# Patient Record
Sex: Female | Born: 1945 | Race: White | Hispanic: No | Marital: Married | Smoking: Current every day smoker
Health system: Southern US, Community
[De-identification: ages and names within clinical notes are randomized; demographics above are authoritative.]

## PROBLEM LIST (undated history)

## (undated) DIAGNOSIS — Z789 Other specified health status: Secondary | ICD-10-CM

## (undated) HISTORY — PX: FOOT SURGERY: SHX648

## (undated) HISTORY — PX: BACK SURGERY: SHX140

---

## 1999-08-22 ENCOUNTER — Other Ambulatory Visit: Admission: RE | Admit: 1999-08-22 | Discharge: 1999-08-22 | Payer: Self-pay | Admitting: Obstetrics and Gynecology

## 2001-03-04 ENCOUNTER — Other Ambulatory Visit: Admission: RE | Admit: 2001-03-04 | Discharge: 2001-03-04 | Payer: Self-pay | Admitting: Obstetrics and Gynecology

## 2002-02-18 ENCOUNTER — Other Ambulatory Visit: Admission: RE | Admit: 2002-02-18 | Discharge: 2002-02-18 | Payer: Self-pay | Admitting: Obstetrics and Gynecology

## 2003-05-13 ENCOUNTER — Other Ambulatory Visit: Admission: RE | Admit: 2003-05-13 | Discharge: 2003-05-13 | Payer: Self-pay | Admitting: Obstetrics and Gynecology

## 2003-12-19 ENCOUNTER — Ambulatory Visit (HOSPITAL_COMMUNITY): Admission: RE | Admit: 2003-12-19 | Discharge: 2003-12-19 | Payer: Self-pay | Admitting: Neurosurgery

## 2006-03-26 ENCOUNTER — Other Ambulatory Visit: Admission: RE | Admit: 2006-03-26 | Discharge: 2006-03-26 | Payer: Self-pay | Admitting: Obstetrics and Gynecology

## 2008-07-08 ENCOUNTER — Other Ambulatory Visit: Admission: RE | Admit: 2008-07-08 | Discharge: 2008-07-08 | Payer: Self-pay | Admitting: Internal Medicine

## 2012-08-12 ENCOUNTER — Other Ambulatory Visit: Payer: Self-pay | Admitting: Chiropractic Medicine

## 2012-08-12 DIAGNOSIS — M5430 Sciatica, unspecified side: Secondary | ICD-10-CM

## 2012-08-17 ENCOUNTER — Ambulatory Visit
Admission: RE | Admit: 2012-08-17 | Discharge: 2012-08-17 | Disposition: A | Discharge status: Home / Self Care | Payer: BC Managed Care – PPO | Source: Ambulatory Visit | Attending: Chiropractic Medicine | Admitting: Chiropractic Medicine

## 2012-08-17 DIAGNOSIS — M5430 Sciatica, unspecified side: Secondary | ICD-10-CM

## 2012-08-17 MED ORDER — GADOBENATE DIMEGLUMINE 529 MG/ML IV SOLN
17.0000 mL | Freq: Once | INTRAVENOUS | Status: AC | PRN
  Administered 2012-08-17: 17 mL via INTRAVENOUS

## 2013-09-22 ENCOUNTER — Ambulatory Visit (INDEPENDENT_AMBULATORY_CARE_PROVIDER_SITE_OTHER): Payer: BC Managed Care – PPO

## 2013-09-22 ENCOUNTER — Ambulatory Visit: Payer: Self-pay

## 2013-09-22 ENCOUNTER — Ambulatory Visit (INDEPENDENT_AMBULATORY_CARE_PROVIDER_SITE_OTHER): Payer: BC Managed Care – PPO | Admitting: Podiatry

## 2013-09-22 VITALS — BP 134/74 | HR 77 | Resp 16 | Wt 206.0 lb

## 2013-09-22 DIAGNOSIS — M2010 Hallux valgus (acquired), unspecified foot: Secondary | ICD-10-CM

## 2013-09-22 DIAGNOSIS — Z9889 Other specified postprocedural states: Secondary | ICD-10-CM

## 2013-09-22 DIAGNOSIS — M21611 Bunion of right foot: Secondary | ICD-10-CM

## 2013-09-22 DIAGNOSIS — M21619 Bunion of unspecified foot: Secondary | ICD-10-CM

## 2013-09-22 NOTE — Progress Notes (Signed)
Subjective:     Patient ID: Kelsey Jacobs, female   DOB: 02/11/1946, 67 y.o.   MRN: [id number]  HPI patient states that her right foot is doing well with swelling still noted when she stands too long but able to wear shoe gear comfortably   Review of Systems  All other systems reviewed and are negative.       Objective:   Physical Exam  Nursing note and vitals reviewed. Constitutional: She appears well-developed and well-nourished.  Cardiovascular: Intact distal pulses.   Neurological: She is alert.   right foot has moderate edema consistent with this. Postop with good range of motion and no structural deformity noted. Left foot shows structural deformity of the MPJ    Assessment:     Well-healed surgical site right with good clinical picture and structural deformity of the left first metatarsal and distal third and fourth toes    Plan:     Reviewed x-rays and advised on moderate activity for the right foot and discussed correction of the left foot to be done in January

## 2013-11-22 ENCOUNTER — Encounter: Payer: BC Managed Care – PPO | Admitting: Podiatry

## 2013-12-06 ENCOUNTER — Encounter: Payer: Self-pay | Admitting: Podiatry

## 2013-12-06 ENCOUNTER — Ambulatory Visit (INDEPENDENT_AMBULATORY_CARE_PROVIDER_SITE_OTHER): Payer: BC Managed Care – PPO

## 2013-12-06 ENCOUNTER — Ambulatory Visit (INDEPENDENT_AMBULATORY_CARE_PROVIDER_SITE_OTHER): Payer: BC Managed Care – PPO | Admitting: Podiatry

## 2013-12-06 VITALS — BP 164/84 | HR 71 | Resp 16

## 2013-12-06 DIAGNOSIS — L84 Corns and callosities: Secondary | ICD-10-CM

## 2013-12-06 DIAGNOSIS — M2010 Hallux valgus (acquired), unspecified foot: Secondary | ICD-10-CM

## 2013-12-06 DIAGNOSIS — Z9889 Other specified postprocedural states: Secondary | ICD-10-CM

## 2013-12-06 NOTE — Progress Notes (Signed)
Subjective:     Patient ID: Paulene Floor, female   DOB: 01/02/1946, 67 y.o.   MRN: [id number]  HPI patient states my right foot doing very well and I want to fix my left foot sometime in the summer but I have this lesion underneath it very tender on the left foot and I wanted to get that checked.   Review of Systems     Objective:   Physical Exam Neurovascular status intact with no health history changes noted and well-healing surgical site right with reasonably good clinical alignment no pain good motion and reduction of callus tissue. Left shows structural malalignment of the forefoot with digital deformity structural bunion deformity and pain with keratotic lesion around the second metatarsal left    Assessment:     Healing well from surgery right even though I was not able to get complete radiographic healing and structural malalignment of the left with lesion formation and pain    Plan:     Reviewed x-rays of both conditions and debrided lesion on the left. Discussed that I would like to use internal pins on the second and third toes but I do think it's important to stabilize them to try to prevent deformity underneath the metatarsal with lesion. For the right foot resume normal activities

## 2014-04-20 ENCOUNTER — Ambulatory Visit: Payer: BC Managed Care – PPO | Admitting: Podiatry

## 2014-04-27 ENCOUNTER — Ambulatory Visit (INDEPENDENT_AMBULATORY_CARE_PROVIDER_SITE_OTHER): Payer: BC Managed Care – PPO | Admitting: Podiatry

## 2014-04-27 ENCOUNTER — Ambulatory Visit (INDEPENDENT_AMBULATORY_CARE_PROVIDER_SITE_OTHER): Payer: BC Managed Care – PPO

## 2014-04-27 ENCOUNTER — Encounter: Payer: Self-pay | Admitting: Podiatry

## 2014-04-27 VITALS — BP 131/80 | HR 87 | Resp 16

## 2014-04-27 DIAGNOSIS — L851 Acquired keratosis [keratoderma] palmaris et plantaris: Secondary | ICD-10-CM

## 2014-04-27 DIAGNOSIS — M2040 Other hammer toe(s) (acquired), unspecified foot: Secondary | ICD-10-CM

## 2014-04-27 DIAGNOSIS — R52 Pain, unspecified: Secondary | ICD-10-CM

## 2014-04-27 DIAGNOSIS — M2010 Hallux valgus (acquired), unspecified foot: Secondary | ICD-10-CM

## 2014-04-27 MED ORDER — DIAZEPAM 10 MG PO TABS: 10.0000 mg | ORAL_TABLET | Freq: Two times a day (BID) | ORAL | Status: DC | PRN

## 2014-04-27 NOTE — Progress Notes (Signed)
   Subjective:    Patient ID: Kelsey Jacobs, female    DOB: 13-Dec-1945, 68 y.o.   MRN: [id number]  HPI  Pain in left foot, surgical consult.    Review of Systems     Objective:   Physical Exam        Assessment & Plan:

## 2014-04-27 NOTE — Progress Notes (Signed)
Subjective:     Patient ID: Kelsey Jacobs, female   DOB: 08/21/1946, 68 y.o.   MRN: [id number]  HPI patient presents stating I am ready to set up surgery for my left foot and i'm feeling very good with my right foot   Review of Systems     Objective:   Physical Exam Neurovascular status intact with no health history changes noted and patient found to have a large structural bunion deformity left with redness digital deformity of digits 23 and 4 and plantar keratotic lesion left second metatarsal but painful. 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 Discussed surgery and recommended Austin osteotomy with in fixation digital fusion digits 23 left distal arthroplasty 4 left and removal of the plantar lesion left with wide excision. Explained that is a possibility the lesion will come back and I can give no guarantees for success of surgery allowing her to read a consent form reviewing what would be done and all complications as outlined in the consent form. Patient understands total surgery will take 4-6 months

## 2014-05-18 ENCOUNTER — Telehealth: Payer: Self-pay | Admitting: *Deleted

## 2014-05-18 NOTE — Telephone Encounter (Signed)
Surgery scheduled for 06/28/2014.  I need to change that.  Give me a call.  I'd appreciate it.  I attempted to return her call.  I left her a message on her work number to call me back on tomorrow.  I also attempted to call her home number, no voicemail.

## 2014-05-19 NOTE — Telephone Encounter (Signed)
I called the patient and asked if she wanted to reschedule her surgery.  She said she wanted to wait until the winter because she had the other one done when it was hot.  Don't want to go through that again.  She said she wants to have it done in January.  I told her to give [place] a call at the end of November and probably schedule a follow-up appointment.  She stated she would do that.

## 2014-07-04 ENCOUNTER — Encounter: Payer: BC Managed Care – PPO | Admitting: Podiatry

## 2016-05-29 ENCOUNTER — Ambulatory Visit (INDEPENDENT_AMBULATORY_CARE_PROVIDER_SITE_OTHER): Payer: Commercial Managed Care - HMO

## 2016-05-29 ENCOUNTER — Ambulatory Visit (INDEPENDENT_AMBULATORY_CARE_PROVIDER_SITE_OTHER): Payer: Commercial Managed Care - HMO | Admitting: Podiatry

## 2016-05-29 ENCOUNTER — Encounter: Payer: Self-pay | Admitting: Podiatry

## 2016-05-29 VITALS — BP 142/84 | HR 74 | Resp 16

## 2016-05-29 DIAGNOSIS — M2040 Other hammer toe(s) (acquired), unspecified foot: Secondary | ICD-10-CM

## 2016-05-29 DIAGNOSIS — M779 Enthesopathy, unspecified: Secondary | ICD-10-CM

## 2016-05-29 MED ORDER — TRIAMCINOLONE ACETONIDE 10 MG/ML IJ SUSP
10.0000 mg | Freq: Once | INTRAMUSCULAR | Status: AC
  Administered 2016-05-29: 10 mg

## 2016-05-29 NOTE — Progress Notes (Signed)
Subjective:     Patient ID: Kelsey Jacobs M Jacobs, female   DOB: 01-17-1946, 70 y.o.   MRN: [id number]  HPI patient points to the outside of the right foot stating that it's been hurting her for the last couple months and she does not remember injury. Overall is satisfied with the correction of her right foot was concerned about the position of the big toe   Review of Systems     Objective:   Physical Exam Neurovascular status found to be intact muscle strength was adequate with patient having a good structural correction of the right foot with excellent range of motion of the first MPJ. Patient's found on the lateral side of the foot to have pain in the dorsal tendon complex and fluid buildup. 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 Gave instructions on physical therapy.  X-ray report indicates that there is relative good correction of the first MPJ right with excellent correction of the second digit and no indication of the hallux coming up over the second toe

## 2016-07-03 ENCOUNTER — Ambulatory Visit: Payer: Commercial Managed Care - HMO | Admitting: Podiatry

## 2016-07-10 ENCOUNTER — Ambulatory Visit (INDEPENDENT_AMBULATORY_CARE_PROVIDER_SITE_OTHER): Payer: Commercial Managed Care - HMO | Admitting: Podiatry

## 2016-07-10 ENCOUNTER — Encounter: Payer: Self-pay | Admitting: Podiatry

## 2016-07-10 DIAGNOSIS — M779 Enthesopathy, unspecified: Secondary | ICD-10-CM | POA: Diagnosis not present

## 2016-07-10 DIAGNOSIS — M2040 Other hammer toe(s) (acquired), unspecified foot: Secondary | ICD-10-CM | POA: Diagnosis not present

## 2016-07-10 NOTE — Progress Notes (Signed)
Subjective:     Patient ID: Kelsey Jacobs, female   DOB: 06-Jul-1946, 70 y.o.   MRN: [id number]  HPI patient presents stating I'm doing fine with the area worked on but at times my big toe bothers me and I know that I do it myself   Review of Systems     Objective:   Physical Exam Neurovascular status intact muscle strength adequate with patient found to have improvement of the right lateral foot with diminishment of discomfort when palpated. The right hallux does tend to move up and slightly over and she only gets this when she activates her extensor tendon as a nervous habit    Assessment:     Doing well from tendinitis right with a mild over activity of the extensor tendon right with the hallux that at times comes over the second toe but when she relaxes it it's in good position    Plan:     Reviewed condition and do not recommend procedure on the big toe and less it were to get worse or we may have to consider fusion or other procedures. Tendinitis seems to be under control and will be seen back as needed

## 2017-09-12 DIAGNOSIS — S82831A Other fracture of upper and lower end of right fibula, initial encounter for closed fracture: Secondary | ICD-10-CM | POA: Diagnosis not present

## 2017-09-19 DIAGNOSIS — S82831D Other fracture of upper and lower end of right fibula, subsequent encounter for closed fracture with routine healing: Secondary | ICD-10-CM | POA: Diagnosis not present

## 2017-10-06 DIAGNOSIS — S82831D Other fracture of upper and lower end of right fibula, subsequent encounter for closed fracture with routine healing: Secondary | ICD-10-CM | POA: Diagnosis not present

## 2017-10-27 DIAGNOSIS — S82831D Other fracture of upper and lower end of right fibula, subsequent encounter for closed fracture with routine healing: Secondary | ICD-10-CM | POA: Diagnosis not present

## 2017-11-24 DIAGNOSIS — S82831D Other fracture of upper and lower end of right fibula, subsequent encounter for closed fracture with routine healing: Secondary | ICD-10-CM | POA: Diagnosis not present

## 2017-12-15 DIAGNOSIS — S82831D Other fracture of upper and lower end of right fibula, subsequent encounter for closed fracture with routine healing: Secondary | ICD-10-CM | POA: Diagnosis not present

## 2019-02-05 DIAGNOSIS — M4326 Fusion of spine, lumbar region: Secondary | ICD-10-CM | POA: Diagnosis not present

## 2019-02-05 DIAGNOSIS — M9903 Segmental and somatic dysfunction of lumbar region: Secondary | ICD-10-CM | POA: Diagnosis not present

## 2019-02-05 DIAGNOSIS — M7918 Myalgia, other site: Secondary | ICD-10-CM | POA: Diagnosis not present

## 2019-02-05 DIAGNOSIS — M5416 Radiculopathy, lumbar region: Secondary | ICD-10-CM | POA: Diagnosis not present

## 2019-02-05 DIAGNOSIS — M4726 Other spondylosis with radiculopathy, lumbar region: Secondary | ICD-10-CM | POA: Diagnosis not present

## 2019-02-05 DIAGNOSIS — M9904 Segmental and somatic dysfunction of sacral region: Secondary | ICD-10-CM | POA: Diagnosis not present

## 2019-02-05 DIAGNOSIS — M545 Low back pain: Secondary | ICD-10-CM | POA: Diagnosis not present

## 2019-02-05 DIAGNOSIS — M9905 Segmental and somatic dysfunction of pelvic region: Secondary | ICD-10-CM | POA: Diagnosis not present

## 2019-02-12 DIAGNOSIS — M5416 Radiculopathy, lumbar region: Secondary | ICD-10-CM | POA: Diagnosis not present

## 2019-02-12 DIAGNOSIS — M9904 Segmental and somatic dysfunction of sacral region: Secondary | ICD-10-CM | POA: Diagnosis not present

## 2019-02-12 DIAGNOSIS — M9903 Segmental and somatic dysfunction of lumbar region: Secondary | ICD-10-CM | POA: Diagnosis not present

## 2019-02-12 DIAGNOSIS — M4326 Fusion of spine, lumbar region: Secondary | ICD-10-CM | POA: Diagnosis not present

## 2019-02-12 DIAGNOSIS — M7918 Myalgia, other site: Secondary | ICD-10-CM | POA: Diagnosis not present

## 2019-02-12 DIAGNOSIS — M9905 Segmental and somatic dysfunction of pelvic region: Secondary | ICD-10-CM | POA: Diagnosis not present

## 2019-02-12 DIAGNOSIS — M4726 Other spondylosis with radiculopathy, lumbar region: Secondary | ICD-10-CM | POA: Diagnosis not present

## 2019-02-12 DIAGNOSIS — M545 Low back pain: Secondary | ICD-10-CM | POA: Diagnosis not present

## 2019-12-29 DIAGNOSIS — R5382 Chronic fatigue, unspecified: Secondary | ICD-10-CM | POA: Diagnosis not present

## 2019-12-29 DIAGNOSIS — H539 Unspecified visual disturbance: Secondary | ICD-10-CM | POA: Diagnosis not present

## 2019-12-29 DIAGNOSIS — J301 Allergic rhinitis due to pollen: Secondary | ICD-10-CM | POA: Diagnosis not present

## 2019-12-29 DIAGNOSIS — Z9189 Other specified personal risk factors, not elsewhere classified: Secondary | ICD-10-CM | POA: Diagnosis not present

## 2019-12-29 DIAGNOSIS — R030 Elevated blood-pressure reading, without diagnosis of hypertension: Secondary | ICD-10-CM | POA: Diagnosis not present

## 2019-12-29 DIAGNOSIS — Z6841 Body Mass Index (BMI) 40.0 and over, adult: Secondary | ICD-10-CM | POA: Diagnosis not present

## 2019-12-29 DIAGNOSIS — M545 Low back pain: Secondary | ICD-10-CM | POA: Diagnosis not present

## 2019-12-29 DIAGNOSIS — G8929 Other chronic pain: Secondary | ICD-10-CM | POA: Diagnosis not present

## 2019-12-29 DIAGNOSIS — F17209 Nicotine dependence, unspecified, with unspecified nicotine-induced disorders: Secondary | ICD-10-CM | POA: Diagnosis not present

## 2019-12-29 DIAGNOSIS — Z1211 Encounter for screening for malignant neoplasm of colon: Secondary | ICD-10-CM | POA: Diagnosis not present

## 2020-01-05 DIAGNOSIS — Z122 Encounter for screening for malignant neoplasm of respiratory organs: Secondary | ICD-10-CM | POA: Diagnosis not present

## 2020-01-05 DIAGNOSIS — R911 Solitary pulmonary nodule: Secondary | ICD-10-CM | POA: Diagnosis not present

## 2020-01-05 DIAGNOSIS — R5382 Chronic fatigue, unspecified: Secondary | ICD-10-CM | POA: Diagnosis not present

## 2020-01-05 DIAGNOSIS — Z6841 Body Mass Index (BMI) 40.0 and over, adult: Secondary | ICD-10-CM | POA: Diagnosis not present

## 2020-01-05 DIAGNOSIS — R030 Elevated blood-pressure reading, without diagnosis of hypertension: Secondary | ICD-10-CM | POA: Diagnosis not present

## 2020-01-05 DIAGNOSIS — F17210 Nicotine dependence, cigarettes, uncomplicated: Secondary | ICD-10-CM | POA: Diagnosis not present

## 2020-01-06 DIAGNOSIS — Z23 Encounter for immunization: Secondary | ICD-10-CM | POA: Diagnosis not present

## 2020-01-12 DIAGNOSIS — L821 Other seborrheic keratosis: Secondary | ICD-10-CM | POA: Diagnosis not present

## 2020-01-12 DIAGNOSIS — L720 Epidermal cyst: Secondary | ICD-10-CM | POA: Diagnosis not present

## 2020-01-12 DIAGNOSIS — Z85828 Personal history of other malignant neoplasm of skin: Secondary | ICD-10-CM | POA: Diagnosis not present

## 2020-01-14 DIAGNOSIS — R0681 Apnea, not elsewhere classified: Secondary | ICD-10-CM | POA: Diagnosis not present

## 2020-01-14 DIAGNOSIS — G4730 Sleep apnea, unspecified: Secondary | ICD-10-CM | POA: Diagnosis not present

## 2020-01-14 DIAGNOSIS — R0683 Snoring: Secondary | ICD-10-CM | POA: Diagnosis not present

## 2020-01-14 DIAGNOSIS — Z9189 Other specified personal risk factors, not elsewhere classified: Secondary | ICD-10-CM | POA: Diagnosis not present

## 2020-01-14 DIAGNOSIS — Z6841 Body Mass Index (BMI) 40.0 and over, adult: Secondary | ICD-10-CM | POA: Diagnosis not present

## 2020-02-01 DIAGNOSIS — Z23 Encounter for immunization: Secondary | ICD-10-CM | POA: Diagnosis not present

## 2020-02-08 DIAGNOSIS — H5203 Hypermetropia, bilateral: Secondary | ICD-10-CM | POA: Diagnosis not present

## 2020-02-08 DIAGNOSIS — H0288A Meibomian gland dysfunction right eye, upper and lower eyelids: Secondary | ICD-10-CM | POA: Diagnosis not present

## 2020-02-08 DIAGNOSIS — H25813 Combined forms of age-related cataract, bilateral: Secondary | ICD-10-CM | POA: Diagnosis not present

## 2020-02-08 DIAGNOSIS — H1045 Other chronic allergic conjunctivitis: Secondary | ICD-10-CM | POA: Diagnosis not present

## 2020-02-08 DIAGNOSIS — H0288B Meibomian gland dysfunction left eye, upper and lower eyelids: Secondary | ICD-10-CM | POA: Diagnosis not present

## 2020-02-08 DIAGNOSIS — H52223 Regular astigmatism, bilateral: Secondary | ICD-10-CM | POA: Diagnosis not present

## 2020-02-08 DIAGNOSIS — H04123 Dry eye syndrome of bilateral lacrimal glands: Secondary | ICD-10-CM | POA: Diagnosis not present

## 2020-02-08 DIAGNOSIS — H524 Presbyopia: Secondary | ICD-10-CM | POA: Diagnosis not present

## 2020-02-11 DIAGNOSIS — Z1211 Encounter for screening for malignant neoplasm of colon: Secondary | ICD-10-CM | POA: Diagnosis not present

## 2020-02-11 DIAGNOSIS — G4730 Sleep apnea, unspecified: Secondary | ICD-10-CM | POA: Diagnosis not present

## 2020-02-11 DIAGNOSIS — Z01812 Encounter for preprocedural laboratory examination: Secondary | ICD-10-CM | POA: Diagnosis not present

## 2020-02-11 DIAGNOSIS — Z20822 Contact with and (suspected) exposure to covid-19: Secondary | ICD-10-CM | POA: Diagnosis not present

## 2020-02-17 DIAGNOSIS — G4733 Obstructive sleep apnea (adult) (pediatric): Secondary | ICD-10-CM | POA: Diagnosis not present

## 2020-03-02 DIAGNOSIS — G4733 Obstructive sleep apnea (adult) (pediatric): Secondary | ICD-10-CM | POA: Diagnosis not present

## 2020-03-03 DIAGNOSIS — K6389 Other specified diseases of intestine: Secondary | ICD-10-CM | POA: Diagnosis not present

## 2020-03-03 DIAGNOSIS — K219 Gastro-esophageal reflux disease without esophagitis: Secondary | ICD-10-CM | POA: Diagnosis not present

## 2020-03-03 DIAGNOSIS — K635 Polyp of colon: Secondary | ICD-10-CM | POA: Diagnosis not present

## 2020-03-03 DIAGNOSIS — K5730 Diverticulosis of large intestine without perforation or abscess without bleeding: Secondary | ICD-10-CM | POA: Diagnosis not present

## 2020-03-03 DIAGNOSIS — F17210 Nicotine dependence, cigarettes, uncomplicated: Secondary | ICD-10-CM | POA: Diagnosis not present

## 2020-03-03 DIAGNOSIS — Z1211 Encounter for screening for malignant neoplasm of colon: Secondary | ICD-10-CM | POA: Diagnosis not present

## 2020-03-03 DIAGNOSIS — Z86010 Personal history of colonic polyps: Secondary | ICD-10-CM | POA: Diagnosis not present

## 2020-04-27 DIAGNOSIS — G4733 Obstructive sleep apnea (adult) (pediatric): Secondary | ICD-10-CM | POA: Diagnosis not present

## 2020-04-28 DIAGNOSIS — G4733 Obstructive sleep apnea (adult) (pediatric): Secondary | ICD-10-CM | POA: Diagnosis not present

## 2020-06-28 DIAGNOSIS — E782 Mixed hyperlipidemia: Secondary | ICD-10-CM | POA: Diagnosis not present

## 2020-06-28 DIAGNOSIS — M21622 Bunionette of left foot: Secondary | ICD-10-CM | POA: Diagnosis not present

## 2020-06-28 DIAGNOSIS — M2042 Other hammer toe(s) (acquired), left foot: Secondary | ICD-10-CM | POA: Diagnosis not present

## 2020-06-28 DIAGNOSIS — F17209 Nicotine dependence, unspecified, with unspecified nicotine-induced disorders: Secondary | ICD-10-CM | POA: Diagnosis not present

## 2020-06-28 DIAGNOSIS — R5382 Chronic fatigue, unspecified: Secondary | ICD-10-CM | POA: Diagnosis not present

## 2020-06-28 DIAGNOSIS — M21621 Bunionette of right foot: Secondary | ICD-10-CM | POA: Diagnosis not present

## 2020-06-28 DIAGNOSIS — R739 Hyperglycemia, unspecified: Secondary | ICD-10-CM | POA: Diagnosis not present

## 2020-06-28 DIAGNOSIS — Z1231 Encounter for screening mammogram for malignant neoplasm of breast: Secondary | ICD-10-CM | POA: Diagnosis not present

## 2020-07-06 DIAGNOSIS — Z1231 Encounter for screening mammogram for malignant neoplasm of breast: Secondary | ICD-10-CM | POA: Diagnosis not present

## 2020-07-12 DIAGNOSIS — G4733 Obstructive sleep apnea (adult) (pediatric): Secondary | ICD-10-CM | POA: Diagnosis not present

## 2020-07-12 DIAGNOSIS — Z9989 Dependence on other enabling machines and devices: Secondary | ICD-10-CM | POA: Diagnosis not present

## 2020-07-18 DIAGNOSIS — G4733 Obstructive sleep apnea (adult) (pediatric): Secondary | ICD-10-CM | POA: Insufficient documentation

## 2020-07-25 DIAGNOSIS — R928 Other abnormal and inconclusive findings on diagnostic imaging of breast: Secondary | ICD-10-CM | POA: Diagnosis not present

## 2020-07-25 DIAGNOSIS — N6012 Diffuse cystic mastopathy of left breast: Secondary | ICD-10-CM | POA: Diagnosis not present

## 2020-08-10 DIAGNOSIS — M2012 Hallux valgus (acquired), left foot: Secondary | ICD-10-CM | POA: Diagnosis not present

## 2020-08-10 DIAGNOSIS — M21622 Bunionette of left foot: Secondary | ICD-10-CM | POA: Diagnosis not present

## 2020-08-10 DIAGNOSIS — M2042 Other hammer toe(s) (acquired), left foot: Secondary | ICD-10-CM | POA: Diagnosis not present

## 2020-08-10 DIAGNOSIS — M19072 Primary osteoarthritis, left ankle and foot: Secondary | ICD-10-CM | POA: Diagnosis not present

## 2020-08-10 DIAGNOSIS — M19071 Primary osteoarthritis, right ankle and foot: Secondary | ICD-10-CM | POA: Diagnosis not present

## 2020-08-10 DIAGNOSIS — M2011 Hallux valgus (acquired), right foot: Secondary | ICD-10-CM | POA: Diagnosis not present

## 2020-08-10 DIAGNOSIS — M7731 Calcaneal spur, right foot: Secondary | ICD-10-CM | POA: Diagnosis not present

## 2020-08-10 DIAGNOSIS — M21621 Bunionette of right foot: Secondary | ICD-10-CM | POA: Diagnosis not present

## 2020-08-10 DIAGNOSIS — M7732 Calcaneal spur, left foot: Secondary | ICD-10-CM | POA: Diagnosis not present

## 2020-10-12 DIAGNOSIS — G4733 Obstructive sleep apnea (adult) (pediatric): Secondary | ICD-10-CM | POA: Diagnosis not present

## 2020-10-12 DIAGNOSIS — Z9989 Dependence on other enabling machines and devices: Secondary | ICD-10-CM | POA: Diagnosis not present

## 2020-11-22 DIAGNOSIS — Z23 Encounter for immunization: Secondary | ICD-10-CM | POA: Diagnosis not present

## 2021-02-07 ENCOUNTER — Other Ambulatory Visit: Payer: Self-pay | Admitting: Physical Medicine and Rehabilitation

## 2021-02-07 DIAGNOSIS — R29898 Other symptoms and signs involving the musculoskeletal system: Secondary | ICD-10-CM

## 2021-02-07 DIAGNOSIS — R200 Anesthesia of skin: Secondary | ICD-10-CM

## 2021-03-04 ENCOUNTER — Other Ambulatory Visit: Payer: Self-pay

## 2021-03-16 ENCOUNTER — Ambulatory Visit
Admission: RE | Admit: 2021-03-16 | Discharge: 2021-03-16 | Disposition: A | Discharge status: Home / Self Care | Payer: Medicare Other | Source: Ambulatory Visit | Attending: Physical Medicine and Rehabilitation | Admitting: Physical Medicine and Rehabilitation

## 2021-03-16 ENCOUNTER — Other Ambulatory Visit: Payer: Self-pay

## 2021-03-16 DIAGNOSIS — R29898 Other symptoms and signs involving the musculoskeletal system: Secondary | ICD-10-CM

## 2021-03-16 DIAGNOSIS — R200 Anesthesia of skin: Secondary | ICD-10-CM

## 2021-03-16 DIAGNOSIS — R202 Paresthesia of skin: Secondary | ICD-10-CM

## 2021-03-29 ENCOUNTER — Other Ambulatory Visit: Payer: Self-pay

## 2021-03-29 ENCOUNTER — Inpatient Hospital Stay (HOSPITAL_BASED_OUTPATIENT_CLINIC_OR_DEPARTMENT_OTHER)
Admission: EM | Admit: 2021-03-29 | Discharge: 2021-03-31 | DRG: 603 | Disposition: A | Discharge status: Home Health | Payer: Medicare Other | Attending: Surgery | Admitting: Surgery

## 2021-03-29 ENCOUNTER — Encounter (HOSPITAL_COMMUNITY): Admission: EM | Disposition: A | Discharge status: Home Health | Payer: Self-pay | Source: Home / Self Care

## 2021-03-29 ENCOUNTER — Emergency Department (HOSPITAL_COMMUNITY): Payer: Medicare Other | Admitting: Anesthesiology

## 2021-03-29 ENCOUNTER — Encounter (HOSPITAL_BASED_OUTPATIENT_CLINIC_OR_DEPARTMENT_OTHER): Payer: Self-pay | Admitting: *Deleted

## 2021-03-29 DIAGNOSIS — Z20822 Contact with and (suspected) exposure to covid-19: Secondary | ICD-10-CM | POA: Diagnosis present

## 2021-03-29 DIAGNOSIS — B9562 Methicillin resistant Staphylococcus aureus infection as the cause of diseases classified elsewhere: Secondary | ICD-10-CM | POA: Diagnosis present

## 2021-03-29 DIAGNOSIS — L02419 Cutaneous abscess of limb, unspecified: Secondary | ICD-10-CM | POA: Diagnosis present

## 2021-03-29 DIAGNOSIS — L03112 Cellulitis of left axilla: Secondary | ICD-10-CM | POA: Diagnosis present

## 2021-03-29 DIAGNOSIS — F17210 Nicotine dependence, cigarettes, uncomplicated: Secondary | ICD-10-CM | POA: Diagnosis present

## 2021-03-29 DIAGNOSIS — L02219 Cutaneous abscess of trunk, unspecified: Principal | ICD-10-CM

## 2021-03-29 DIAGNOSIS — L02412 Cutaneous abscess of left axilla: Principal | ICD-10-CM | POA: Diagnosis present

## 2021-03-29 DIAGNOSIS — E871 Hypo-osmolality and hyponatremia: Secondary | ICD-10-CM | POA: Diagnosis present

## 2021-03-29 DIAGNOSIS — L03319 Cellulitis of trunk, unspecified: Secondary | ICD-10-CM

## 2021-03-29 HISTORY — PX: INCISION AND DRAINAGE ABSCESS: SHX5864

## 2021-03-29 HISTORY — DX: Other specified health status: Z78.9

## 2021-03-29 LAB — CBC WITH DIFFERENTIAL/PLATELET
Abs Immature Granulocytes: 0.06 10*3/uL (ref 0.00–0.07)
Basophils Absolute: 0 10*3/uL (ref 0.0–0.1)
Basophils Relative: 0 %
Eosinophils Absolute: 0.2 10*3/uL (ref 0.0–0.5)
Eosinophils Relative: 3 %
HCT: 41.2 % (ref 36.0–46.0)
Hemoglobin: 13.9 g/dL (ref 12.0–15.0)
Immature Granulocytes: 1 %
Lymphocytes Relative: 15 %
Lymphs Abs: 1.3 10*3/uL (ref 0.7–4.0)
MCH: 30.8 pg (ref 26.0–34.0)
MCHC: 33.7 g/dL (ref 30.0–36.0)
MCV: 91.2 fL (ref 80.0–100.0)
Monocytes Absolute: 0.8 10*3/uL (ref 0.1–1.0)
Monocytes Relative: 8 %
Neutro Abs: 6.6 10*3/uL (ref 1.7–7.7)
Neutrophils Relative %: 73 %
Platelets: 196 10*3/uL (ref 150–400)
RBC: 4.52 MIL/uL (ref 3.87–5.11)
RDW: 13.2 % (ref 11.5–15.5)
WBC: 9 10*3/uL (ref 4.0–10.5)
nRBC: 0 % (ref 0.0–0.2)

## 2021-03-29 LAB — RESP PANEL BY RT-PCR (FLU A&B, COVID) ARPGX2
Influenza A by PCR: NEGATIVE
Influenza B by PCR: NEGATIVE
SARS Coronavirus 2 by RT PCR: NEGATIVE

## 2021-03-29 LAB — BASIC METABOLIC PANEL
Anion gap: 12 (ref 5–15)
BUN: 15 mg/dL (ref 8–23)
CO2: 20 mmol/L — ABNORMAL LOW (ref 22–32)
Calcium: 9.2 mg/dL (ref 8.9–10.3)
Chloride: 97 mmol/L — ABNORMAL LOW (ref 98–111)
Creatinine, Ser: 0.75 mg/dL (ref 0.44–1.00)
GFR, Estimated: >60 mL/min (ref >60)
Glucose, Bld: 101 mg/dL — ABNORMAL HIGH (ref 70–99)
Potassium: 3.8 mmol/L (ref 3.5–5.1)
Sodium: 129 mmol/L — ABNORMAL LOW (ref 135–145)

## 2021-03-29 SURGERY — INCISION AND DRAINAGE, ABSCESS
Anesthesia: General | Laterality: Left

## 2021-03-29 MED ORDER — LACTATED RINGERS IV SOLN: INTRAVENOUS | Status: DC | PRN

## 2021-03-29 MED ORDER — FENTANYL CITRATE (PF) 100 MCG/2ML IJ SOLN
INTRAMUSCULAR | Status: AC
  Filled 2021-03-29: qty 2

## 2021-03-29 MED ORDER — BUPIVACAINE-EPINEPHRINE 0.25% -1:200000 IJ SOLN
INTRAMUSCULAR | Status: DC | PRN
  Administered 2021-03-29: 10 mL

## 2021-03-29 MED ORDER — BUPIVACAINE-EPINEPHRINE (PF) 0.25% -1:200000 IJ SOLN
INTRAMUSCULAR | Status: AC
  Filled 2021-03-29: qty 30

## 2021-03-29 MED ORDER — FENTANYL CITRATE (PF) 100 MCG/2ML IJ SOLN
INTRAMUSCULAR | Status: DC | PRN
  Administered 2021-03-29: 100 ug via INTRAVENOUS

## 2021-03-29 MED ORDER — MORPHINE SULFATE (PF) 4 MG/ML IV SOLN: 4.0000 mg | INTRAVENOUS | Status: DC | PRN

## 2021-03-29 MED ORDER — SODIUM CHLORIDE 0.9 % IV SOLN: INTRAVENOUS | Status: DC | PRN

## 2021-03-29 MED ORDER — LIDOCAINE 2% (20 MG/ML) 5 ML SYRINGE
INTRAMUSCULAR | Status: DC | PRN
  Administered 2021-03-29: 80 mg via INTRAVENOUS

## 2021-03-29 MED ORDER — ONDANSETRON HCL 4 MG/2ML IJ SOLN
4.0000 mg | Freq: Once | INTRAMUSCULAR | Status: AC
  Administered 2021-03-29: 4 mg via INTRAVENOUS
  Filled 2021-03-29: qty 2

## 2021-03-29 MED ORDER — VANCOMYCIN HCL 2000 MG/400ML IV SOLN
2000.0000 mg | Freq: Once | INTRAVENOUS | Status: DC
  Filled 2021-03-29: qty 400

## 2021-03-29 MED ORDER — DEXAMETHASONE SODIUM PHOSPHATE 10 MG/ML IJ SOLN
INTRAMUSCULAR | Status: DC | PRN
  Administered 2021-03-29: 10 mg via INTRAVENOUS

## 2021-03-29 MED ORDER — VANCOMYCIN HCL IN DEXTROSE 1-5 GM/200ML-% IV SOLN: 1000.0000 mg | INTRAVENOUS | Status: DC

## 2021-03-29 MED ORDER — PROPOFOL 10 MG/ML IV BOLUS
INTRAVENOUS | Status: AC
  Filled 2021-03-29: qty 20

## 2021-03-29 MED ORDER — ONDANSETRON HCL 4 MG/2ML IJ SOLN: 4.0000 mg | Freq: Once | INTRAMUSCULAR | Status: DC | PRN

## 2021-03-29 MED ORDER — MORPHINE SULFATE (PF) 4 MG/ML IV SOLN
4.0000 mg | Freq: Once | INTRAVENOUS | Status: AC
  Administered 2021-03-29: 4 mg via INTRAVENOUS
  Filled 2021-03-29: qty 1

## 2021-03-29 MED ORDER — ONDANSETRON HCL 4 MG/2ML IJ SOLN
INTRAMUSCULAR | Status: DC | PRN
  Administered 2021-03-29: 4 mg via INTRAVENOUS

## 2021-03-29 MED ORDER — PROPOFOL 10 MG/ML IV BOLUS
INTRAVENOUS | Status: DC | PRN
  Administered 2021-03-29: 160 mg via INTRAVENOUS

## 2021-03-29 MED ORDER — FENTANYL CITRATE (PF) 100 MCG/2ML IJ SOLN
25.0000 ug | INTRAMUSCULAR | Status: DC | PRN
Start: 2021-03-29 — End: 2021-03-30
  Administered 2021-03-30 (×2): 50 ug via INTRAVENOUS

## 2021-03-29 MED ORDER — ACETAMINOPHEN 10 MG/ML IV SOLN
INTRAVENOUS | Status: DC | PRN
  Administered 2021-03-29: 1000 mg via INTRAVENOUS

## 2021-03-29 MED ORDER — ACETAMINOPHEN 10 MG/ML IV SOLN
INTRAVENOUS | Status: AC
  Filled 2021-03-29: qty 100

## 2021-03-29 MED ORDER — VANCOMYCIN HCL 1000 MG IV SOLR
INTRAVENOUS | Status: AC
  Administered 2021-03-29: 2000 mg
  Filled 2021-03-29: qty 2000

## 2021-03-29 SURGICAL SUPPLY — 31 items
BLADE SURG 15 STRL LF DISP TIS (BLADE) ×1 IMPLANT
BLADE SURG 15 STRL SS (BLADE) ×3
BNDG GAUZE ELAST 4 BULKY (GAUZE/BANDAGES/DRESSINGS) ×2 IMPLANT
COVER WAND RF STERILE (DRAPES) IMPLANT
DECANTER SPIKE VIAL GLASS SM (MISCELLANEOUS) ×2 IMPLANT
DRAPE HALF SHEET 70X43 (DRAPES) ×2 IMPLANT
DRAPE LAPAROSCOPIC ABDOMINAL (DRAPES) IMPLANT
DRAPE ORTHO SPLIT 77X108 STRL (DRAPES) ×3
DRAPE SURG ORHT 6 SPLT 77X108 (DRAPES) IMPLANT
DRSG PAD ABDOMINAL 8X10 ST (GAUZE/BANDAGES/DRESSINGS) ×4 IMPLANT
ELECT REM PT RETURN 15FT ADLT (MISCELLANEOUS) ×3 IMPLANT
GAUZE SPONGE 4X4 12PLY STRL (GAUZE/BANDAGES/DRESSINGS) IMPLANT
GLOVE SURG ENC MOIS LTX SZ7.5 (GLOVE) ×3 IMPLANT
GOWN STRL REUS W/TWL LRG LVL3 (GOWN DISPOSABLE) ×6 IMPLANT
KIT BASIN OR (CUSTOM PROCEDURE TRAY) ×3 IMPLANT
KIT TURNOVER KIT A (KITS) ×3 IMPLANT
NDL HYPO 25X1 1.5 SAFETY (NEEDLE) IMPLANT
NEEDLE HYPO 25X1 1.5 SAFETY (NEEDLE) IMPLANT
NS IRRIG 1000ML POUR BTL (IV SOLUTION) ×1 IMPLANT
PENCIL SMOKE EVACUATOR (MISCELLANEOUS) IMPLANT
SPONGE LAP 18X18 RF (DISPOSABLE) ×3 IMPLANT
SUT MNCRL AB 4-0 PS2 18 (SUTURE) IMPLANT
SUT VIC AB 3-0 SH 27 (SUTURE)
SUT VIC AB 3-0 SH 27XBRD (SUTURE) IMPLANT
SWAB COLLECTION DEVICE MRSA (MISCELLANEOUS) ×2 IMPLANT
SWAB CULTURE ESWAB REG 1ML (MISCELLANEOUS) ×2 IMPLANT
SYR BULB EAR ULCER 3OZ GRN STR (SYRINGE) ×1 IMPLANT
SYR CONTROL 10ML LL (SYRINGE) ×2 IMPLANT
TAPE CLOTH SURG 6X10 WHT LF (GAUZE/BANDAGES/DRESSINGS) ×2 IMPLANT
TOWEL OR 17X26 10 PK STRL BLUE (TOWEL DISPOSABLE) ×3 IMPLANT
YANKAUER SUCT BULB TIP NO VENT (SUCTIONS) ×1 IMPLANT

## 2021-03-29 NOTE — ED Notes (Addendum)
NPO OF PO FLUIDS: 1530HRS NPO OF PO SOLIDS: 1200HRS

## 2021-03-29 NOTE — H&P (Signed)
Kelsey Jacobs is an 75 y.o. female.   Chief Complaint: pain HPI: The patient is a 75 year old white female who has a history of recent infections in the left axilla.  She has been treated with Bactrim.  The infection seemed to resolve but over the last 5 days she has developed a new area lower in the left axilla that has become swollen and red and extremely painful.  She denies any fevers or chills.  Her white count is normal.  The cellulitis seems to be getting worse.  History reviewed. No pertinent past medical history.  Past Surgical History:  Procedure Laterality Date  . BACK SURGERY    . FOOT SURGERY      No family history on file. Social History:  reports that she has been smoking. She has been smoking about 1.00 pack per day. She does not have any smokeless tobacco history on file. No history on file for alcohol use and drug use.  Allergies: No Known Allergies  (Not in a hospital admission)   Results for orders placed or performed during the hospital encounter of 03/29/21 (from the past 48 hour(s))  CBC with Differential     Status: None   Collection Time: 03/29/21  6:00 PM  Result Value Ref Range   WBC 9.0 4.0 - 10.5 K/uL   RBC 4.52 3.87 - 5.11 MIL/uL   Hemoglobin 13.9 12.0 - 15.0 g/dL   HCT 60.4 54.0 - 98.1 %   MCV 91.2 80.0 - 100.0 fL   MCH 30.8 26.0 - 34.0 pg   MCHC 33.7 30.0 - 36.0 g/dL   RDW 19.1 47.8 - 29.5 %   Platelets 196 150 - 400 K/uL   nRBC 0.0 0.0 - 0.2 %   Neutrophils Relative % 73 %   Neutro Abs 6.6 1.7 - 7.7 K/uL   Lymphocytes Relative 15 %   Lymphs Abs 1.3 0.7 - 4.0 K/uL   Monocytes Relative 8 %   Monocytes Absolute 0.8 0.1 - 1.0 K/uL   Eosinophils Relative 3 %   Eosinophils Absolute 0.2 0.0 - 0.5 K/uL   Basophils Relative 0 %   Basophils Absolute 0.0 0.0 - 0.1 K/uL   Immature Granulocytes 1 %   Abs Immature Granulocytes 0.06 0.00 - 0.07 K/uL    Comment: Performed at [facility], [address] [place], [place] [address]  Basic  metabolic panel     Status: Abnormal   Collection Time: 03/29/21  6:00 PM  Result Value Ref Range   Sodium 129 (L) 135 - 145 mmol/L   Potassium 3.8 3.5 - 5.1 mmol/L   Chloride 97 (L) 98 - 111 mmol/L   CO2 20 (L) 22 - 32 mmol/L   Glucose, Bld 101 (H) 70 - 99 mg/dL    Comment: Glucose reference range applies only to samples taken after fasting for at least 8 hours.   BUN 15 8 - 23 mg/dL   Creatinine, Ser 8.65 0.44 - 1.00 mg/dL   Calcium 9.2 8.9 - 78.4 mg/dL   GFR, Estimated >69 >62 mL/min    Comment: (NOTE) Calculated using the CKD-EPI Creatinine Equation (2021)    Anion gap 12 5 - 15    Comment: Performed at [facility], [address] [place], [place] [address]  Resp Panel by RT-PCR (Flu A&B, Covid) Nasopharyngeal Swab     Status: None   Collection Time: 03/29/21  6:00 PM   Specimen: Nasopharyngeal Swab; Nasopharyngeal(NP) swabs in  vial transport medium  Result Value Ref Range   SARS Coronavirus 2 by RT PCR NEGATIVE NEGATIVE    Comment: (NOTE) SARS-CoV-2 target nucleic acids are NOT DETECTED.  The SARS-CoV-2 RNA is generally detectable in upper respiratory specimens during the acute phase of infection. The lowest concentration of SARS-CoV-2 viral copies this assay can detect is 138 copies/mL. A negative result does not preclude SARS-Cov-2 infection and should not be used as the sole basis for treatment or other patient management decisions. A negative result may occur with  improper specimen collection/handling, submission of specimen other than nasopharyngeal swab, presence of viral mutation(s) within the areas targeted by this assay, and inadequate number of viral copies(<138 copies/mL). A negative result must be combined with clinical observations, patient history, and epidemiological information. The expected result is Negative.  Fact Sheet for Patients:  BloggerCourse.com  Fact Sheet for Healthcare Providers:   SeriousBroker.it  This test is no t yet approved or cleared by the [place] FDA and  has been authorized for detection and/or diagnosis of SARS-CoV-2 by FDA under an Emergency Use Authorization (EUA). This EUA will remain  in effect (meaning this test can be used) for the duration of the COVID-19 declaration under Section 564(b)(1) of the Act, 21 U.S.C.section 360bbb-3(b)(1), unless the authorization is terminated  or revoked sooner.       Influenza A by PCR NEGATIVE NEGATIVE   Influenza B by PCR NEGATIVE NEGATIVE    Comment: (NOTE) The Xpert Xpress SARS-CoV-2/FLU/RSV plus assay is intended as an aid in the diagnosis of influenza from Nasopharyngeal swab specimens and should not be used as a sole basis for treatment. Nasal washings and aspirates are unacceptable for Xpert Xpress SARS-CoV-2/FLU/RSV testing.  Fact Sheet for Patients: BloggerCourse.com  Fact Sheet for Healthcare Providers: SeriousBroker.it  This test is not yet approved or cleared by the [place] FDA and has been authorized for detection and/or diagnosis of SARS-CoV-2 by FDA under an Emergency Use Authorization (EUA). This EUA will remain in effect (meaning this test can be used) for the duration of the COVID-19 declaration under Section 564(b)(1) of the Act, 21 U.S.C. section 360bbb-3(b)(1), unless the authorization is terminated or revoked.  Performed at [facility], [address] [place], [place] [address]    No results found.  Review of Systems  Constitutional: Negative.   HENT: Negative.   Eyes: Negative.   Respiratory: Negative.   Cardiovascular: Negative.   Gastrointestinal: Negative.   Endocrine: Negative.   Genitourinary: Negative.   Musculoskeletal: Negative.   Skin: Positive for wound.  Allergic/Immunologic: Negative.   Neurological: Negative.   Hematological: Negative.    Psychiatric/Behavioral: Negative.     Blood pressure (!) 148/87, pulse 65, temperature 97.8 F (36.6 C), temperature source Oral, resp. rate 18, height 5' (1.524 m), weight 95.3 kg, SpO2 97 %. Physical Exam Constitutional:      General: She is not in acute distress.    Appearance: Normal appearance. She is obese.  HENT:     Head: Normocephalic and atraumatic.     Right Ear: External ear normal.     Left Ear: External ear normal.     Nose: Nose normal.     Mouth/Throat:     Mouth: Mucous membranes are moist.     Pharynx: Oropharynx is clear.  Eyes:     General: No scleral icterus.    Extraocular Movements: Extraocular movements intact.     Conjunctiva/sclera: Conjunctivae normal.     Pupils: Pupils  are equal, round, and reactive to light.  Cardiovascular:     Rate and Rhythm: Normal rate and regular rhythm.     Pulses: Normal pulses.     Heart sounds: Normal heart sounds.     Comments: No pitting edema lower extr Pulmonary:     Effort: Pulmonary effort is normal. No respiratory distress.     Breath sounds: Normal breath sounds.  Abdominal:     General: Abdomen is flat.     Palpations: Abdomen is soft.     Tenderness: There is no abdominal tenderness.  Musculoskeletal:        General: No swelling or deformity. Normal range of motion.     Cervical back: Normal range of motion and neck supple. No tenderness.  Skin:    General: Skin is warm and dry.     Findings: Erythema present.     Comments: There is cellulitis and large abscess in left low axilla  Neurological:     General: No focal deficit present.     Mental Status: She is alert and oriented to person, place, and time.  Psychiatric:        Mood and Affect: Mood normal.        Behavior: Behavior normal.        Thought Content: Thought content normal.      Assessment/Plan The patient appears to have a large area of cellulitis and abscess in the low left axilla.  Because of the enlarging cellulitis I think she  should have this incised and drained in the operating room tonight.  I have discussed with her in detail the risks and benefits of the operation as well as some of the technical aspects and she understands and wishes to proceed.  We will also start her on IV vancomycin for likely MRSA infection  Chevis Pretty III, MD 03/29/2021, 9:28 PM

## 2021-03-29 NOTE — ED Notes (Signed)
PHONE HANDOFF REPORT PROVIDED TO [facility] TE[facility] °

## 2021-03-29 NOTE — ED Triage Notes (Signed)
C/o rash to left flank x 1 month , positive culture today , MRSA , sent here for  IV ABX

## 2021-03-29 NOTE — ED Notes (Signed)
Ultrasound device at bedside, pt sitting in chair in room

## 2021-03-29 NOTE — Transfer of Care (Signed)
Immediate Anesthesia Transfer of Care Note  Patient: Kelsey Jacobs  Procedure(s) Performed: INCISION AND DRAINAGE AXILLARY ABSCESS (Left )  Patient Location: PACU  Anesthesia Type:General  Level of Consciousness: awake, alert  and oriented  Airway & Oxygen Therapy: Patient Spontanous Breathing and Patient connected to face mask oxygen  Post-op Assessment: Report given to RN and Post -op Vital signs reviewed and stable  Post vital signs: Reviewed and stable  Last Vitals:  Vitals Value Taken Time  BP    Temp    Pulse    Resp    SpO2      Last Pain:  Vitals:   03/29/21 2109  TempSrc: Oral  PainSc:          Complications: No complications documented.

## 2021-03-29 NOTE — ED Notes (Signed)
Presents with area at left axilla to left breast, very red in color, tender to touch, area feels firm. Appears to have a small amt of puss draining from center of site. Was seen at [facility] for this issue, was sent here to have IV abx and I&D. States this began approx March 22nd

## 2021-03-29 NOTE — ED Notes (Signed)
COVID SWAB OBTAINED AND TO THE LAB 

## 2021-03-29 NOTE — ED Notes (Signed)
Safety measures in place, pt instructed not to get up without staff in room. Call bell within reach, on cont POX monitoring, pt again instructed to remain NPO

## 2021-03-29 NOTE — Op Note (Signed)
03/29/2021  11:42 PM  PATIENT:  Kelsey Jacobs  75 y.o. female  PRE-OPERATIVE DIAGNOSIS:  LEFT AXILLARY ABCESS  POST-OPERATIVE DIAGNOSIS:  LEFT AXILLARY ABCESS  PROCEDURE:  Procedure(s): INCISION AND DRAINAGE ABSCESS (Left)  SURGEON:  Surgeon(s) and Role:    * Griselda Miner, MD - Primary  PHYSICIAN ASSISTANT:   ASSISTANTS: none   ANESTHESIA:   local and general  EBL:  10 mL   BLOOD ADMINISTERED:none  DRAINS: none   LOCAL MEDICATIONS USED:  MARCAINE     SPECIMEN:  No Specimen  DISPOSITION OF SPECIMEN:  N/A  COUNTS:  YES  TOURNIQUET:  * No tourniquets in log *  DICTATION: .Dragon Dictation   After informed consent was obtained the patient was brought to the operating room and placed in the supine position on the operating table.  After adequate induction of general anesthesia the patient's left chest wall area was prepped with ChloraPrep, allowed to dry, and draped in usual sterile manner.  An appropriate timeout was performed.  The area over the palpable abscess was infiltrated with quarter percent Marcaine.  A transversely oriented incision was then made overlying the palpable abscess with a 15 blade knife.  The incision was carried through the skin and subcutaneous tissue sharply with the electrocautery until the abscess cavity was entered.  A moderate amount of pus was evacuated.  Cultures were obtained.  The wound was probed with a finger until all loculations were broken up and the abscess cavity was well drained.  Hemostasis was achieved using the Bovie electrocautery.  The wound was then packed with a Kerlix gauze and sterile dressings were applied.  The patient tolerated the procedure well.  At the end of the case all needle sponge and instrument counts were correct.  The patient was then awakened and taken recovery stable condition.  PLAN OF CARE: Admit to inpatient   PATIENT DISPOSITION:  PACU - hemodynamically stable.   Delay start of Pharmacological VTE agent  (>24hrs) due to surgical blood loss or risk of bleeding: no

## 2021-03-29 NOTE — Anesthesia Preprocedure Evaluation (Signed)
Anesthesia Evaluation  Patient identified by MRN, date of birth, ID band Patient awake    Reviewed: Allergy & Precautions, NPO status , Patient's Chart, lab work & pertinent test results  Airway Mallampati: II  TM Distance: >3 FB Neck ROM: Full    Dental  (+) Teeth Intact, Dental Advisory Given   Pulmonary Current SmokerPatient did not abstain from smoking.,    Pulmonary exam normal breath sounds clear to auscultation       Cardiovascular negative cardio ROS Normal cardiovascular exam Rhythm:Regular Rate:Normal     Neuro/Psych negative neurological ROS     GI/Hepatic negative GI ROS, Neg liver ROS,   Endo/Other  Morbid obesity  Renal/GU negative Renal ROS     Musculoskeletal LEFT AXILLARY ABCESS   Abdominal   Peds  Hematology negative hematology ROS (+)   Anesthesia Other Findings Day of surgery medications reviewed with the patient.  Reproductive/Obstetrics                             Anesthesia Physical Anesthesia Plan  ASA: III  Anesthesia Plan: General   Post-op Pain Management:    Induction: Intravenous  PONV Risk Score and Plan: 2 and Dexamethasone and Ondansetron  Airway Management Planned: LMA  Additional Equipment:   Intra-op Plan:   Post-operative Plan: Extubation in OR  Informed Consent: I have reviewed the patients History and Physical, chart, labs and discussed the procedure including the risks, benefits and alternatives for the proposed anesthesia with the patient or authorized representative who has indicated his/her understanding and acceptance.     Dental advisory given  Plan Discussed with: CRNA  Anesthesia Plan Comments:         Anesthesia Quick Evaluation

## 2021-03-29 NOTE — Anesthesia Procedure Notes (Signed)
Procedure Name: LMA Insertion Performed by: Kelci Petrella J, CRNA Pre-anesthesia Checklist: Patient identified, Emergency Drugs available, Suction available, Patient being monitored and Timeout performed Patient Re-evaluated:Patient Re-evaluated prior to induction Oxygen Delivery Method: Circle system utilized Preoxygenation: Pre-oxygenation with 100% oxygen Induction Type: IV induction Ventilation: Mask ventilation without difficulty LMA: LMA inserted LMA Size: 4.0 Number of attempts: 1 Placement Confirmation: positive ETCO2,  CO2 detector and breath sounds checked- equal and bilateral Tube secured with: Tape Dental Injury: Teeth and Oropharynx as per pre-operative assessment        

## 2021-03-29 NOTE — ED Provider Notes (Signed)
[facility] EMERGENCY DEPARTMENT Provider Note   CSN: [id number] Arrival date & time: 03/29/21  1552     History Chief Complaint  Patient presents with  . Rash     Kelsey Jacobs is a 75 y.o. female who presents after telephone call from her dermatology clinic.  She was treated for infection over the left chest wall a month ago.  She had recurrence of this infection, was seen this past Tuesday, had a culture that showed MRSA positive susceptible to Bactrim.  She has taken 2 days worth of Bactrim with rapidly spreading erythema over the left breast now pustules forming under the left armpit and developing abscess.  It is exquisitely tender and she rates the pain at 9 out of 10.  She has pain when anything touches it including her clothing.  She denies any fevers or chills.  HPI     History reviewed. No pertinent past medical history.  There are no problems to display for this patient.   Past Surgical History:  Procedure Laterality Date  . BACK SURGERY    . FOOT SURGERY       OB History   No obstetric history on file.     No family history on file.  Social History   Tobacco Use  . Smoking status: Current Every Day Smoker    Packs/day: 1.00    Home Medications Prior to Admission medications   Medication Sig Start Date End Date Taking? Authorizing Provider  diazepam (VALIUM) 10 MG tablet Take 1 tablet (10 mg total) by mouth every 12 (twelve) hours as needed for anxiety. 04/27/14   Lenn Sink, DPM  meloxicam (MOBIC) 15 MG tablet  04/14/14   [provider]    Allergies    Patient has no known allergies.  Review of Systems   Review of Systems Ten systems reviewed and are negative for acute change, except as noted in the HPI.   Physical Exam Updated Vital Signs BP (!) 188/88   Pulse 75   Temp 97.7 F (36.5 C) (Oral)   Resp 16   Ht 5' (1.524 m)   Wt 95.3 kg   SpO2 98%   BMI 41.01 kg/m   Physical Exam Vitals and nursing note  reviewed.  Constitutional:      General: She is not in acute distress.    Appearance: She is well-developed. She is not diaphoretic.  HENT:     Head: Normocephalic and atraumatic.  Eyes:     General: No scleral icterus.    Conjunctiva/sclera: Conjunctivae normal.  Cardiovascular:     Rate and Rhythm: Normal rate and regular rhythm.     Heart sounds: Normal heart sounds. No murmur heard. No friction rub. No gallop.   Pulmonary:     Effort: Pulmonary effort is normal. No respiratory distress.     Breath sounds: Normal breath sounds.  Abdominal:     General: Bowel sounds are normal. There is no distension.     Palpations: Abdomen is soft. There is no mass.     Tenderness: There is no abdominal tenderness. There is no guarding.  Musculoskeletal:     Cervical back: Normal range of motion.  Skin:    General: Skin is warm and dry.     Findings: Erythema present.     Comments: 40 cm area of erythema under the left axilla.  There is an area of approximately 15 cm centrally which feels indurated and is exquisitely tender to palpation.  Bedside ultrasound shows large area of loculated fluid.  Neurological:     Mental Status: She is alert and oriented to person, place, and time.  Psychiatric:        Behavior: Behavior normal.     ED Results / Procedures / Treatments   Labs (all labs ordered are listed, but only abnormal results are displayed) Labs Reviewed  RESP PANEL BY RT-PCR (FLU A&B, COVID) [id number]  CBC WITH DIFFERENTIAL/PLATELET  BASIC METABOLIC PANEL    EKG None  Radiology No results found.  Procedures Procedures   Medications Ordered in ED Medications - No data to display  ED Course  I have reviewed the triage vital signs and the nursing notes.  Pertinent labs & imaging results that were available during my care of the patient were reviewed by me and considered in my medical decision making (see chart for details).  Clinical Course as of 03/29/21 1955  Thu Mar 29, 8070  [id number] 75 year old female here for evaluation of cellulitis and probable abscess reportedly MRSA positive.  She is quite tender.  Due to the extensive size of it I recommended that we contact general surgery and they can assess for this would require going to the operating room. [MB]    Clinical Course User Index [MB] Terrilee Files, MD   MDM Rules/Calculators/A&P                          75 year old female here with abscess and cellulitis of the left chest wall.  She has mild hyponatremia, negative COVID test, CBC within normal limits.  She has extensive cellulitis and exquisite tenderness with angry red abscess on the left wall of the chest which appears to be loculated on my ultrasound.  Patient will be transferred to the [facility] emergency department.  I suspect she will need admission for her cellulitis and failed outpatient antibiotics I have discussed this with Dr. Carolynne Edouard who is asked that we do an ED to ED transfer and he will evaluate her. Final Clinical Impression(s) / ED Diagnoses Final diagnoses:  None    Rx / DC Orders ED Discharge Orders    None       Arthor Captain, PA-C 03/29/21 1956    Terrilee Files, MD 03/30/21 1020

## 2021-03-29 NOTE — ED Notes (Signed)
Pt BIB [facility] from [facility]. Per [facility], pt has abscess that extends from left mid-axillary to left breast. Pt here for surgical consult. Pain 3/10. Last PO intake 1200, last fluid intake at 1530. 20G right hand.

## 2021-03-29 NOTE — ED Notes (Signed)
PHONE HANDOFF REPORT GIVEN TO CHARGE NURSE AT [facility]

## 2021-03-29 NOTE — Anesthesia Postprocedure Evaluation (Signed)
Anesthesia Post Note  Patient: Kelsey Jacobs  Procedure(s) Performed: INCISION AND DRAINAGE AXILLARY ABSCESS (Left )     Patient location during evaluation: PACU Anesthesia Type: General Level of consciousness: awake and alert Pain management: pain level controlled Vital Signs Assessment: post-procedure vital signs reviewed and stable Respiratory status: spontaneous breathing, nonlabored ventilation, respiratory function stable and patient connected to nasal cannula oxygen Cardiovascular status: blood pressure returned to baseline and stable Postop Assessment: no apparent nausea or vomiting Anesthetic complications: no   No complications documented.  Last Vitals:  Vitals:   03/29/21 2000 03/29/21 2109  BP: (!) 117/99 (!) 148/87  Pulse: 65 65  Resp: 16 18  Temp:  36.6 C  SpO2: 92% 97%    Last Pain:  Vitals:   03/29/21 2109  TempSrc: Oral  PainSc:                  Cecile Hearing

## 2021-03-29 NOTE — Progress Notes (Addendum)
Pharmacy Antibiotic Note  Kelsey Jacobs is a 75 y.o. female admitted on 03/29/2021 with cellulitis with small amount of puss noted.  Pharmacy has been consulted for vancomycin dosing.  Plan: Vancomycin 2g IV x1, then 1g IV q24h (Est AUC 503) - F/u renal function for further vancomycin dosing  - Monitor clinical improvement, culture data, and LOT for de-escalation   Height: 5' (152.4 cm) Weight: 95.3 kg (210 lb) IBW/kg (Calculated) : 45.5  Temp (24hrs), Avg:97.7 F (36.5 C), Min:97.7 F (36.5 C), Max:97.7 F (36.5 C)  No results for input(s): WBC, CREATININE, LATICACIDVEN, VANCOTROUGH, VANCOPEAK, VANCORANDOM, GENTTROUGH, GENTPEAK, GENTRANDOM, TOBRATROUGH, TOBRAPEAK, TOBRARND, AMIKACINPEAK, AMIKACINTROU, AMIKACIN in the last 168 hours.  CrCl cannot be calculated (No successful lab value found.).    No Known Allergies  Antimicrobials this admission: Vancomycin 4/21 >>   Dose adjustments this admission:   Microbiology results:   Thank you for allowing pharmacy to be a part of this patient's care.  Trixie Rude, PharmD PGY1 Acute Care Pharmacy Resident  03/29/2021 6:03 PM  Please check [email or web address] for unit-specific pharmacy phone numbers.

## 2021-03-29 NOTE — ED Provider Notes (Signed)
Was transferred from [facility] ER to be evaluated and admitted by surgery.  This is a 75 year old female who developed infection to the left chest wall ongoing for the past month with a blood culture that shows MRSA positive susceptible for Bactrim.  Despite taking Bactrim, her symptoms progressed.  Due to the extensiveness of her infection, and failed outpatient antibiotic, surgery, Dr. Carolynne Edouard was consulted who recommended ED to ED transfer to [facility], ER.  On exam patient has moderate erythema involving her left chest extending towards the left breast with induration and fluctuance and associated left axillary lymphadenopathy.  Dr. Carolynne Edouard has seen evaluate patient in the ED and will take patient to the OR for operation.  At this time patient is resting comfortable and pain has improved with pain medication.    BP (!) 148/87 (BP Location: Right Arm)   Pulse 65   Temp 97.8 F (36.6 C) (Oral)   Resp 18   Ht 5' (1.524 m)   Wt 95.3 kg   SpO2 97%   BMI 41.01 kg/m   Results for orders placed or performed during the hospital encounter of 03/29/21  Resp Panel by RT-PCR (Flu A&B, Covid) Nasopharyngeal Swab   Specimen: Nasopharyngeal Swab; Nasopharyngeal(NP) swabs in vial transport medium  Result Value Ref Range   SARS Coronavirus 2 by RT PCR NEGATIVE NEGATIVE   Influenza A by PCR NEGATIVE NEGATIVE   Influenza B by PCR NEGATIVE NEGATIVE  CBC with Differential  Result Value Ref Range   WBC 9.0 4.0 - 10.5 K/uL   RBC 4.52 3.87 - 5.11 MIL/uL   Hemoglobin 13.9 12.0 - 15.0 g/dL   HCT 73.2 20.2 - 54.2 %   MCV 91.2 80.0 - 100.0 fL   MCH 30.8 26.0 - 34.0 pg   MCHC 33.7 30.0 - 36.0 g/dL   RDW 70.6 23.7 - 62.8 %   Platelets 196 150 - 400 K/uL   nRBC 0.0 0.0 - 0.2 %   Neutrophils Relative % 73 %   Neutro Abs 6.6 1.7 - 7.7 K/uL   Lymphocytes Relative 15 %   Lymphs Abs 1.3 0.7 - 4.0 K/uL   Monocytes Relative 8 %   Monocytes Absolute 0.8 0.1 - 1.0 K/uL   Eosinophils Relative 3 %    Eosinophils Absolute 0.2 0.0 - 0.5 K/uL   Basophils Relative 0 %   Basophils Absolute 0.0 0.0 - 0.1 K/uL   Immature Granulocytes 1 %   Abs Immature Granulocytes 0.06 0.00 - 0.07 K/uL  Basic metabolic panel  Result Value Ref Range   Sodium 129 (L) 135 - 145 mmol/L   Potassium 3.8 3.5 - 5.1 mmol/L   Chloride 97 (L) 98 - 111 mmol/L   CO2 20 (L) 22 - 32 mmol/L   Glucose, Bld 101 (H) 70 - 99 mg/dL   BUN 15 8 - 23 mg/dL   Creatinine, Ser 3.15 0.44 - 1.00 mg/dL   Calcium 9.2 8.9 - 17.6 mg/dL   GFR, Estimated >16 >07 mL/min   Anion gap 12 5 - 15   MR LUMBAR SPINE WO CONTRAST  Result Date: 03/17/2021 CLINICAL DATA:  Low back pain with left leg pain. History of back surgery. EXAM: MRI LUMBAR SPINE WITHOUT CONTRAST TECHNIQUE: Multiplanar, multisequence MR imaging of the lumbar spine was performed. No intravenous contrast was administered. COMPARISON:  Lumbar MRI 08/17/2012 FINDINGS: Segmentation:  Normal Alignment: Mild lumbar levoscoliosis at L3-4. Mild retrolisthesis L2-3 and L3-4. Vertebrae:  Negative for fracture or mass.  Conus medullaris and cauda equina: Conus extends to the T12-L1 level. Conus and cauda equina appear normal. Paraspinal and other soft tissues: Complex cyst in the subcutaneous tissues of the right back at the T11 level measuring 2 cm in diameter. Right benign skin cyst. No paraspinous mass or adenopathy. Disc levels: T12-L1: Mild facet degeneration.  Negative for stenosis L1-2: Mild disc bulging and moderate facet degeneration. Negative for stenosis. L2-3: Disc degeneration with diffuse disc bulging and endplate spurring. Moderate facet hypertrophy right greater than left. Moderate subarticular and foraminal stenosis on the right due to spurring. Mild spinal stenosis. Progressive stenosis since the prior study. L3-4: Asymmetric disc degeneration on the right with disc space narrowing and spurring. Moderate facet degeneration bilaterally. Moderate right subarticular stenosis and mild  left subarticular stenosis. Mild spinal stenosis. No interval change. L4-5: Bilateral rate cage fusion unchanged.  No significant stenosis L5-S1: Normal disc space. Moderate facet degeneration. Mild left subarticular and foraminal stenosis. IMPRESSION: Mild spinal stenosis L2-3. Moderate subarticular foraminal stenosis on the right. Progressive stenosis since the prior study. Asymmetric disc degeneration and spurring on the right at L3-4 with moderate right subarticular stenosis and mild left subarticular stenosis mild stenosis of the spinal canal. No interval change Bilateral rate cage fusion L4-5 without stenosis Mild left subarticular and foraminal stenosis L5-S1 due to spurring. Electronically Signed   By: Marlan Palau M.D.   On: 03/17/2021 14:55      Fayrene Helper, PA-C 03/29/21 2127    Arby Barrette, MD 04/10/21 [phone]

## 2021-03-30 ENCOUNTER — Encounter (HOSPITAL_COMMUNITY): Payer: Self-pay | Admitting: General Surgery

## 2021-03-30 DIAGNOSIS — L02419 Cutaneous abscess of limb, unspecified: Secondary | ICD-10-CM | POA: Diagnosis present

## 2021-03-30 MED ORDER — HYDROCODONE-ACETAMINOPHEN 5-325 MG PO TABS
1.0000 | ORAL_TABLET | ORAL | Status: DC | PRN
  Administered 2021-03-30 (×2): 2 via ORAL
  Filled 2021-03-30 (×2): qty 2

## 2021-03-30 MED ORDER — ONDANSETRON 4 MG PO TBDP: 4.0000 mg | ORAL_TABLET | Freq: Four times a day (QID) | ORAL | Status: DC | PRN

## 2021-03-30 MED ORDER — FENTANYL CITRATE (PF) 100 MCG/2ML IJ SOLN
INTRAMUSCULAR | Status: AC
  Filled 2021-03-30: qty 2

## 2021-03-30 MED ORDER — ONDANSETRON 4 MG PO TBDP: 4.0000 mg | ORAL_TABLET | Freq: Three times a day (TID) | ORAL | Status: DC | PRN

## 2021-03-30 MED ORDER — ONDANSETRON HCL 4 MG/2ML IJ SOLN: 4.0000 mg | Freq: Four times a day (QID) | INTRAMUSCULAR | Status: DC | PRN

## 2021-03-30 MED ORDER — KCL IN DEXTROSE-NACL 20-5-0.9 MEQ/L-%-% IV SOLN
INTRAVENOUS | Status: DC
  Filled 2021-03-30: qty 1000

## 2021-03-30 MED ORDER — MORPHINE SULFATE (PF) 2 MG/ML IV SOLN
1.0000 mg | INTRAVENOUS | Status: DC | PRN
  Administered 2021-03-30: 2 mg via INTRAVENOUS
  Filled 2021-03-30: qty 1

## 2021-03-30 MED ORDER — GABAPENTIN 300 MG PO CAPS
300.0000 mg | ORAL_CAPSULE | Freq: Every day | ORAL | Status: DC
  Filled 2021-03-30: qty 1

## 2021-03-30 MED ORDER — VANCOMYCIN HCL 1000 MG/200ML IV SOLN
1000.0000 mg | INTRAVENOUS | Status: DC
  Administered 2021-03-30: 1000 mg via INTRAVENOUS
  Filled 2021-03-30: qty 200

## 2021-03-30 MED ORDER — OXYCODONE HCL 5 MG PO TABS
5.0000 mg | ORAL_TABLET | ORAL | Status: DC | PRN
  Administered 2021-03-30 – 2021-03-31 (×3): 10 mg via ORAL
  Filled 2021-03-30 (×3): qty 2

## 2021-03-30 MED ORDER — PANTOPRAZOLE SODIUM 40 MG IV SOLR: 40.0000 mg | Freq: Every day | INTRAVENOUS | Status: DC

## 2021-03-30 MED ORDER — ACETAMINOPHEN 500 MG PO TABS
1000.0000 mg | ORAL_TABLET | Freq: Four times a day (QID) | ORAL | Status: DC
  Administered 2021-03-30 – 2021-03-31 (×3): 1000 mg via ORAL
  Filled 2021-03-30 (×3): qty 2

## 2021-03-30 MED ORDER — HEPARIN SODIUM (PORCINE) 5000 UNIT/ML IJ SOLN
5000.0000 [IU] | Freq: Three times a day (TID) | INTRAMUSCULAR | Status: DC
  Filled 2021-03-30: qty 1

## 2021-03-30 MED ORDER — PANTOPRAZOLE SODIUM 40 MG PO TBEC
40.0000 mg | DELAYED_RELEASE_TABLET | Freq: Every day | ORAL | Status: DC
  Administered 2021-03-30: 40 mg via ORAL
  Filled 2021-03-30: qty 1

## 2021-03-30 NOTE — Plan of Care (Signed)

## 2021-03-30 NOTE — Progress Notes (Signed)
    1 Day Post-Op  Subjective: Patient feels much better than pre-op but still with a fair amount of pain.  Hungry and wanting to eat  ROS: See above, otherwise other systems negative  Objective: Vital signs in last 24 hours: Temp:  [97.5 F (36.4 C)-98.6 F (37 C)] 97.5 F (36.4 C) (04/22 0536) Pulse Rate:  [59-83] 62 (04/22 0536) Resp:  [14-20] 14 (04/22 0536) BP: (116-188)/(47-137) 150/65 (04/22 0536) SpO2:  [90 %-100 %] 95 % (04/22 0536) Weight:  [95.3 kg-96.7 kg] 96.7 kg (04/22 0120)    Intake/Output from previous day: 04/21 0701 - 04/22 0700 In: 243.5 [P.O.:80; I.V.:163.5] Out: 510 [Urine:500; Blood:10] Intake/Output this shift: No intake/output data recorded.  PE: Skin: Left axilla still with significant erythema and cellulitis.  Wound is clean.  Packing removed, no further purulent drainage, packing replaced.  Multiple small pustules noted around larger wound.  Lab Results:  Recent Labs    03/29/21 1800  WBC 9.0  HGB 13.9  HCT 41.2  PLT 196   BMET Recent Labs    03/29/21 1800  NA 129*  K 3.8  CL 97*  CO2 20*  GLUCOSE 101*  BUN 15  CREATININE 0.75  CALCIUM 9.2   PT/INR No results for input(s): LABPROT, INR in the last 72 hours. CMP     Component Value Date/Time   NA 129 (L) 03/29/2021 1800   K 3.8 03/29/2021 1800   CL 97 (L) 03/29/2021 1800   CO2 20 (L) 03/29/2021 1800   GLUCOSE 101 (H) 03/29/2021 1800   BUN 15 03/29/2021 1800   CREATININE 0.75 03/29/2021 1800   CALCIUM 9.2 03/29/2021 1800   GFRNONAA >60 03/29/2021 1800   Lipase  No results found for: LIPASE     Studies/Results: No results found.  Anti-infectives: Anti-infectives (From admission, onward)   Start     Dose/Rate Route Frequency Ordered Stop   03/30/21 1800  vancomycin (VANCOCIN) IVPB 1000 mg/200 mL premix  Status:  Discontinued        1,000 mg 200 mL/hr over 60 Minutes Intravenous Every 24 hours 03/29/21 2122 03/30/21 0121   03/30/21 1800  vancomycin (VANCOREADY)  IVPB 1000 mg/200 mL        1,000 mg 200 mL/hr over 60 Minutes Intravenous Every 24 hours 03/30/21 0125     03/29/21 1830  vancomycin (VANCOREADY) IVPB 2000 mg/400 mL  Status:  Discontinued        2,000 mg 200 mL/hr over 120 Minutes Intravenous  Once 03/29/21 1803 03/29/21 2121   03/29/21 1805  vancomycin (VANCOCIN) 1000 MG powder       Note to Pharmacy: Juanda Crumble   : cabinet override      03/29/21 1805 03/29/21 1816       Assessment/Plan POD 1, s/p I&D of Left axillary abscess, Dr. Carolynne Edouard 03/30/21 -previous wound culture from earlier this week revealed MRSA.  Currently cx with gram + cocci -cont vanc IV today due to significant cellulitis and erythema.  If this has begun to decrease tomorrow, she can likely DC home with daily wound care and oral abx therapy -will attempt to get [facility] set up to assist with wound care at home.   FEN - regular diet VTE - heparin ID - vanc   LOS: 1 day    Letha Cape , [facility] 03/30/2021, 9:45 AM Please see Amion for pager number during day hours 7:00am-4:30pm or 7:00am -11:30am on weekends

## 2021-03-31 MED ORDER — SULFAMETHOXAZOLE-TRIMETHOPRIM 800-160 MG PO TABS: 1.0000 | ORAL_TABLET | Freq: Two times a day (BID) | ORAL | 0 refills | Status: DC

## 2021-03-31 MED ORDER — FLUCONAZOLE 150 MG PO TABS: 150.0000 mg | ORAL_TABLET | Freq: Once | ORAL | 1 refills | Status: AC

## 2021-03-31 MED ORDER — TRAMADOL HCL 50 MG PO TABS: 50.0000 mg | ORAL_TABLET | Freq: Four times a day (QID) | ORAL | 0 refills | Status: DC | PRN

## 2021-03-31 NOTE — Discharge Instructions (Signed)
   Skin Abscess  A skin abscess is an infected area of your skin that contains pus and other material. An abscess can happen in any part of your body. Some abscesses break open (rupture) on their own. Most continue to get worse unless they are treated. The infection can spread deeper into the body and into your blood, which can make you feel sick. A skin abscess is caused by germs that enter the skin through a cut or scrape. It can also be caused by blocked oil and sweat glands or infected hair follicles. This condition is usually treated by:  Draining the pus.  Taking antibiotic medicines.  Placing a warm, wet washcloth over the abscess. Follow these instructions at home: Medicines  Take over-the-counter and prescription medicines only as told by your doctor.  If you were prescribed an antibiotic medicine, take it as told by your doctor. Do not stop taking the antibiotic even if you start to feel better.   Abscess care  If you have an abscess that has not drained, place a warm, clean, wet washcloth over the abscess several times a day. Do this as told by your doctor.  Follow instructions from your doctor about how to take care of your abscess. Make sure you: ? Cover the abscess with a bandage (dressing). ? Change your bandage or gauze as told by your doctor. ? Wash your hands with soap and water before you change the bandage or gauze. If you cannot use soap and water, use hand sanitizer.  Check your abscess every day for signs that the infection is getting worse. Check for: ? More redness, swelling, or pain. ? More fluid or blood. ? Warmth. ? More pus or a bad smell.   General instructions  To avoid spreading the infection: ? Do not share personal care items, towels, or hot tubs with others. ? Avoid making skin-to-skin contact with other people.  Keep all follow-up visits as told by your doctor. This is important. Contact a doctor if:  You have more redness, swelling, or  pain around your abscess.  You have more fluid or blood coming from your abscess.  Your abscess feels warm when you touch it.  You have more pus or a bad smell coming from your abscess.  You have a fever.  Your muscles ache.  You have chills.  You feel sick. Get help right away if:  You have very bad (severe) pain.  You see red streaks on your skin spreading away from the abscess. Summary  A skin abscess is an infected area of your skin that contains pus and other material.  The abscess is caused by germs that enter the skin through a cut or scrape. It can also be caused by blocked oil and sweat glands or infected hair follicles.  Follow your doctor's instructions on caring for your abscess, taking medicines, preventing infections, and keeping follow-up visits. This information is not intended to replace advice given to you by your health care provider. Make sure you discuss any questions you have with your health care provider. Document Revised: 07/01/2019 Document Reviewed: 01/08/2018 Elsevier Patient Education  2021 Elsevier Inc. WOUND CARE: Cover wound with a dry gauze  Ok to shower today and as needed  Expect drainage  Tylenol and ibuprofen also for pain

## 2021-03-31 NOTE — Discharge Summary (Signed)
Physician Discharge Summary  Patient ID: Kelsey Jacobs MRN: [id number] DOB/AGE: 05/30/1946 75 y.o.  Admit date: 03/29/2021 Discharge date: 03/31/2021  Admission Diagnoses:  Discharge Diagnoses:  Active Problems:   Axillary abscess   Discharged Condition: good  Hospital Course: uneventful post op recovery s/p I and D left axillary abscess. Cellulitis improved  Consults: None  Significant Diagnostic Studies:   Treatments: surgery: I and D left axillary abscess  Discharge Exam: Blood pressure (!) 131/58, pulse (!) 51, temperature (!) 97.4 F (36.3 C), temperature source Oral, resp. rate 14, height 5' (1.524 m), weight 96.7 kg, SpO2 97 %. General appearance: alert, cooperative and no distress Incision/Wound:wound clean, no purulence  Disposition: Discharge disposition: 01-Home or Self Care        Allergies as of 03/31/2021   No Known Allergies     Medication List    TAKE these medications   diazepam 10 MG tablet Commonly known as: VALIUM Take 1 tablet (10 mg total) by mouth every 12 (twelve) hours as needed for anxiety.   fluconazole 150 MG tablet Commonly known as: DIFLUCAN Take 1 tablet (150 mg total) by mouth once for 1 dose.   gabapentin 300 MG capsule Commonly known as: NEURONTIN Take 1 capsule by mouth daily.   ondansetron 4 MG disintegrating tablet Commonly known as: ZOFRAN-ODT Take 4 mg by mouth every 8 (eight) hours as needed.   sulfamethoxazole-trimethoprim 800-160 MG tablet Commonly known as: BACTRIM DS Take 1 tablet by mouth 2 (two) times daily.   traMADol 50 MG tablet Commonly known as: Ultram Take 1 tablet (50 mg total) by mouth every 6 (six) hours as needed for moderate pain.       Follow-up Information    [facility], [facility] Follow up on 04/17/2021.   Specialty: General Surgery Why: arrive at 8:45am for a 9:15am appointment time for paperwork and check in process Contact information: [address] [place]  [address] [phone]               Signed: Abigail Miyamoto 03/31/2021, 7:44 AM

## 2021-03-31 NOTE — TOC Progression Note (Signed)
Transition of Care [facility]) - Progression Note    Patient Details  Name: Kelsey Jacobs MRN: [id number] Date of Birth: 01-08-1946  Transition [facility] Care [facility]) CM/SW Contact  Armanda Heritage, RN Phone Number: 03/31/2021, 12:08 PM  Clinical Narrative:    Frances Furbish to provide [facility] services.   Expected Discharge Plan: Home w Home Health Services Barriers to Discharge: No Barriers Identified  Expected Discharge Plan and Services Expected Discharge Plan: Home w Home Health Services   Discharge Planning Services: CM Consult Post Acute Care Choice: Home Health Living arrangements for the past 2 months: Single Family Home Expected Discharge Date: 03/31/21                         HH Arranged: RN HH Agency: [facility] Date [facility] Agency Contacted: 03/31/21 Time HH Agency Contacted: 1207 Representative spoke with at [facility] Agency: Kandee Keen   Social Determinants of Health (SDOH) Interventions    Readmission Risk Interventions No flowsheet data found.

## 2021-03-31 NOTE — Progress Notes (Signed)
Patient ID: Kelsey Jacobs, female   DOB: 07-Jan-1946, 75 y.o.   MRN: [id number]   Packing removed Wound looks good Cellulitis almost resolved  D/c home today

## 2021-04-04 LAB — AEROBIC/ANAEROBIC CULTURE W GRAM STAIN (SURGICAL/DEEP WOUND): Gram Stain: NONE SEEN

## 2021-09-03 ENCOUNTER — Other Ambulatory Visit (HOSPITAL_BASED_OUTPATIENT_CLINIC_OR_DEPARTMENT_OTHER): Payer: Self-pay

## 2021-09-03 ENCOUNTER — Other Ambulatory Visit: Payer: Self-pay

## 2021-09-03 ENCOUNTER — Emergency Department (HOSPITAL_BASED_OUTPATIENT_CLINIC_OR_DEPARTMENT_OTHER)
Admission: EM | Admit: 2021-09-03 | Discharge: 2021-09-03 | Disposition: A | Discharge status: Home / Self Care | Payer: Medicare Other | Attending: Emergency Medicine | Admitting: Emergency Medicine

## 2021-09-03 ENCOUNTER — Encounter (HOSPITAL_BASED_OUTPATIENT_CLINIC_OR_DEPARTMENT_OTHER): Payer: Self-pay | Admitting: Emergency Medicine

## 2021-09-03 DIAGNOSIS — R21 Rash and other nonspecific skin eruption: Secondary | ICD-10-CM | POA: Diagnosis present

## 2021-09-03 DIAGNOSIS — B029 Zoster without complications: Secondary | ICD-10-CM | POA: Diagnosis not present

## 2021-09-03 DIAGNOSIS — F17210 Nicotine dependence, cigarettes, uncomplicated: Secondary | ICD-10-CM | POA: Diagnosis not present

## 2021-09-03 DIAGNOSIS — R0981 Nasal congestion: Secondary | ICD-10-CM | POA: Insufficient documentation

## 2021-09-03 MED ORDER — VALACYCLOVIR HCL 1 G PO TABS
1000.0000 mg | ORAL_TABLET | Freq: Three times a day (TID) | ORAL | 0 refills | Status: DC
  Filled 2021-09-03: qty 21, 7d supply, fill #0

## 2021-09-03 MED ORDER — HYDRALAZINE HCL 25 MG PO TABS
25.0000 mg | ORAL_TABLET | Freq: Once | ORAL | Status: AC
  Administered 2021-09-03: 25 mg via ORAL
  Filled 2021-09-03: qty 1

## 2021-09-03 MED ORDER — TRAMADOL HCL 50 MG PO TABS
50.0000 mg | ORAL_TABLET | Freq: Four times a day (QID) | ORAL | 0 refills | Status: DC | PRN
  Filled 2021-09-03: qty 15, 4d supply, fill #0

## 2021-09-03 MED ORDER — AMLODIPINE BESYLATE 5 MG PO TABS
10.0000 mg | ORAL_TABLET | Freq: Once | ORAL | Status: AC
  Administered 2021-09-03: 10 mg via ORAL
  Filled 2021-09-03: qty 2

## 2021-09-03 MED ORDER — PREDNISONE 20 MG PO TABS
20.0000 mg | ORAL_TABLET | Freq: Two times a day (BID) | ORAL | 0 refills | Status: AC
  Filled 2021-09-03: qty 10, 5d supply, fill #0

## 2021-09-03 NOTE — Discharge Instructions (Addendum)
Call your primary care doctor or specialist as discussed in the next 2-3 days.   Return immediately back to the ER if:  Your symptoms worsen within the next 12-24 hours. You develop new symptoms such as new fevers, persistent vomiting, new pain, shortness of breath, or new weakness or numbness, or if you have any other concerns.  

## 2021-09-03 NOTE — ED Triage Notes (Signed)
Pt with rash to left abdomen. The rash is linear in pattern and tender to the touch.

## 2021-09-03 NOTE — ED Provider Notes (Signed)
[facility] EMERGENCY DEPT Provider Note   CSN: [id number] Arrival date & time: 09/03/21  1206     History Chief Complaint  Patient presents with   Rash    Kelsey Jacobs is a 75 y.o. female.  Patient presents with a left lower abdominal rash on the left side.  She is noticed rash 2 days now.  Describes it as "stinging."  Otherwise denies fevers or cough.  She has had nasal congestion for the past 3 to 4 days as well.  Denies vomiting or diarrhea denies abdominal pain.      Past Medical History:  Diagnosis Date   Medical history non-contributory     Patient Active Problem List   Diagnosis Date Noted   Axillary abscess 03/30/2021    Past Surgical History:  Procedure Laterality Date   BACK SURGERY     FOOT SURGERY     INCISION AND DRAINAGE ABSCESS Left 03/29/2021   Procedure: INCISION AND DRAINAGE AXILLARY ABSCESS;  Surgeon: Griselda Miner, MD;  Location: [facility];  Service: General;  Laterality: Left;     OB History   No obstetric history on file.     History reviewed. No pertinent family history.  Social History   Tobacco Use   Smoking status: Every Day    Packs/day: 0.50    Types: Cigarettes   Smokeless tobacco: Never  Vaping Use   Vaping Use: Never used  Substance Use Topics   Alcohol use: Yes    Comment: socially   Drug use: Not Currently    Home Medications Prior to Admission medications   Medication Sig Start Date End Date Taking? Authorizing Provider  predniSONE (DELTASONE) 20 MG tablet Take 1 tablet (20 mg total) by mouth 2 (two) times daily with a meal for 5 days. 09/03/21 09/08/21 Yes Cheryll Cockayne, MD  traMADol (ULTRAM) 50 MG tablet Take 1 tablet (50 mg total) by mouth every 6 (six) hours as needed. 09/03/21  Yes Cheryll Cockayne, MD  valACYclovir (VALTREX) 1000 MG tablet Take 1 tablet (1,000 mg total) by mouth 3 (three) times daily. 09/03/21  Yes Cheryll Cockayne, MD  diazepam (VALIUM) 10 MG tablet Take 1 tablet (10 mg total) by mouth  every 12 (twelve) hours as needed for anxiety. Patient not taking: Reported on 03/29/2021 04/27/14   Lenn Sink, DPM  gabapentin (NEURONTIN) 300 MG capsule Take 1 capsule by mouth daily. 03/24/21   [provider]  ondansetron (ZOFRAN-ODT) 4 MG disintegrating tablet Take 4 mg by mouth every 8 (eight) hours as needed. 03/27/21   [provider]  sulfamethoxazole-trimethoprim (BACTRIM DS) 800-160 MG tablet Take 1 tablet by mouth 2 (two) times daily. 03/31/21   Abigail Miyamoto, MD    Allergies    Patient has no known allergies.  Review of Systems   Review of Systems  Constitutional:  Negative for fever.  HENT:  Negative for ear pain.   Eyes:  Negative for pain.  Respiratory:  Negative for cough.   Cardiovascular:  Negative for chest pain.  Gastrointestinal:  Negative for abdominal pain.  Genitourinary:  Negative for flank pain.  Musculoskeletal:  Negative for back pain.  Skin:  Positive for rash.  Neurological:  Negative for headaches.   Physical Exam Updated Vital Signs BP (!) 205/100 (BP Location: Right Arm)   Pulse 74   Temp 98 F (36.7 C) (Oral)   Resp 20   Ht 5\' 2"  (1.575 m)   Wt 93.9 kg  SpO2 98%   BMI 37.86 kg/m   Physical Exam Constitutional:      General: She is not in acute distress.    Appearance: Normal appearance.  HENT:     Head: Normocephalic.     Nose: Nose normal.  Eyes:     Extraocular Movements: Extraocular movements intact.  Cardiovascular:     Rate and Rhythm: Normal rate.  Pulmonary:     Effort: Pulmonary effort is normal.  Musculoskeletal:        General: Normal range of motion.     Cervical back: Normal range of motion.  Skin:    Comments: Vesicular rash in a dermatomal pattern in the left lower abdominal region consistent with shingles.  No surrounding secondary infection or cellulitis noted.  Neurological:     General: No focal deficit present.     Mental Status: She is alert. Mental status is at baseline.    ED  Results / Procedures / Treatments   Labs (all labs ordered are listed, but only abnormal results are displayed) Labs Reviewed - No data to display  EKG None  Radiology No results found.  Procedures Procedures   Medications Ordered in ED Medications  hydrALAZINE (APRESOLINE) tablet 25 mg (has no administration in time range)  amLODipine (NORVASC) tablet 10 mg (10 mg Oral Given 09/03/21 1421)    ED Course  I have reviewed the triage vital signs and the nursing notes.  Pertinent labs & imaging results that were available during my care of the patient were reviewed by me and considered in my medical decision making (see chart for details).    MDM Rules/Calculators/A&P                           Patient presents with clinical shingles.  We will give treatment and advised outpatient follow-up with her doctor within the week.  Blood pressure was noted to be high given treatment here.  Recommending immediate return if she has fevers worsening pain or any additional concerns.  Final Clinical Impression(s) / ED Diagnoses Final diagnoses:  Herpes zoster without complication    Rx / DC Orders ED Discharge Orders          Ordered    valACYclovir (VALTREX) 1000 MG tablet  3 times daily        09/03/21 1509    traMADol (ULTRAM) 50 MG tablet  Every 6 hours PRN        09/03/21 1509    predniSONE (DELTASONE) 20 MG tablet  2 times daily with meals        09/03/21 1509             Cheryll Cockayne, MD 09/03/21 [phone]

## 2022-01-10 ENCOUNTER — Ambulatory Visit
Admission: RE | Admit: 2022-01-10 | Discharge: 2022-01-10 | Disposition: A | Discharge status: Home / Self Care | Payer: Medicare Other | Source: Ambulatory Visit | Attending: Chiropractic Medicine | Admitting: Chiropractic Medicine

## 2022-01-10 ENCOUNTER — Other Ambulatory Visit: Payer: Self-pay | Admitting: Chiropractic Medicine

## 2022-01-10 DIAGNOSIS — M25552 Pain in left hip: Secondary | ICD-10-CM

## 2022-07-26 DIAGNOSIS — E7800 Pure hypercholesterolemia, unspecified: Secondary | ICD-10-CM | POA: Insufficient documentation

## 2022-07-26 DIAGNOSIS — I10 Essential (primary) hypertension: Secondary | ICD-10-CM | POA: Insufficient documentation

## 2022-09-22 ENCOUNTER — Other Ambulatory Visit: Payer: Self-pay

## 2022-09-22 ENCOUNTER — Emergency Department (HOSPITAL_COMMUNITY): Payer: Medicare Other

## 2022-09-22 ENCOUNTER — Emergency Department (HOSPITAL_COMMUNITY)
Admission: EM | Admit: 2022-09-22 | Discharge: 2022-09-23 | Disposition: A | Discharge status: Home / Self Care | Payer: Medicare Other | Attending: Emergency Medicine | Admitting: Emergency Medicine

## 2022-09-22 ENCOUNTER — Encounter (HOSPITAL_COMMUNITY): Payer: Self-pay

## 2022-09-22 DIAGNOSIS — I10 Essential (primary) hypertension: Secondary | ICD-10-CM | POA: Insufficient documentation

## 2022-09-22 DIAGNOSIS — R531 Weakness: Secondary | ICD-10-CM | POA: Diagnosis present

## 2022-09-22 DIAGNOSIS — Z79899 Other long term (current) drug therapy: Secondary | ICD-10-CM | POA: Diagnosis not present

## 2022-09-22 DIAGNOSIS — F05 Delirium due to known physiological condition: Secondary | ICD-10-CM | POA: Insufficient documentation

## 2022-09-22 DIAGNOSIS — R4182 Altered mental status, unspecified: Secondary | ICD-10-CM | POA: Insufficient documentation

## 2022-09-22 DIAGNOSIS — Z20822 Contact with and (suspected) exposure to covid-19: Secondary | ICD-10-CM | POA: Diagnosis not present

## 2022-09-22 DIAGNOSIS — Z96642 Presence of left artificial hip joint: Secondary | ICD-10-CM | POA: Diagnosis not present

## 2022-09-22 DIAGNOSIS — R410 Disorientation, unspecified: Secondary | ICD-10-CM

## 2022-09-22 LAB — COMPREHENSIVE METABOLIC PANEL
ALT: 38 U/L (ref 0–44)
AST: 45 U/L — ABNORMAL HIGH (ref 15–41)
Albumin: 2.9 g/dL — ABNORMAL LOW (ref 3.5–5.0)
Alkaline Phosphatase: 66 U/L (ref 38–126)
Anion gap: 7 (ref 5–15)
BUN: 16 mg/dL (ref 8–23)
CO2: 23 mmol/L (ref 22–32)
Calcium: 7.7 mg/dL — ABNORMAL LOW (ref 8.9–10.3)
Chloride: 102 mmol/L (ref 98–111)
Creatinine, Ser: 0.68 mg/dL (ref 0.44–1.00)
GFR, Estimated: >60 mL/min (ref >60)
Glucose, Bld: 127 mg/dL — ABNORMAL HIGH (ref 70–99)
Potassium: 3.6 mmol/L (ref 3.5–5.1)
Sodium: 132 mmol/L — ABNORMAL LOW (ref 135–145)
Total Bilirubin: 0.8 mg/dL (ref 0.3–1.2)
Total Protein: 5.9 g/dL — ABNORMAL LOW (ref 6.5–8.1)

## 2022-09-22 LAB — CBC WITH DIFFERENTIAL/PLATELET
Abs Immature Granulocytes: 0.07 10*3/uL (ref 0.00–0.07)
Basophils Absolute: 0 10*3/uL (ref 0.0–0.1)
Basophils Relative: 1 %
Eosinophils Absolute: 0.1 10*3/uL (ref 0.0–0.5)
Eosinophils Relative: 1 %
HCT: 30.1 % — ABNORMAL LOW (ref 36.0–46.0)
Hemoglobin: 9.9 g/dL — ABNORMAL LOW (ref 12.0–15.0)
Immature Granulocytes: 1 %
Lymphocytes Relative: 11 %
Lymphs Abs: 0.7 10*3/uL (ref 0.7–4.0)
MCH: 30.9 pg (ref 26.0–34.0)
MCHC: 32.9 g/dL (ref 30.0–36.0)
MCV: 94.1 fL (ref 80.0–100.0)
Monocytes Absolute: 0.6 10*3/uL (ref 0.1–1.0)
Monocytes Relative: 9 %
Neutro Abs: 4.9 10*3/uL (ref 1.7–7.7)
Neutrophils Relative %: 77 %
Platelets: 176 10*3/uL (ref 150–400)
RBC: 3.2 MIL/uL — ABNORMAL LOW (ref 3.87–5.11)
RDW: 13 % (ref 11.5–15.5)
WBC: 6.3 10*3/uL (ref 4.0–10.5)
nRBC: 0 % (ref 0.0–0.2)

## 2022-09-22 LAB — AMMONIA: Ammonia: <10 umol/L (ref 9–35)

## 2022-09-22 LAB — URINALYSIS, ROUTINE W REFLEX MICROSCOPIC
Bacteria, UA: NONE SEEN
Bilirubin Urine: NEGATIVE
Glucose, UA: NEGATIVE mg/dL
Ketones, ur: NEGATIVE mg/dL
Leukocytes,Ua: NEGATIVE
Nitrite: NEGATIVE
Protein, ur: NEGATIVE mg/dL
Specific Gravity, Urine: 1.006 (ref 1.005–1.030)
pH: 6 (ref 5.0–8.0)

## 2022-09-22 LAB — SALICYLATE LEVEL: Salicylate Lvl: <7 mg/dL — ABNORMAL LOW (ref 7.0–30.0)

## 2022-09-22 LAB — ACETAMINOPHEN LEVEL: Acetaminophen (Tylenol), Serum: <10 ug/mL — ABNORMAL LOW (ref 10–30)

## 2022-09-22 LAB — MAGNESIUM: Magnesium: 2.4 mg/dL (ref 1.7–2.4)

## 2022-09-22 MED ORDER — LACTATED RINGERS IV BOLUS: 1000.0000 mL | Freq: Once | INTRAVENOUS | Status: DC

## 2022-09-22 MED ORDER — LACTATED RINGERS IV SOLN: INTRAVENOUS | Status: DC

## 2022-09-22 MED ORDER — ACETAMINOPHEN 325 MG PO TABS
650.0000 mg | ORAL_TABLET | Freq: Once | ORAL | Status: AC
  Administered 2022-09-23: 650 mg via ORAL
  Filled 2022-09-22: qty 2

## 2022-09-22 MED ORDER — LACTATED RINGERS IV BOLUS
1000.0000 mL | Freq: Once | INTRAVENOUS | Status: AC
  Administered 2022-09-22: 1000 mL via INTRAVENOUS

## 2022-09-22 NOTE — ED Triage Notes (Signed)
Pt bib [facility] from home. Pt had hip surgery on 09/20/23. Pt c/o increasing coonfusion for 2days. Pt PCP took pt off pain meds and beta blockers and recommended pt to ED. Vitals stable upon arrival. EMS Vitals: BP 134/64 HR 70 RR 24 161 cbg CO2 27-30 aox4

## 2022-09-22 NOTE — ED Provider Notes (Signed)
Care of the patient assumed at the change of shift. Had elective hip replacement on 10/12 in [facility], discharged 10/13 with pain meds and muscle relaxers. Was more confused and weak today, unable to stand or walk. Awaiting labs and CTA to eval infectious cause though likely is polypharmacy.   Clinical Course as of 09/23/22 0709  Mon Sep 23, 2022  0012 Rectal temp is elevated, WBC is normal, no signs of UTI. Initial CXR is clear, awaiting CTA to eval PE or occult PNA.  [CS]  5284 Covid/Flu are neg [CS]  1324 CTA is neg for signs of infection. I had a long discussion with patient, husband and son at bedside. She seems to be more alert now, but still confused some (trying to take off BP cuff, answering questions incorrectly). There is some erythema and warmth surrounding her surgical incision but no drainage or wound dehiscence, early cellulitis may explain the fever she had. She is also complaining of significant pain in her L hip. I suspect that her AMS is due to polypharmacy, but no other indication for medical admission at this time. Ultimately, the patient and family would like to go home where she is scheduled to have home physical therapy but she is not back to baseline or able to stand/walk safely at this point. Will continue to hold in the ED overnight, will reassess in AM and if she continues to improve, may be able to go home then. Otherwise she may require PT and TOC eval for short term rehab placement.  [CS]  0700 Care of the patient signed out to Dr. Matilde Sprang at the change of shift.  [CS]  0706 If she is back to normal today, ok to go home, if not TOC vs med admit, put on keflex if going home [MK]    Clinical Course User Index [CS] Truddie Hidden, MD [MK] Kommor, Blanco, MD      Truddie Hidden, MD 09/23/22 [phone]

## 2022-09-22 NOTE — ED Provider Notes (Signed)
[facility]-EMERGENCY DEPT Provider Note   CSN: [id number] Arrival date & time: 09/22/22  1903     History  Chief Complaint  Patient presents with   Altered Mental Status    Kelsey Jacobs is a 76 y.o. female.  HPI Patient presents for confusion and generalized weakness.  Medical history includes HTN, [name], chronic pain.  3 days ago, she underwent a total hip replacement on the left side.  New medications that were prescribed were ASA, gabapentin Flexeril, and oxycodone.  Her husband stopped giving her the oxycodone yesterday.  Patient is also prescribed Xanax but states that she has not taken this medication since before her surgery.  Following her surgery on Thursday, she was discharged on Friday.  She was advised to weight-bear as tolerated.  Husband reports that Friday was a good day for her.  She was able to ambulate in the home with assistance.  Starting on Saturday, she had mild confusion.  This seemed to worsen yesterday.  Husband describes behaviors as holding her spoon upside down, speaking nonsensically.  He does feel that it has waxed and waned.  Symptoms persisted today.  Additionally, a walking from the bathroom with husband's assistance, she experienced weakness in her legs and had to sit on the floor.  She was eased to the ground and did not sustain a fall.  Patient's husband called her orthopedic doctor this evening who advised to discontinue pain medications given her altered mental state.  Since she has been in the ED, patient's husband states that her mentation seems to have improved.  Patient does recall the episodes that her husband describes.  She does recall feeling confused and having a difficult time saying what she wanted to say.   She currently denies any areas of numbness or weakness.  Patient's husband reports that pursed lip breathing has been present for the last 6 months.    Home Medications Prior to Admission medications   Medication Sig  Start Date End Date Taking? Authorizing Provider  diazepam (VALIUM) 10 MG tablet Take 1 tablet (10 mg total) by mouth every 12 (twelve) hours as needed for anxiety. Patient not taking: Reported on 03/29/2021 04/27/14   Lenn Sink, DPM  gabapentin (NEURONTIN) 300 MG capsule Take 1 capsule by mouth daily. 03/24/21   [provider]  ondansetron (ZOFRAN-ODT) 4 MG disintegrating tablet Take 4 mg by mouth every 8 (eight) hours as needed. 03/27/21   [provider]  sulfamethoxazole-trimethoprim (BACTRIM DS) 800-160 MG tablet Take 1 tablet by mouth 2 (two) times daily. 03/31/21   Abigail Miyamoto, MD  traMADol (ULTRAM) 50 MG tablet Take 1 tablet (50 mg total) by mouth every 6 (six) hours as needed. 09/03/21   Cheryll Cockayne, MD  valACYclovir (VALTREX) 1000 MG tablet Take 1 tablet (1,000 mg total) by mouth 3 (three) times daily. 09/03/21   Cheryll Cockayne, MD      Allergies    Patient has no known allergies.    Review of Systems   Review of Systems  Constitutional:  Positive for fatigue.  Neurological:  Positive for weakness (Generalized).  Psychiatric/Behavioral:  Positive for confusion and decreased concentration.   All other systems reviewed and are negative.   Physical Exam Updated Vital Signs BP (!) 139/59   Pulse 81   Temp (!) 100.7 F (38.2 C) (Rectal)   Resp 16   Ht 5\' 2"  (1.575 m)   Wt 94.3 kg   SpO2 95%   BMI  38.04 kg/m  Physical Exam Vitals and nursing note reviewed.  Constitutional:      General: She is not in acute distress.    Appearance: Normal appearance. She is well-developed. She is not toxic-appearing or diaphoretic.  HENT:     Head: Normocephalic and atraumatic.     Right Ear: External ear normal.     Left Ear: External ear normal.     Nose: Nose normal.     Mouth/Throat:     Mouth: Mucous membranes are dry.  Eyes:     General: No scleral icterus.    Extraocular Movements: Extraocular movements intact.     Conjunctiva/sclera: Conjunctivae  normal.  Cardiovascular:     Rate and Rhythm: Normal rate and regular rhythm.     Heart sounds: No murmur heard. Pulmonary:     Effort: Pulmonary effort is normal. No respiratory distress.     Breath sounds: Normal breath sounds. No wheezing or rales.  Chest:     Chest wall: No tenderness.  Abdominal:     General: There is no distension.     Palpations: Abdomen is soft.     Tenderness: There is no abdominal tenderness.  Musculoskeletal:        General: No swelling. Normal range of motion.     Cervical back: Normal range of motion and neck supple.     Right lower leg: No edema.     Left lower leg: No edema.     Comments: Aquacel dressing in place on anterior left hip  Skin:    General: Skin is warm and dry.     Coloration: Skin is not jaundiced or pale.  Neurological:     General: No focal deficit present.     Mental Status: She is alert and oriented to person, place, and time.     Cranial Nerves: No cranial nerve deficit.     Sensory: No sensory deficit.     Motor: No weakness.     Coordination: Coordination normal.  Psychiatric:        Mood and Affect: Mood normal.        Behavior: Behavior normal.        Thought Content: Thought content normal.        Judgment: Judgment normal.     ED Results / Procedures / Treatments   Labs (all labs ordered are listed, but only abnormal results are displayed) Labs Reviewed  COMPREHENSIVE METABOLIC PANEL - Abnormal; Notable for the following components:      Result Value   Sodium 132 (*)    Glucose, Bld 127 (*)    Calcium 7.7 (*)    Total Protein 5.9 (*)    Albumin 2.9 (*)    AST 45 (*)    All other components within normal limits  CBC WITH DIFFERENTIAL/PLATELET - Abnormal; Notable for the following components:   RBC 3.20 (*)    Hemoglobin 9.9 (*)    HCT 30.1 (*)    All other components within normal limits  URINALYSIS, ROUTINE W REFLEX MICROSCOPIC - Abnormal; Notable for the following components:   Hgb urine dipstick SMALL  (*)    All other components within normal limits  ACETAMINOPHEN LEVEL - Abnormal; Notable for the following components:   Acetaminophen (Tylenol), Serum <10 (*)    All other components within normal limits  SALICYLATE LEVEL - Abnormal; Notable for the following components:   Salicylate Lvl <5.0 (*)    All other components within normal limits  URINE CULTURE  RESP PANEL BY RT-PCR (FLU A&B, COVID) ARPGX2  AMMONIA  MAGNESIUM  BLOOD GAS, VENOUS  CBG MONITORING, ED    EKG EKG Interpretation  Date/Time:  Sunday September 22 2022 20:08:30 EDT Ventricular Rate:  72 PR Interval:  148 QRS Duration: 106 QT Interval:  391 QTC Calculation: 428 R Axis:   -22 Text Interpretation: Sinus rhythm Borderline left axis deviation Borderline low voltage, extremity leads Abnormal R-wave progression, early transition Confirmed by Gloris Manchester [phone]) on 09/22/2022 8:17:02 PM  Radiology DG Hip Unilat W or Wo Pelvis 2-3 Views Left  Result Date: 09/22/2022 CLINICAL DATA:  Left hip surgery 3 days ago with pain. EXAM: DG HIP (WITH OR WITHOUT PELVIS) 2-3V LEFT COMPARISON:  01/10/2022. FINDINGS: There is no evidence of hip fracture or dislocation. Total hip arthroplasty changes are present on the left. There is no evidence of hardware loosening. Lumbar spinal fusion hardware and degenerative changes are noted. Mild degenerative changes are noted at the right hip and sacroiliac joints. IMPRESSION: Status post total hip arthroplasty without evidence of acute fracture or dislocation. Electronically Signed   By: Thornell Sartorius M.D.   On: 09/22/2022 23:42   DG Chest Port 1 View  Result Date: 09/22/2022 CLINICAL DATA:  Confusion EXAM: PORTABLE CHEST 1 VIEW COMPARISON:  09/12/2022 FINDINGS: Heart and mediastinal contours are within normal limits. No focal opacities or effusions. No acute bony abnormality. Aortic atherosclerosis. IMPRESSION: No active disease. Electronically Signed   By: Charlett Nose M.D.   On: 09/22/2022  20:02   CT HEAD WO CONTRAST  Result Date: 09/22/2022 CLINICAL DATA:  Altered mental status/confusion, recent hip surgery EXAM: CT HEAD WITHOUT CONTRAST TECHNIQUE: Contiguous axial images were obtained from the base of the skull through the vertex without intravenous contrast. RADIATION DOSE REDUCTION: This exam was performed according to the departmental dose-optimization program which includes automated exposure control, adjustment of the mA and/or kV according to patient size and/or use of iterative reconstruction technique. COMPARISON:  None Available. FINDINGS: Brain: No evidence of acute infarction, hemorrhage, hydrocephalus, extra-axial collection or mass lesion/mass effect. Vascular: No hyperdense vessel or unexpected calcification. Skull: Normal. Negative for fracture or focal lesion. Sinuses/Orbits: The visualized paranasal sinuses are essentially clear. The mastoid air cells are unopacified. Other: None. IMPRESSION: Normal head CT. Electronically Signed   By: Charline Bills M.D.   On: 09/22/2022 19:50    Procedures Procedures    Medications Ordered in ED Medications  lactated ringers infusion ( Intravenous New Bag/Given 09/22/22 2359)  acetaminophen (TYLENOL) tablet 650 mg (has no administration in time range)  lactated ringers bolus 1,000 mL (0 mLs Intravenous Stopped 09/22/22 2208)    ED Course/ Medical Decision Making/ A&P                           Medical Decision Making Amount and/or Complexity of Data Reviewed Labs: ordered. Radiology: ordered.  Risk Prescription drug management.   This patient presents to the ED for concern of generalized weakness and confusion, this involves an extensive number of treatment options, and is a complaint that carries with it a high risk of complications and morbidity.  The differential diagnosis includes polypharmacy, infection, anemia, CVA, metabolic derangements   Co morbidities that complicate the patient evaluation  HTN, [name],  chronic pain, and recent hip surgery   Additional history obtained:  Additional history obtained from EMS, patient's husband External records from outside source obtained and reviewed including EMR   Lab Tests:  I Ordered, and personally interpreted labs.  The pertinent results include: Patient has a 30% drop in her hemoglobin since before her surgery.  I suspect this is operative blood loss.  Current kidney function is normal.  Hypocalcemia is present but electrolytes are otherwise normal.  There is no evidence of UTI.  There is no leukocytosis.   Imaging Studies ordered:  I ordered imaging studies including chest x-ray, CT head, left hip x-ray, CTA chest I independently visualized and interpreted imaging which showed no acute findings on chest x-ray or CT head.  Remaining studies pending at time of signout. I agree with the radiologist interpretation   Cardiac Monitoring: / EKG:  The patient was maintained on a cardiac monitor.  I personally viewed and interpreted the cardiac monitored which showed an underlying rhythm of: Sinus rhythm  Problem List / ED Course / Critical interventions / Medication management  Patient is a 76 year old female presenting from home for 2 days of worsening confusion and generalized weakness.  Recent history is notable for a total hip replacement 4 days ago.  She returned home 3 days ago.  She was started on multiple new medications.  This includes ASA, gabapentin, Flexeril, and oxycodone.  She has been taking these at home up until this evening.  Given the progression of symptoms, there is suspicion for polypharmacy.  Patient also has been prescribed Xanax although she states that she has not taken this since prior to her surgery.  Given the timeline, I have low suspicion for Xanax contributing to her polypharmacy.  I also have low suspicion for benzodiazepine withdrawal.  On exam, patient is alert and oriented.  She has dry oral mucosa.  Her husband  confirms that she has been eating and drinking very little since her surgery.  IV fluids were ordered.  Diagnostic work-up was initiated.  Patient's initial lab work is notable for hemoglobin of 9.9.  Per chart review, preoperative hemoglobin was in the range of 15.  I suspect that this acute anemia is contributing to her generalized weakness.  It does not explain her delirium.  Following IV fluids, patient was unable to provide a urine sample.  Bedside ultrasound showed bladder volume of 500 cc.  Patient has acute urinary retention.  Foley catheter was placed and 750 cc of dark urine was drained.  This urine showed no evidence of infection.  I had a shared decision-making discussion with family.  They are hesitant to bring her home due to the severe generalized weakness she had today.  When I attempted to stand the patient at bedside, she was too weak to even sit up.  This prompted further work-up.  She does feel warm to the touch and rectal temperature was ordered.  Patient also undergo COVID testing and CTA chest.  Care of patient was signed out to oncoming ED provider. I ordered medication including IV fluids for hydration; Tylenol for analgesia. Reevaluation of the patient after these medicines showed that the patient improved I have reviewed the patients home medicines and have made adjustments as needed   Social Determinants of Health:  Lives at home with husband         Final Clinical Impression(s) / ED Diagnoses Final diagnoses:  Delirium  Generalized weakness    Rx / DC Orders ED Discharge Orders     None         Gloris Manchester, MD 09/23/22 0000

## 2022-09-23 ENCOUNTER — Encounter (HOSPITAL_COMMUNITY): Payer: Self-pay

## 2022-09-23 ENCOUNTER — Emergency Department (HOSPITAL_COMMUNITY): Payer: Medicare Other

## 2022-09-23 DIAGNOSIS — R531 Weakness: Secondary | ICD-10-CM | POA: Diagnosis not present

## 2022-09-23 LAB — RESP PANEL BY RT-PCR (FLU A&B, COVID) ARPGX2
Influenza A by PCR: NEGATIVE
Influenza B by PCR: NEGATIVE
SARS Coronavirus 2 by RT PCR: NEGATIVE

## 2022-09-23 LAB — CBG MONITORING, ED: Glucose-Capillary: 126 mg/dL — ABNORMAL HIGH (ref 70–99)

## 2022-09-23 MED ORDER — OXYCODONE HCL 5 MG PO TABS
5.0000 mg | ORAL_TABLET | ORAL | Status: DC | PRN
  Administered 2022-09-23: 5 mg via ORAL
  Filled 2022-09-23: qty 1

## 2022-09-23 MED ORDER — IOHEXOL 350 MG/ML SOLN
100.0000 mL | Freq: Once | INTRAVENOUS | Status: AC | PRN
  Administered 2022-09-23: 100 mL via INTRAVENOUS

## 2022-09-23 MED ORDER — HYDROCODONE-ACETAMINOPHEN 5-325 MG PO TABS
1.0000 | ORAL_TABLET | Freq: Once | ORAL | Status: AC
  Administered 2022-09-23: 1 via ORAL
  Filled 2022-09-23: qty 1

## 2022-09-23 MED ORDER — DOXYCYCLINE HYCLATE 100 MG PO TABS
100.0000 mg | ORAL_TABLET | Freq: Once | ORAL | Status: AC
  Administered 2022-09-23: 100 mg via ORAL
  Filled 2022-09-23: qty 1

## 2022-09-23 MED ORDER — GABAPENTIN 300 MG PO CAPS
300.0000 mg | ORAL_CAPSULE | Freq: Once | ORAL | Status: AC
  Administered 2022-09-23: 300 mg via ORAL
  Filled 2022-09-23: qty 1

## 2022-09-23 NOTE — Evaluation (Signed)
Physical Therapy Evaluation Patient Details Name: Kelsey Jacobs MRN: [id number] DOB: 08-05-1946 Today's Date: 09/23/2022  History of Present Illness  76 yo female admitted with AMS, weakness, fall at home (per chart, husband gently lowered her to the ground). 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 Hx of shingles, MRSA, basal cell Ca, [name], chronic pain  Clinical Impression  On eval in ED, pt required Mod A for bed mobility and Min A to stand and to take side steps along the bedside with a RW. Mobility is limited by pain and poor activity tolerance currently. Pt refused to attempt ambulation despite encouragement-she did not feel like she could tolerate it. No family was present during session. At this time, PT recommendation is for ST SNF rehab (depending on continued progress and pt's ability to demonstrate safe mobility with staff). Strongly encouraged pt to try to get up again later and to try to ambulate with nursing staff with husband present.  Will plan to follow pt during this hospital stay.      Recommendations for follow up therapy are one component of a multi-disciplinary discharge planning process, led by the attending physician.  Recommendations may be updated based on patient status, additional functional criteria and insurance authorization.  Follow Up Recommendations Skilled nursing-short term rehab (<3 hours/day) (depending on progress and pt's ability to demonstrate safe mobility with staff) Can patient physically be transported by private vehicle: No    Assistance Recommended at Discharge Frequent or constant Supervision/Assistance  Patient can return home with the following  A lot of help with walking and/or transfers;A lot of help with bathing/dressing/bathroom;Help with stairs or ramp for entrance;Assistance with cooking/housework;Assist for transportation    Equipment Recommendations None recommended by PT  Recommendations for Other Services  OT consult     Functional Status Assessment Patient has had a recent decline in their functional status and demonstrates the ability to make significant improvements in function in a reasonable and predictable amount of time.     Precautions / Restrictions Precautions Precautions: Fall Restrictions Weight Bearing Restrictions: No      Mobility  Bed Mobility Overal bed mobility: Needs Assistance Bed Mobility: Supine to Sit, Sit to Supine     Supine to sit: Mod assist, HOB elevated Sit to supine: Mod assist, HOB elevated   General bed mobility comments: Multiple attempts to get pt sitting EOB-pt would abruply fall back into supine 2* pain. Max encouragement for pt to sit EOB. Assist for trunk and bil LEs. Increased time.Cues provided.    Transfers Overall transfer level: Needs assistance Equipment used: Rolling walker (2 wheels) Transfers: Sit to/from Stand Sit to Stand: Min assist, From elevated surface           General transfer comment: Small amount of assist to rise into standing. Cues for safety.    Ambulation/Gait Ambulation/Gait assistance: Min assist   Assistive device: Rolling walker (2 wheels)         General Gait Details: Pt refused to attempt ambulation despite encouragement-she did not feel like she could tolerate it. She did take several sides steps up towards [facility] with the RW. Cues for safety, technique.  Stairs            Wheelchair Mobility    Modified Rankin (Stroke Patients Only)       Balance Overall balance assessment: Needs assistance, History of Falls         Standing balance support: Bilateral upper extremity supported, Reliant on assistive device  for balance, During functional activity Standing balance-Leahy Scale: Poor                               Pertinent Vitals/Pain Pain Assessment Pain Assessment: Faces Faces Pain Scale: Hurts whole lot Pain Location: L hip with activity Pain Descriptors / Indicators: Grimacing,  Guarding Pain Intervention(s): Limited activity within patient's tolerance, Monitored during session, Repositioned    Home Living Family/patient expects to be discharged to:: Private residence Living Arrangements: Spouse/significant other Available Help at Discharge: Family;Available 24 hours/day Type of Home: House Home Access: Stairs to enter Entrance Stairs-Rails: None Entrance Stairs-Number of Steps: 3   Home Layout: One level Home Equipment: Agricultural consultant (2 wheels);BSC/3in1      Prior Function               Mobility Comments: using RW since hip surgery       Hand Dominance        Extremity/Trunk Assessment   Upper Extremity Assessment Upper Extremity Assessment: Defer to OT evaluation    Lower Extremity Assessment Lower Extremity Assessment: Generalized weakness    Cervical / Trunk Assessment Cervical / Trunk Assessment: Normal  Communication   Communication: No difficulties  Cognition Arousal/Alertness: Awake/alert Behavior During Therapy: WFL for tasks assessed/performed Overall Cognitive Status: Impaired/Different from baseline Area of Impairment: Orientation                 Orientation Level: Disoriented to, Time             General Comments: A&O x 3-unable to correctly recall date        General Comments      Exercises Total Joint Exercises Ankle Circles/Pumps: AROM, Both, 5 reps, Supine Heel Slides: AAROM, Left, 5 reps, Supine   Assessment/Plan    PT Assessment Patient needs continued PT services  PT Problem List Decreased strength;Decreased mobility;Decreased range of motion;Decreased activity tolerance;Decreased balance;Decreased knowledge of use of DME;Pain       PT Treatment Interventions DME instruction;Gait training;Therapeutic activities;Therapeutic exercise;Patient/family education;Balance training;Functional mobility training    PT Goals (Current goals can be found in the Care Plan section)  Acute Rehab PT  Goals Patient Stated Goal: less pain PT Goal Formulation: With patient Time For Goal Achievement: 10/07/22 Potential to Achieve Goals: Good    Frequency Min 3X/week     Co-evaluation               AM-PAC PT "6 Clicks" Mobility  Outcome Measure Help needed turning from your back to your side while in a flat bed without using bedrails?: A Lot Help needed moving from lying on your back to sitting on the side of a flat bed without using bedrails?: A Lot Help needed moving to and from a bed to a chair (including a wheelchair)?: A Little Help needed standing up from a chair using your arms (e.g., wheelchair or bedside chair)?: A Little Help needed to walk in hospital room?: A Little Help needed climbing 3-5 steps with a railing? : A Lot 6 Click Score: 15    End of Session Equipment Utilized During Treatment: Gait belt Activity Tolerance: Patient limited by pain;Patient limited by fatigue Patient left: in bed;with call bell/phone within reach   PT Visit Diagnosis: Difficulty in walking, not elsewhere classified (R26.2);Pain;History of falling (Z91.81) Pain - Right/Left: Left Pain - part of body: Hip    Time: 7989-2119 PT Time Calculation (min) (ACUTE ONLY): 24 min  Charges:   PT Evaluation $PT Eval Moderate Complexity: 1 Mod PT Treatments $Therapeutic Activity: 8-22 mins           Doreatha Massed, PT Acute Rehabilitation  Office: [phone] Pager: [phone]

## 2022-09-23 NOTE — ED Provider Notes (Signed)
Patient ambulated well here.  Patient wants to go home.  Game plan was that if she was able to ambulate and desired to go home patient could be discharged for home.   Fredia Sorrow, MD 09/23/22 Vernelle Emerald

## 2022-09-23 NOTE — ED Notes (Signed)
Pt ambulated approximately 20 feet with walker with standby assist, reported feeling dizzy upon returning to the bed.

## 2022-09-23 NOTE — ED Provider Notes (Signed)
Physical Exam  BP (!) 131/47   Pulse 69   Temp 98.1 F (36.7 C) (Oral)   Resp 18   Ht 5\' 2"  (1.575 m)   Wt 94.3 kg   SpO2 94%   BMI 38.04 kg/m   Physical Exam Vitals and nursing note reviewed.  Constitutional:      General: She is not in acute distress.    Appearance: She is well-developed.  HENT:     Head: Normocephalic and atraumatic.  Eyes:     Conjunctiva/sclera: Conjunctivae normal.  Cardiovascular:     Rate and Rhythm: Normal rate and regular rhythm.     Heart sounds: No murmur heard. Pulmonary:     Effort: Pulmonary effort is normal. No respiratory distress.     Breath sounds: Normal breath sounds.  Abdominal:     Palpations: Abdomen is soft.     Tenderness: There is no abdominal tenderness.  Musculoskeletal:        General: No swelling.     Cervical back: Neck supple.  Skin:    General: Skin is warm and dry.     Capillary Refill: Capillary refill takes less than 2 seconds.  Neurological:     Mental Status: She is alert.  Psychiatric:        Mood and Affect: Mood normal.     Procedures  Procedures  ED Course / MDM   Clinical Course as of 09/23/22 1545  Mon Sep 23, 2022  0012 Rectal temp is elevated, WBC is normal, no signs of UTI. Initial CXR is clear, awaiting CTA to eval PE or occult PNA.  [CS]  4967 Covid/Flu are neg [CS]  5916 CTA is neg for signs of infection. I had a long discussion with patient, husband and son at bedside. She seems to be more alert now, but still confused some (trying to take off BP cuff, answering questions incorrectly). There is some erythema and warmth surrounding her surgical incision but no drainage or wound dehiscence, early cellulitis may explain the fever she had. She is also complaining of significant pain in her L hip. I suspect that her AMS is due to polypharmacy, but no other indication for medical admission at this time. Ultimately, the patient and family would like to go home where she is scheduled to have home  physical therapy but she is not back to baseline or able to stand/walk safely at this point. Will continue to hold in the ED overnight, will reassess in AM and if she continues to improve, may be able to go home then. Otherwise she may require PT and TOC eval for short term rehab placement.  [CS]  0700 Care of the patient signed out to Dr. Matilde Sprang at the change of shift.  [CS]  0706 If she is back to normal today, ok to go home, if not TOC vs med admit, put on keflex if going home [MK]    Clinical Course User Index [CS] Truddie Hidden, MD [MK] Antoninette Lerner, Debe Coder, MD   Medical Decision Making Amount and/or Complexity of Data Reviewed Labs: ordered. Radiology: ordered.  Risk OTC drugs. Prescription drug management.   Patient received in handoff.  Recent hip replacement at [facility] and aborted in the emergency department overnight due to altered mental status suspected to be secondary to polypharmacy.  Throughout her hospital stay in the emergency department today her mental status has improved greatly but she is very hesitant to work with physical therapy secondary to pain.  I was able  to pain control her with her gabapentin and oxycodone, but ambulation still difficult.  I do not think the patient would benefit from muscle relaxers since these provide no pain control and only sedation by nature.  The patient will attempt to continue to ambulate here in the emergency department but if unable, will likely require short-term rehab stay.  TOC consult placed       Glendora Score, MD 09/23/22 1547

## 2022-09-23 NOTE — ED Notes (Signed)
Pt recommended by MD to complete trial ambulation. Two staff members at bedside to assist. Pt attempted to sit up x3 to the EOB at bedside with assist x2. Each time pt fell back onto the bed. After third time pt fell back and stated, "I cant do this, I cant, I'm sorry."

## 2022-09-23 NOTE — Discharge Instructions (Signed)
Return for any new or worse symptoms.  Follow-up with your doctors. °

## 2022-09-24 LAB — URINE CULTURE: Culture: NO GROWTH

## 2022-10-11 ENCOUNTER — Other Ambulatory Visit (HOSPITAL_COMMUNITY): Payer: Medicare Other

## 2022-10-23 ENCOUNTER — Ambulatory Visit: Admit: 2022-10-23 | Payer: Medicare Other | Admitting: Orthopedic Surgery

## 2022-10-23 SURGERY — ARTHROPLASTY, HIP, TOTAL, ANTERIOR APPROACH
Anesthesia: Choice | Site: Hip | Laterality: Left

## 2022-10-28 DIAGNOSIS — Z96642 Presence of left artificial hip joint: Secondary | ICD-10-CM | POA: Insufficient documentation

## 2022-11-08 DIAGNOSIS — I63512 Cerebral infarction due to unspecified occlusion or stenosis of left middle cerebral artery: Secondary | ICD-10-CM | POA: Insufficient documentation

## 2022-11-11 DIAGNOSIS — Z789 Other specified health status: Secondary | ICD-10-CM | POA: Insufficient documentation

## 2022-11-11 DIAGNOSIS — I10 Essential (primary) hypertension: Secondary | ICD-10-CM | POA: Insufficient documentation

## 2022-11-27 ENCOUNTER — Other Ambulatory Visit: Payer: Self-pay

## 2022-11-27 ENCOUNTER — Encounter: Payer: Self-pay | Admitting: Speech Pathology

## 2022-11-27 ENCOUNTER — Ambulatory Visit: Payer: Medicare Other | Attending: Nurse Practitioner | Admitting: Speech Pathology

## 2022-11-27 DIAGNOSIS — R4701 Aphasia: Secondary | ICD-10-CM | POA: Diagnosis present

## 2022-11-27 DIAGNOSIS — R2681 Unsteadiness on feet: Secondary | ICD-10-CM | POA: Diagnosis present

## 2022-11-27 DIAGNOSIS — I69351 Hemiplegia and hemiparesis following cerebral infarction affecting right dominant side: Secondary | ICD-10-CM | POA: Insufficient documentation

## 2022-11-27 DIAGNOSIS — M6281 Muscle weakness (generalized): Secondary | ICD-10-CM | POA: Insufficient documentation

## 2022-11-27 DIAGNOSIS — R2689 Other abnormalities of gait and mobility: Secondary | ICD-10-CM | POA: Diagnosis present

## 2022-11-27 NOTE — Patient Instructions (Signed)
   Talk Path app on I pad  Practice writing

## 2022-11-27 NOTE — Therapy (Signed)
OUTPATIENT SPEECH LANGUAGE PATHOLOGY APHASIA EVALUATION   Patient Name: Kelsey Jacobs MRN: [id number] DOB:[birth year], 76 y.o., female Today's Date: 12/02/2022  PCP: Alain Honey MD REFERRING PROVIDER: Basilia Jumbo, FNP  END OF SESSION:   Past Medical History:  Diagnosis Date   Medical history non-contributory    Past Surgical History:  Procedure Laterality Date   BACK SURGERY     FOOT SURGERY     INCISION AND DRAINAGE ABSCESS Left 03/29/2021   Procedure: INCISION AND DRAINAGE AXILLARY ABSCESS;  Surgeon: Griselda Miner, MD;  Location: [facility] ORS;  Service: General;  Laterality: Left;   Patient Active Problem List   Diagnosis Date Noted   Axillary abscess 03/30/2021    ONSET DATE: 11/06/22   REFERRING DIAG: W65.681 (ICD-10-CM) - Arterial ischemic stroke, MCA (middle cerebral artery), left, acute (HCC) Z74.09 (ICD-10-CM) - Other reduced mobility Z78.9 (ICD-10-CM) - Other specified health status I69.320 (ICD-10-CM) - Aphasia following cerebral infarction  THERAPY DIAG:  Aphasia  Rationale for Evaluation and Treatment: Rehabilitation  SUBJECTIVE:   SUBJECTIVE STATEMENT: "We have been offering choices" Pt accompanied by: family member son [name], spouse Al attended last 10 minutes of eval  PERTINENT HISTORY: 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 Second stroke. She received IPR at [facility]. Prior working as Advertising account planner, independent.  PAIN:  Are you having pain? No  FALLS: Has patient fallen in last 6 months?  No  LIVING ENVIRONMENT: Lives with: lives with their family Lives in: House/apartment  PLOF:  Level of assistance: Independent with ADLs, Independent with IADLs Employment: Full-time employment  PATIENT GOALS: "To talk"  OBJECTIVE:    COGNITION: Overall cognitive status: Impaired Areas of impairment:  Attention: Impaired: Alternating Memory: Impaired: Short term   AUDITORY COMPREHENSION: Overall auditory comprehension:  Impaired: moderately complex YES/NO questions: Impaired: moderately complex Following directions: Impaired: moderately complex Conversation: Simple and Moderately Complex Interfering components: processing speed Effective technique: extra processing time, pausing, slowed speech, written cues, stressing words, and visual/gestural cues  READING COMPREHENSION: Impaired: phrase  EXPRESSION: verbal  VERBAL EXPRESSION: Level of generative/spontaneous verbalization: word Automatic speech: counting: impaired and day of week: impaired  Repetition: Impaired: Word Naming: Responsive: 0-25%, Confrontation: 0-25%, and Convergent: 0-25% Pragmatics: Appears intact Comments:  Interfering components:    Effective technique: sentence completion and articulatory cues Non-verbal means of communication: gestures and not using low tech AAC, family working on salient gestures   WRITTEN EXPRESSION: Dominant hand: right Written expression: Impaired: word  MOTOR SPEECH: Overall motor speech: impaired Level of impairment: Word Respiration: thoracic breathing Phonation: normal Resonance: WFL Articulation: Appears intact Intelligibility: Intelligible Motor planning: Impaired: groping for words Motor speech errors: aware Interfering components:    Effective technique: slow rate, pause, pacing, and choral  ORAL MOTOR EXAMINATION: Overall status: WFL Comments:   STANDARDIZED ASSESSMENTS: QAB: Severe  PATIENT REPORTED OUTCOME MEASURES (PROM): Communication Participation Item Bank: completed by son,  rated 0 or "very much" difficult for all items   TODAY'S TREATMENT:  DATE:   11/27/22: Reviewed aphasia education, demonstrated use of written choices, in field of 4, Sruthi successfully made 2/2 personal preference choices. Initiated family training on using personally  relevant words in theraputic activities at home. Personally relevant words list provided for family to fill out and return. Nikita is to work on written expression of family, friends an pets.    PATIENT EDUCATION: Education details: Sees today's treatment and patient instructions, aphasia ed Person educated: Patient, Spouse, and Child(ren) Education method: Explanation, Demonstration, Verbal cues, and Handouts Education comprehension: verbalized understanding, verbal cues required, and needs further education   GOALS: Goals reviewed with patient? Yes  SHORT TERM GOALS: Target date: 12/25/22  Pt will name 5 family members with occasional mod A Baseline: Goal status: INITIAL  2.  Pt will answer personally relevant yes/no questions accurately 85% with multimodal communication Baseline:  Goal status: INITIAL  3.  Pt will ID 5 food and shopping preferences using multimodal communication with occasional min A Baseline:  Goal status: INITIAL  4.  Pt will name 6 items in personally relevant categories using AAC/multimodal communication as needed with occasional mod A Baseline:  Goal status: INITIAL  5.  Family/caregivers will carryover 3 communication strategies to support pt's comprehension and expression Baseline:  Goal status: INITIAL   LONG TERM GOALS: Target date: 02/18/22  Pt will initiate use mulitmodal communication to communicate wants/needs 3x over 1 week with usual min A from caregivers Baseline:  Goal status: INITIAL  2.  Pt or caregivers will customize 4 items on AAC system with rare min A  Baseline:  Goal status: INITIAL  3.  Pt will use multimodal communication to communicate 3 grocery items and 3 personal care items she needs with occasional min A from family Baseline:  Goal status: INITIAL  4.  Pt will access online shopping site she uses and purchase a needed item with occasional  min A from family Baseline:  Goal status: INITIAL  5.  Using compensatory  strategies and multimodal communication, pt will take 3turns in conversation with occasional min A Baseline:  Goal status: INITIAL  ASSESSMENT:  CLINICAL IMPRESSION: Patient is a 76 y.o. female who was seen today for aphasia. 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 She named 0/6 items, with 2 incidents of phonemic paraphasias, bite/book and beak/book. Other responses were facial expressions of frustration and non verbal. Spontaneous speech is limited to 1-2 words with groping and halting. She read 0/6 items on QAB, again with phonemic paraphasia of pity/pig. Shaketta repeated 0/6 items on QAB. She is inconsistent with yes/no's, family is attempting to help her use thumbs up and down for this. She is not using salient gestures consistently. Prior to CVA, Freyja shopped and looked up recipes on line using her computer. They do not have a tablet. She wrote her name, her son's and spouses name. She was not able to write the pets name or other friends/family members. Communication is frustrating for her and her family. I recommend skilled ST to maximize communication for safety, QOL, independence and to reduce caregiver burden. She needs OT and PT as well, family stated they felt this would be too much at once OBJECTIVE IMPAIRMENTS: include attention, memory, aphasia, and apraxia. These impairments are limiting patient from return to work, managing medications, managing appointments, managing finances, household responsibilities, ADLs/IADLs,  and effectively communicating at home and in community. Factors affecting potential to achieve goals and functional outcome are severity of impairments. Patient will benefit from skilled SLP services to address above impairments and improve overall function.  REHAB POTENTIAL: Good  PLAN:  SLP FREQUENCY: 2x/week  SLP  DURATION: 12 weeks  PLANNED INTERVENTIONS: Language facilitation, Environmental controls, Cueing hierachy, Cognitive reorganization, Internal/external aids, Functional tasks, Multimodal communication approach, SLP instruction and feedback, Compensatory strategies, and Patient/family education    Fallan, Mccarey, CCC-SLP 12/02/2022, 5:54 PM

## 2022-12-04 NOTE — Therapy (Signed)
OUTPATIENT SPEECH LANGUAGE PATHOLOGY TREATMENT   Patient Name: Kelsey Jacobs MRN: [id number] DOB:1946/11/14, 76 y.o., female Today's Date: 12/05/2022  PCP: Alain Honey MD REFERRING PROVIDER: Basilia Jumbo, FNP  END OF SESSION:  End of Session - 12/05/22 0756     Visit Number 2    Number of Visits 25    Date for SLP Re-Evaluation 02/19/23    Authorization Type medicare    SLP Start Time 0800    SLP Stop Time  0845    SLP Time Calculation (min) 45 min    Activity Tolerance Patient tolerated treatment well             Past Medical History:  Diagnosis Date   Medical history non-contributory    Past Surgical History:  Procedure Laterality Date   BACK SURGERY     FOOT SURGERY     INCISION AND DRAINAGE ABSCESS Left 03/29/2021   Procedure: INCISION AND DRAINAGE AXILLARY ABSCESS;  Surgeon: Griselda Miner, MD;  Location: [facility];  Service: General;  Laterality: Left;   Patient Active Problem List   Diagnosis Date Noted   Axillary abscess 03/30/2021    ONSET DATE: 11/06/22   REFERRING DIAG: E08.144 (ICD-10-CM) - Arterial ischemic stroke, MCA (middle cerebral artery), left, acute (HCC) Z74.09 (ICD-10-CM) - Other reduced mobility Z78.9 (ICD-10-CM) - Other specified health status I69.320 (ICD-10-CM) - Aphasia following cerebral infarction  THERAPY DIAG:  Aphasia  Rationale for Evaluation and Treatment: Rehabilitation  SUBJECTIVE:   SUBJECTIVE STATEMENT: "Morning"  PAIN:  Are you having pain? No  OBJECTIVE:   TODAY'S TREATMENT:   12-05-22: Pt has been working on [employer]. Demonstrates ability to write family member names, demonstrates perseveration of erroneous letters in attempts to answer other, personally relevant questions which require one word response. SLP demonstrates supportive communication strategies of providing options and confirming choice. Son endorses improvement in choice making from [place], verbally presented, at home. In naming task, pt able to  approximate intelligible production of 3/10 items. Approximations are laborious, with usual paraphasias and occasional neologisms. Pt with usual awareness, evidences frustration at challenges. SLP introduces pt to [employer] touch-talk as alternative communication option, as pt's natural speech is not meeting her communication needs currently and SLP suspects extended duration of recovery possible d/t severity of deficits. Pt able to select dictated icon 4/4 trials. Pt able to answer personally relevant questions, with apparent accuracy which was confirmed by SLP use of confirmation communication technique and son's knowledge, in 5/5 trials. Education provided on recommendation for speech generating device and how said device can support pt's communication impairments and subsequent recovery. Pt and son verbalize understanding, are interested in a trial. SLP to order pt a trial device.   11/27/22: Reviewed aphasia education, demonstrated use of written choices, in field of 4, [name] successfully made 2/2 personal preference choices. Initiated family training on using personally relevant words in theraputic activities at home. Personally relevant words list provided for family to fill out and return. [name] is to work on written expression of family, friends an pets.    PATIENT EDUCATION: Education details: Sees today's treatment and patient instructions, aphasia ed Person educated: Patient, Spouse, and Child(ren) Education method: Explanation, Demonstration, Verbal cues, and Handouts Education comprehension: verbalized understanding, verbal cues required, and needs further education   GOALS: Goals reviewed with patient? Yes  SHORT TERM GOALS: Target date: 12/25/22  Pt will name 5 family members with occasional mod A Baseline: Goal status: IN PROGRESS  2.  Pt will answer  personally relevant yes/no questions accurately 85% with multimodal communication Baseline:  Goal status: IN PROGRESS  3.  Pt  will ID 5 food and shopping preferences using multimodal communication with occasional min A Baseline:  Goal status: IN PROGRESS  4.  Pt will name 6 items in personally relevant categories using AAC/multimodal communication as needed with occasional mod A Baseline:  Goal status: IN PROGRESS  5.  Family/caregivers will carryover 3 communication strategies to support pt's comprehension and expression Baseline:  Goal status: IN PROGRESS   LONG TERM GOALS: Target date: 02/18/22  Pt will initiate use mulitmodal communication to communicate wants/needs 3x over 1 week with usual min A from caregivers Baseline:  Goal status: IN PROGRESS  2.  Pt or caregivers will customize 4 items on AAC system with rare min A  Baseline:  Goal status: IN PROGRESS  3.  Pt will use multimodal communication to communicate 3 grocery items and 3 personal care items she needs with occasional min A from family Baseline:  Goal status: IN PROGRESS  4.  Pt will access online shopping site she uses and purchase a needed item with occasional  min A from family Baseline:  Goal status: IN PROGRESS  5.  Using compensatory strategies and multimodal communication, pt will take 3turns in conversation with occasional min A Baseline:  Goal status: IN PROGRESS  ASSESSMENT:  CLINICAL IMPRESSION: Patient is a 76 y.o. female who was seen today for aphasia. 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 She named 0/6 items, with 2 incidents of phonemic paraphasias, bite/book and beak/book. Other responses were facial expressions of frustration and non verbal. Spontaneous speech is limited to 1-2 words with groping and halting. She read 0/6 items on QAB, again with phonemic paraphasia of pity/pig. [name] repeated 0/6 items on QAB. She is inconsistent with yes/no's, family is attempting  to help her use thumbs up and down for this. She is not using salient gestures consistently. Prior to CVA, [name] shopped and looked up recipes on line using her computer. They do not have a tablet. She wrote her name, her son's and spouses name. She was not able to write the pets name or other friends/family members. Communication is frustrating for her and her family. I recommend skilled ST to maximize communication for safety, QOL, independence and to reduce caregiver burden. She needs OT and PT as well, family stated they felt this would be too much at once  OBJECTIVE IMPAIRMENTS: include attention, memory, aphasia, and apraxia. These impairments are limiting patient from return to work, managing medications, managing appointments, managing finances, household responsibilities, ADLs/IADLs, and effectively communicating at home and in community. Factors affecting potential to achieve goals and functional outcome are severity of impairments. Patient will benefit from skilled SLP services to address above impairments and improve overall function.  REHAB POTENTIAL: Good  PLAN:  SLP FREQUENCY: 2x/week  SLP DURATION: 12 weeks  PLANNED INTERVENTIONS: Language facilitation, Environmental controls, Cueing hierachy, Cognitive reorganization, Internal/external aids, Functional tasks, Multimodal communication approach, SLP instruction and feedback, Compensatory strategies, and Patient/family education    Maia Breslow, CCC-SLP 12/05/2022, 7:58 AM

## 2022-12-05 ENCOUNTER — Ambulatory Visit: Payer: Medicare Other | Admitting: Physical Therapy

## 2022-12-05 ENCOUNTER — Other Ambulatory Visit: Payer: Self-pay

## 2022-12-05 ENCOUNTER — Ambulatory Visit: Payer: Medicare Other | Admitting: Speech Pathology

## 2022-12-05 ENCOUNTER — Encounter: Payer: Self-pay | Admitting: Physical Therapy

## 2022-12-05 VITALS — BP 136/70 | HR 57

## 2022-12-05 DIAGNOSIS — R2681 Unsteadiness on feet: Secondary | ICD-10-CM

## 2022-12-05 DIAGNOSIS — R4701 Aphasia: Secondary | ICD-10-CM

## 2022-12-05 DIAGNOSIS — R2689 Other abnormalities of gait and mobility: Secondary | ICD-10-CM

## 2022-12-05 DIAGNOSIS — I69351 Hemiplegia and hemiparesis following cerebral infarction affecting right dominant side: Secondary | ICD-10-CM

## 2022-12-05 DIAGNOSIS — M6281 Muscle weakness (generalized): Secondary | ICD-10-CM

## 2022-12-05 NOTE — Therapy (Signed)
OUTPATIENT PHYSICAL THERAPY NEURO EVALUATION   Patient Name: Kelsey Jacobs MRN: [id number] DOB:June 17, 1946, 76 y.o., female Today's Date: 12/06/2022   PCP: Dr. Jaye Beagle [facility]) REFERRING PROVIDER: Renato Gails, MD  END OF SESSION:  PT End of Session - 12/05/22 0857     Visit Number 1    Number of Visits 17   16+eval   Date for PT Re-Evaluation 02/07/23   pushed out due to potential scheduling delay   Authorization Type MEDICARE PART A AND B    PT Start Time 0846   handoff from ST   PT Stop Time 0930    PT Time Calculation (min) 44 min    Activity Tolerance Patient tolerated treatment well    Behavior During Therapy [facility] for tasks assessed/performed             Past Medical History:  Diagnosis Date   Medical history non-contributory    Past Surgical History:  Procedure Laterality Date   BACK SURGERY     FOOT SURGERY     INCISION AND DRAINAGE ABSCESS Left 03/29/2021   Procedure: INCISION AND DRAINAGE AXILLARY ABSCESS;  Surgeon: Griselda Miner, MD;  Location: [facility];  Service: General;  Laterality: Left;   Patient Active Problem List   Diagnosis Date Noted   Axillary abscess 03/30/2021    ONSET DATE: 11/28/2022  REFERRING DIAG: R26.9 (ICD-10-CM) - Gait abnormality  THERAPY DIAG:  Hemiplegia and hemiparesis following cerebral infarction affecting right dominant side (HCC)  Other abnormalities of gait and mobility  Muscle weakness (generalized)  Unsteadiness on feet  Rationale for Evaluation and Treatment: Rehabilitation  SUBJECTIVE:                                                                                                                                                                                             SUBJECTIVE STATEMENT: Son states patient is having some restlessness in the right leg at night.  She was using RW following her hip surgery, but prior to surgery she did not need an AD.  He further states she needs  a few laps in home to get loosened up and then she does okay.  He states the left hip incision is healed.   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 No  PRECAUTIONS: Fall-anterior hip precautions have been lifted  WEIGHT BEARING RESTRICTIONS: No  FALLS: Has patient fallen in last 6 months? No  LIVING ENVIRONMENT: Lives with: lives with their spouse and  lives with their son-son will be leaving eventually, but is there indefinitely  Lives in: House/apartment Stairs: Yes: Internal: 15 steps; on left going up and External: 2 steps; uses handheld assist and door frame Has following equipment at home: Quad cane small base, Walker - 2 wheeled, bed side commode, Grab bars, and transport chair, chair alarm, bed alarm, call button for son, insertable bed rails  PLOF: Independent with transfers, Needs assistance with ADLs, Needs assistance with homemaking, Needs assistance with gait, and typically has supervision with transfers  PATIENT GOALS: PT asks pt if she would like to get stronger to which she nods.  Son would like for mom to get back to her normal routine safely.  And to be able to leave her alone and know she is safe and for her to transition to the cane just to be a bit more independent.  OBJECTIVE:  VITALS (LUE IN SITTING): Today's Vitals   12/05/22 0850  BP: 136/70  Pulse: (!) 57   DIAGNOSTIC FINDINGS: Head CT from 10/15:  Normal, no follow-up imaging of head available to PT. DG Hip 10/15 IMPRESSION: Status post total hip arthroplasty without evidence of acute fracture or dislocation.  COGNITION: Overall cognitive status:  expressive aphasia; son indicates overall "sharpness" remains   SENSATION: WFL  COORDINATION: Heel-to-shin: WNL bilaterally  EDEMA:  None noted in BLE  MUSCLE TONE: None noted in BLE  POSTURE: rounded  shoulders, forward head, and increased thoracic kyphosis  LOWER EXTREMITY ROM:     Active  Right Eval Left Eval  Hip flexion Grossly WFL  Hip extension   Hip abduction   Hip adduction   Hip internal rotation   Hip external rotation   Knee flexion   Knee extension   Ankle dorsiflexion   Ankle plantarflexion   Ankle inversion   Ankle eversion    (Blank rows = not tested)  LOWER EXTREMITY MMT:    MMT Right Eval Left Eval  Hip flexion Functionally assessed and grossly 4/5  Hip extension   Hip abduction   Hip adduction   Hip internal rotation   Hip external rotation   Knee flexion   Knee extension   Ankle dorsiflexion   Ankle plantarflexion   Ankle inversion   Ankle eversion   (Blank rows = not tested)  BED MOBILITY:  Sit to supine Complete Independence, SBA, and Min A Supine to sit Complete Independence, SBA, and Min A  TRANSFERS: Assistive device utilized: Environmental consultant - 2 wheeled  Sit to stand: Modified independence Stand to sit: Modified independence Chair to chair: SBA  GAIT: Gait pattern: step through pattern, decreased stride length, decreased hip/knee flexion- Right, decreased hip/knee flexion- Left, trunk flexed, and narrow BOS Distance walked: 125' Assistive device utilized: Environmental consultant - 2 wheeled Level of assistance: SBA  FUNCTIONAL TESTS:  5 times sit to stand: 16.86sec w/ BUE support 10 meter walk test: 15.85sec w/ RW = 0.63 m/sec OR 2.08 ft/sec Berg Balance Scale: TBD  PATIENT SURVEYS:  FOTO To be assessed.  TODAY'S TREATMENT:  DATE: N/A    PATIENT EDUCATION: Education details: PT POC, assessments used and to be used, outcomes today and goals to be set.  Discussed tracking steps and general activity with son as they count laps in the house, discussed simple ways to progress this. Person educated: Patient and  Child(ren) Education method: Explanation and Demonstration Education comprehension: verbalized understanding, returned demonstration, and needs further education  HOME EXERCISE PROGRAM: To be established.  Encouraged son to bring hip HEP from post-op rehab to next appt.  GOALS: Goals reviewed with patient? Yes  SHORT TERM GOALS: Target date: 01/03/2023  Pt will perform strength, stretching, and balance HEP with supervision from family to maintain progress. Baseline:  To be established. Goal status: INITIAL  2.  BERG to be assessed w/ STG/LTG set as appropriate. Baseline: To be assessed. Goal status: INITIAL  3.  Pt will decrease 5xSTS to </=13 seconds w/ BUE support in order to demonstrate decreased risk for falls and improved functional bilateral LE strength and power. Baseline: 16.86 sec w/ BUE support Goal status: INITIAL  4.  Pt will demonstrate a gait speed of >/=2.5 feet/sec using LRAD in order to decrease risk for falls. Baseline: 2.08 ft/sec w/ RW Goal status: INITIAL  5.  Pt will negotiation 2 stairs w/o rail using LRAD at no more than supervision level in order to improve safe accessibility to home environment. Baseline: Pt needs minA at home to access home via garage or front door. Goal status: INITIAL  LONG TERM GOALS: Target date: 01/31/2023  FOTO to be assessed w/ LTG set. Baseline: To be assessed. Goal status: INITIAL  2.  BERG to be assessed w/ STG/LTG set as appropriate. Baseline: To be assessed. Goal status: INITIAL  3.  Pt will decrease 5xSTS to </=13 seconds w/o BUE support in order to demonstrate decreased risk for falls and improved functional bilateral LE strength and power. Baseline: 16.86 sec w/ BUE support Goal status: INITIAL  4.  Pt will demonstrate a gait speed of >/=2.98 feet/sec using LRAD in order to decrease risk for falls. Baseline: 2.08 ft/sec w/ RW Goal status: INITIAL  5.  Pt will ambulate >/=500' over level indoor surfaces at modI  level to improve endurance and safety with household mobility. Baseline: 125' w/ RW SBA Goal status: INITIAL  ASSESSMENT:  CLINICAL IMPRESSION: Patient is a 76 y.o. female who was seen today for physical therapy evaluation and treatment for left MCA CVA.  Pt has a significant PMH of left anterior THA on 09/19/2022, HTN, hyperlipidemia, and basal cell carcinoma.  Identified impairments include expressive aphasia, reliance on RW for ambulatory stability, decreased stance on RLE, mild bilateral LE weakness, likely age related postural changes, intermittent assistance with transfers including bed mobility, and intermittent possible nerve pain in the RLE per son reports (pt not indicating this during eval).  Evaluation via the following assessment tools: 5xSTS and <MEASUREM295Silas [place]as11548-31[facility]<MEASUREMEN295Silas TerressaKentuckNeSKentuckyebas11612-86[facility] [place]1<MEASUREMEN295Silas TerressaKentuckNeSKentuckyebas11(61[place]4<MEASUREMEN295Silas Terressa[place]entuckNeSKentuckyebas11(702) 12[facility]7<MEASUREMEN295Silas Terressa[place]entuckNeSKentuckyebas11(606) 52[facility]2<MEASUREMEN295Silas Terressa[place]entuckNeSKentuckyebas11([phone])76[facility]<MEASUREMEN295Silas TerressaKentuckNeSKentuckyebas11([phone])07[facility]4<MEASUREMEN295Silas TerressaKentuckNeSKentuckyebas11431-60[facility]7<MEASUREMEN295Silas Terressa[place]ent[place]NeSKentuckyebas11(702) 62[facility] AndMary[facility]4<MEASUREMEN295Silas Terressa[place]entuckNeSKentuckyebas116396[facility] Lake[facility]land18m31 Surgical Suitesed fall risk.  Will further assess fall risk with BERG next session and will establish FOTO baseline. She would benefit from skilled PT to address impairments as noted and progress towards long term goals.  OBJECTIVE IMPAIRMENTS: Abnormal gait, decreased activity tolerance, decreased balance, decreased strength, increased muscle spasms, improper body mechanics, and postural dysfunction.   ACTIVITY LIMITATIONS: carrying, lifting, bending, squatting, stairs, transfers, bed mobility, and locomotion level  PARTICIPATION LIMITATIONS: meal prep, cleaning, laundry, medication management, driving, shopping, and community activity  PERSONAL FACTORS: Age, Fitness, Past/current experiences, Transportation, and 1-2 comorbidities: recent left THA and HTN  are  also affecting patient's functional outcome.   REHAB POTENTIAL: Good  CLINICAL DECISION MAKING: Stable/uncomplicated  EVALUATION COMPLEXITY: Low  PLAN:  PT FREQUENCY: 2x/week  PT DURATION: 8 weeks  PLANNED INTERVENTIONS: Therapeutic exercises, Therapeutic activity, Neuromuscular re-education, Balance training, Gait training, Patient/Family education, Self Care, Joint  mobilization, Stair training, Vestibular training, DME instructions, Manual therapy, and Re-evaluation  PLAN FOR NEXT SESSION: Assess FOTO (pt can acknowledge yes/no and son may be able to assist if present) and BERG-set goals, review hip rehab HEP if son brings and add/modify as needed.  Discuss walking program in home (son makes pt count laps in home).  Right NMR, gait training (hoping to progress to cane), stair training   Sadie Haber, PT, DPT 12/06/2022, 9:09 AM

## 2022-12-10 ENCOUNTER — Encounter: Payer: Self-pay | Admitting: Physical Therapy

## 2022-12-10 ENCOUNTER — Ambulatory Visit: Payer: Medicare Other | Attending: Nurse Practitioner | Admitting: Speech Pathology

## 2022-12-10 ENCOUNTER — Ambulatory Visit: Payer: Medicare Other | Admitting: Physical Therapy

## 2022-12-10 DIAGNOSIS — R2689 Other abnormalities of gait and mobility: Secondary | ICD-10-CM

## 2022-12-10 DIAGNOSIS — I69351 Hemiplegia and hemiparesis following cerebral infarction affecting right dominant side: Secondary | ICD-10-CM | POA: Diagnosis present

## 2022-12-10 DIAGNOSIS — R2681 Unsteadiness on feet: Secondary | ICD-10-CM | POA: Insufficient documentation

## 2022-12-10 DIAGNOSIS — M6281 Muscle weakness (generalized): Secondary | ICD-10-CM | POA: Diagnosis present

## 2022-12-10 DIAGNOSIS — R41842 Visuospatial deficit: Secondary | ICD-10-CM | POA: Diagnosis present

## 2022-12-10 DIAGNOSIS — R41840 Attention and concentration deficit: Secondary | ICD-10-CM | POA: Diagnosis present

## 2022-12-10 DIAGNOSIS — R4701 Aphasia: Secondary | ICD-10-CM | POA: Insufficient documentation

## 2022-12-10 NOTE — Therapy (Signed)
OUTPATIENT SPEECH LANGUAGE PATHOLOGY TREATMENT   Patient Name: Kelsey Jacobs MRN: [id number] DOB:Nov 23, 1946, 77 y.o., female 82 Date: 12/10/2022  PCP: Larene Beach MD REFERRING PROVIDER: Gwendolyn Lima, FNP  END OF SESSION:  End of Session - 12/10/22 1143     Visit Number 3    Number of Visits 25    Date for SLP Re-Evaluation 02/19/23    Authorization Type medicare    SLP Start Time 5631    SLP Stop Time  1315    SLP Time Calculation (min) 45 min    Activity Tolerance Patient tolerated treatment well              Past Medical History:  Diagnosis Date   Medical history non-contributory    Past Surgical History:  Procedure Laterality Date   BACK SURGERY     FOOT SURGERY     INCISION AND DRAINAGE ABSCESS Left 03/29/2021   Procedure: INCISION AND DRAINAGE AXILLARY ABSCESS;  Surgeon: Jovita Kussmaul, MD;  Location: [facility];  Service: General;  Laterality: Left;   Patient Active Problem List   Diagnosis Date Noted   Axillary abscess 03/30/2021    ONSET DATE: 11/06/22   REFERRING DIAG: S97.026 (ICD-10-CM) - Arterial ischemic stroke, MCA (middle cerebral artery), left, acute ([place]) Z74.09 (ICD-10-CM) - Other reduced mobility Z78.9 (ICD-10-CM) - Other specified health status I69.320 (ICD-10-CM) - Aphasia following cerebral infarction  THERAPY DIAG:  Aphasia  Rationale for Evaluation and Treatment: Rehabilitation  SUBJECTIVE:   SUBJECTIVE STATEMENT: "Good"   PAIN:  Are you having pain? No  OBJECTIVE:   TODAY'S TREATMENT:   12-10-22: Target expression of preferences through employment of low tech multimodal communication. Pt able to relay to SLP level of interest in various activities via 3 selection options (very, somewhat, none) for 24 topics, with pt's non verbal communication and care partners input matching her selection in 100% of trials. Determine dinner preference via binary options. Spouse endroses successful employment of this strategy at home,  they find white board very helpful. Target verbal expression through trials of modified melodic intonation therapy, with pt able to approximate x6/7 fruits given usual modeling, visual and written cues. Visual cues to annotate number of syllables successful in avoiding additional sounds to end of words. Approximations notable for being effortful, inconsistent, and with groping.   12-05-22: Pt has been working on [employer]. Demonstrates ability to write family member names, demonstrates perseveration of erroneous letters in attempts to answer other, personally relevant questions which require one word response. SLP demonstrates supportive communication strategies of providing options and confirming choice. Son endorses improvement in choice making from [place], verbally presented, at home. In naming task, pt able to approximate intelligible production of 3/10 items. Approximations are laborious, with usual paraphasias and occasional neologisms. Pt with usual awareness, evidences frustration at challenges. SLP introduces pt to Freedom touch-talk as alternative communication option, as pt's natural speech is not meeting her communication needs currently and SLP suspects extended duration of recovery possible d/t severity of deficits. Pt able to select dictated icon 4/4 trials. Pt able to answer personally relevant questions, with apparent accuracy which was confirmed by SLP use of confirmation communication technique and son's knowledge, in 5/5 trials. Education provided on recommendation for speech generating device and how said device can support pt's communication impairments and subsequent recovery. Pt and son verbalize understanding, are interested in a trial. SLP to order pt a trial device.   11/27/22: Reviewed aphasia education, demonstrated use of written choices, in  field of 4, Makisha successfully made 2/2 personal preference choices. Initiated family training on using personally relevant words in theraputic  activities at home. Personally relevant words list provided for family to fill out and return. Lache is to work on written expression of family, friends an pets.    PATIENT EDUCATION: Education details: Sees today's treatment and patient instructions, aphasia ed Person educated: Patient, Spouse, and Child(ren) Education method: Explanation, Demonstration, Verbal cues, and Handouts Education comprehension: verbalized understanding, verbal cues required, and needs further education   GOALS: Goals reviewed with patient? Yes  SHORT TERM GOALS: Target date: 12/25/22  Pt will name 5 family members with occasional mod A Baseline: Goal status: IN PROGRESS  2.  Pt will answer personally relevant yes/no questions accurately 85% with multimodal communication Baseline:  Goal status: IN PROGRESS  3.  Pt will ID 5 food and shopping preferences using multimodal communication with occasional min A Baseline:  Goal status: IN PROGRESS  4.  Pt will name 6 items in personally relevant categories using AAC/multimodal communication as needed with occasional mod A Baseline:  Goal status: IN PROGRESS  5.  Family/caregivers will carryover 3 communication strategies to support pt's comprehension and expression Baseline:  Goal status: IN PROGRESS   LONG TERM GOALS: Target date: 02/18/22  Pt will initiate use mulitmodal communication to communicate wants/needs 3x over 1 week with usual min A from caregivers Baseline:  Goal status: IN PROGRESS  2.  Pt or caregivers will customize 4 items on AAC system with rare min A  Baseline:  Goal status: IN PROGRESS  3.  Pt will use multimodal communication to communicate 3 grocery items and 3 personal care items she needs with occasional min A from family Baseline:  Goal status: IN PROGRESS  4.  Pt will access online shopping site she uses and purchase a needed item with occasional  min A from family Baseline:  Goal status: IN PROGRESS  5.  Using  compensatory strategies and multimodal communication, pt will take 3turns in conversation with occasional min A Baseline:  Goal status: IN PROGRESS  ASSESSMENT:  CLINICAL IMPRESSION: Patient is a 77 y.o. female who was seen today for aphasia. 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 She named 0/6 items, with 2 incidents of phonemic paraphasias, bite/book and beak/book. Other responses were facial expressions of frustration and non verbal. Spontaneous speech is limited to 1-2 words with groping and halting. She read 0/6 items on QAB, again with phonemic paraphasia of pity/pig. Darian repeated 0/6 items on QAB. She is inconsistent with yes/no's, family is attempting to help her use thumbs up and down for this. She is not using salient gestures consistently. Prior to CVA, Areil shopped and looked up recipes on line using her computer. They do not have a tablet. She wrote her name, her son's and spouses name. She was not able to write the pets name or other friends/family members. Communication is frustrating for her and her family. I recommend skilled ST to maximize communication for safety, QOL, independence and to reduce caregiver burden. She needs OT and PT as well, family stated they felt this would be too much at once  OBJECTIVE IMPAIRMENTS: include attention, memory, aphasia, and apraxia. These impairments are limiting patient from return to work, managing medications, managing appointments, managing finances, household responsibilities, ADLs/IADLs, and effectively  communicating at home and in community. Factors affecting potential to achieve goals and functional outcome are severity of impairments. Patient will benefit from skilled SLP services to address above impairments and improve overall function.  REHAB POTENTIAL: Good  PLAN:  SLP FREQUENCY:  2x/week  SLP DURATION: 12 weeks  PLANNED INTERVENTIONS: Language facilitation, Environmental controls, Cueing hierachy, Cognitive reorganization, Internal/external aids, Functional tasks, Multimodal communication approach, SLP instruction and feedback, Compensatory strategies, and Patient/family education    Maia Breslow, CCC-SLP 12/10/2022, 11:44 AM

## 2022-12-10 NOTE — Therapy (Signed)
OUTPATIENT PHYSICAL THERAPY NEURO TREATMENT NOTE   Patient Name: Kelsey Jacobs MRN: [id number] DOB:1946/02/20, 77 y.o., female Today's Date: 12/10/2022   PCP: Dr. Michell Heinrich [facility] [place]: Levada Schilling, MD  END OF SESSION:  PT End of Session - 12/10/22 1513     Visit Number 2    Number of Visits 17   16+eval   Date for PT Re-Evaluation 02/07/23   pushed out due to potential scheduling delay   Authorization Type MEDICARE PART A AND B    PT Start Time 1143    PT Stop Time 1227    PT Time Calculation (min) 44 min    Equipment Utilized During Treatment Gait belt    Activity Tolerance Patient tolerated treatment well    Behavior During Therapy WFL for tasks assessed/performed              Past Medical History:  Diagnosis Date   Medical history non-contributory    Past Surgical History:  Procedure Laterality Date   BACK SURGERY     FOOT SURGERY     INCISION AND DRAINAGE ABSCESS Left 03/29/2021   Procedure: INCISION AND DRAINAGE AXILLARY ABSCESS;  Surgeon: Autumn Messing III, MD;  Location: [facility];  Service: General;  Laterality: Left;   Patient Active Problem List   Diagnosis Date Noted   Axillary abscess 03/30/2021    ONSET DATE: 11/28/2022  REFERRING DIAG: R26.9 (ICD-10-CM) - Gait abnormality  THERAPY DIAG:  Other abnormalities of gait and mobility  Unsteadiness on feet  Rationale for Evaluation and Treatment: Rehabilitation  SUBJECTIVE:                                                                                                                                                                                             SUBJECTIVE STATEMENT:   Pt presents to PT accompanied by her husband - using RW; he states pt is transferring by herself at home but has an alarm so they know when she gets up and can provide supervision; he reports pt is negotiating the 2 steps in the garage with use of hand rail with supervision  Pt  accompanied by: family member-husband, Al  PERTINENT HISTORY: 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 No  PRECAUTIONS: Fall-anterior hip precautions have been lifted  WEIGHT BEARING RESTRICTIONS: No  FALLS: Has patient fallen in last 6 months? No  LIVING ENVIRONMENT: Lives with: lives with their spouse and lives with their son-son will be leaving eventually, but is there indefinitely  Lives in: House/apartment  Stairs: Yes: Internal: 15 steps; on left going up and External: 2 steps; uses handheld assist and door frame Has following equipment at home: Quad cane small base, Walker - 2 wheeled, bed side commode, Grab bars, and transport chair, chair alarm, bed alarm, call button for son, insertable bed rails  PLOF: Independent with transfers, Needs assistance with ADLs, Needs assistance with homemaking, Needs assistance with gait, and typically has supervision with transfers  PATIENT GOALS: PT asks pt if she would like to get stronger to which she nods.  Son would like for mom to get back to her normal routine safely.  And to be able to leave her alone and know she is safe and for her to transition to the cane just to be a bit more independent.  OBJECTIVE:   FOTO was started but was not completed by patient's husband (12-10-22)  GAIT: Gait pattern: step through pattern, decreased stride length, decreased hip/knee flexion- Right, decreased hip/knee flexion- Left, trunk flexed, and narrow BOS Distance walked: 72' with SPC (pt carried cane - did not really use for assistance with balance);; 115' 2nd rep without device with CGA Assistive device utilized: [facility] 1st rep; no device 2nd rep Level of assistance: CGA   NMR:    [facility] PT Assessment - 12/10/22 0001       Standardized Balance Assessment   Standardized Balance Assessment Berg Balance Test      Berg Balance Test   Sit to Stand Able to stand   independently using hands    Standing Unsupported Able to stand 2 minutes with supervision    Sitting with Back Unsupported but Feet Supported on Floor or Stool Able to sit safely and securely 2 minutes    Stand to Sit Sits safely with minimal use of hands    Transfers Able to transfer safely, minor use of hands    Standing Unsupported with Eyes Closed Able to stand 10 seconds with supervision    Standing Unsupported with Feet Together Able to place feet together independently and stand for 1 minute with supervision    From Standing, Reach Forward with Outstretched Arm Can reach confidently >25 cm (10")    From Standing Position, Pick up Object from Floor Unable to try/needs assist to keep balance    From Standing Position, Turn to Look Behind Over each Shoulder Looks behind one side only/other side shows less weight shift   . to Lt side   Turn 360 Degrees Able to turn 360 degrees safely but slowly    Standing Unsupported, Alternately Place Feet on Step/Stool Able to complete >2 steps/needs minimal assist    Standing Unsupported, One Foot in Front Able to take small step independently and hold 30 seconds    Standing on One Leg Tries to lift leg/unable to hold 3 seconds but remains standing independently    Total Score 37            Pt performed standing balance exercises at counter - alternating forward, back and side kicks 10 reps each leg each direction  With bil. UE support on counter; pt grimaced with LLE AROM but continued with exercise  Marching in place 10 reps each leg with UE support  Stepping strategy - alternating stepping forward/back 5 reps each leg with RUE support on counter and then stepping laterally 5 reps each with UE support on counter  SLS - 10 sec hold on each leg with bil. UE support on counter - 1 rep each LE  PATIENT  EDUCATION: Education details: added SLS and marching to current HEP (for Lt THA) that she is consistently doing Person educated: Patient and  Child(ren) Education method: Customer service manager; handout from [employer] Education comprehension: verbalized understanding, returned demonstration, and needs further education  HOME EXERCISE PROGRAM: Added marching and SLS to HEP    Marching In-Place - [place]    Standing straight, alternate bringing knees toward trunk. Arms swing alternately. Do __1_ sets. Do _1__ times per day.  10 REPS EACH LEG     SINGLE LIMB STANCE - HOLD ONTO COUNTER FOR SUPPORT   Stance: single leg on floor. Raise leg. Hold _10__ seconds. Repeat with other leg. ___1-2 reps per set, __1_ sets per day, __5_ days per week  Copyright  VHI. All rights reserved.   GOALS: Goals reviewed with patient? Yes  SHORT TERM GOALS: Target date: 01/03/2023  Pt will perform strength, stretching, and balance HEP with supervision from family to maintain progress. Baseline:  To be established. Goal status: INITIAL  2.  BERG to be assessed w/ STG/LTG set as appropriate.  Increase Berg score to >/ 42/56 to demo improvement in balance. Baseline: To be assessed. Goal status: INITIAL  3.  Pt will decrease 5xSTS to </=13 seconds w/ BUE support in order to demonstrate decreased risk for falls and improved functional bilateral LE strength and power. Baseline: 16.86 sec w/ BUE support Goal status: INITIAL  4.  Pt will demonstrate a gait speed of >/=2.5 feet/sec using LRAD in order to decrease risk for falls. Baseline: 2.08 ft/sec w/ RW Goal status: INITIAL  5.  Pt will negotiation 2 stairs w/o rail using LRAD at no more than supervision level in order to improve safe accessibility to home environment. Baseline: Pt needs minA at home to access home via garage or front door. Goal status: INITIAL  LONG TERM GOALS: Target date: 01/31/2023  FOTO to be assessed w/ LTG set. Baseline: To be assessed. Goal status: INITIAL  2.  BERG to be assessed w/ STG/LTG set as appropriate.  Increase Berg score  to >/= 47/56 for reduced fall risk.  Baseline: 37/56 on 12-10-22 Goal status: INITIAL  3.  Pt will decrease 5xSTS to </=13 seconds w/o BUE support in order to demonstrate decreased risk for falls and improved functional bilateral LE strength and power. Baseline: 16.86 sec w/ BUE support Goal status: INITIAL  4.  Pt will demonstrate a gait speed of >/=2.98 feet/sec using LRAD in order to decrease risk for falls. Baseline: 2.08 ft/sec w/ RW Goal status: INITIAL  5.  Pt will ambulate >/=500' over level indoor surfaces at modI level to improve endurance and safety with household mobility. Baseline: 125' w/ RW SBA Goal status: INITIAL  ASSESSMENT:  CLINICAL IMPRESSION: Pt's Berg score = 37/45, indicative of fall risk as score is < 45/56.  Pt did well with gait training with use of SPC with pt carrying SPC and did not use for assist with balance.  Pt did bettter with ambulation without use of SPC as pt able to focus more on gait without having to focus on placement/sequence of cane with gait. Foto was started by pt's husband but was not completed.  Cont with POC.   OBJECTIVE IMPAIRMENTS: Abnormal gait, decreased activity tolerance, decreased balance, decreased strength, increased muscle spasms, improper body mechanics, and postural dysfunction.   ACTIVITY LIMITATIONS: carrying, lifting, bending, squatting, stairs, transfers, bed mobility, and locomotion level  PARTICIPATION LIMITATIONS: meal prep, cleaning, laundry, medication management, driving,  shopping, and community activity  PERSONAL FACTORS: Age, Fitness, Past/current experiences, Transportation, and 1-2 comorbidities: recent left THA and HTN  are also affecting patient's functional outcome.   REHAB POTENTIAL: Good  CLINICAL DECISION MAKING: Stable/uncomplicated  EVALUATION COMPLEXITY: Low  PLAN:  PT FREQUENCY: 2x/week  PT DURATION: 8 weeks  PLANNED INTERVENTIONS: Therapeutic exercises, Therapeutic activity, Neuromuscular  re-education, Balance training, Gait training, Patient/Family education, Self Care, Joint mobilization, Stair training, Vestibular training, DME instructions, Manual therapy, and Re-evaluation  PLAN FOR NEXT SESSION:  review hip rehab HEP if son brings and add/modify as needed.  Discuss walking program in home (son makes pt count laps in home).  Right NMR, gait training (hoping to progress to cane), stair training   Rena Sweeden, Dorathy Stallone, PT 12/10/2022, 3:29 PM

## 2022-12-10 NOTE — Patient Instructions (Addendum)
  Marching In-Place - HOLD ONTO COUNTER FOR SUPPORT    Standing straight, alternate bringing knees toward trunk. Arms swing alternately. Do __1_ sets. Do _1__ times per day.  10 REPS EACH LEG     SINGLE LIMB STANCE - HOLD ONTO COUNTER FOR SUPPORT   Stance: single leg on floor. Raise leg. Hold _10__ seconds. Repeat with other leg. ___1-2 reps per set, __1_ sets per day, __5_ days per week  Copyright  VHI. All rights reserved.

## 2022-12-10 NOTE — Patient Instructions (Signed)
Reading: iPad has an accessibility feature which can read text on screen  Accessibility - spoke content  I can help set this up once you have an iPad

## 2022-12-12 ENCOUNTER — Ambulatory Visit: Payer: Medicare Other | Admitting: Physical Therapy

## 2022-12-12 ENCOUNTER — Ambulatory Visit: Payer: Medicare Other | Admitting: Speech Pathology

## 2022-12-12 ENCOUNTER — Encounter: Payer: Self-pay | Admitting: Physical Therapy

## 2022-12-12 DIAGNOSIS — R4701 Aphasia: Secondary | ICD-10-CM

## 2022-12-12 DIAGNOSIS — R2689 Other abnormalities of gait and mobility: Secondary | ICD-10-CM

## 2022-12-12 DIAGNOSIS — R2681 Unsteadiness on feet: Secondary | ICD-10-CM

## 2022-12-12 DIAGNOSIS — M6281 Muscle weakness (generalized): Secondary | ICD-10-CM

## 2022-12-12 NOTE — Therapy (Signed)
OUTPATIENT PHYSICAL THERAPY NEURO TREATMENT NOTE   Patient Name: Kelsey Jacobs MRN: [id number] DOB:1946/04/15, 77 y.o., female Today's Date: 12/12/2022   PCP: Dr. Michell Jacobs [facility] at [facility]) Kelsey Jacobs: Kelsey Schilling, MD  END OF SESSION:  PT End of Session - 12/12/22 1536     Visit Number 3    Number of Visits 17   16+eval   Date for PT Re-Evaluation 02/07/23   pushed out due to potential scheduling delay   Authorization Type MEDICARE PART A AND B    PT Start Time 1017    PT Stop Time 1100    PT Time Calculation (min) 43 min    Equipment Utilized During Treatment Gait belt    Activity Tolerance Patient tolerated treatment well    Behavior During Therapy WFL for tasks assessed/performed               Past Medical History:  Diagnosis Date   Medical history non-contributory    Past Surgical History:  Procedure Laterality Date   BACK SURGERY     FOOT SURGERY     INCISION AND DRAINAGE ABSCESS Left 03/29/2021   Procedure: INCISION AND DRAINAGE AXILLARY ABSCESS;  Surgeon: Kelsey Kussmaul, MD;  Location: [facility];  Service: General;  Laterality: Left;   Patient Active Problem List   Diagnosis Date Noted   Axillary abscess 03/30/2021    ONSET DATE: 11/28/2022  REFERRING DIAG: R26.9 (ICD-10-CM) - Gait abnormality  THERAPY DIAG:  Other abnormalities of gait and mobility  Unsteadiness on feet  Muscle weakness (generalized)  Rationale for Evaluation and Treatment: Rehabilitation  SUBJECTIVE:                                                                                                                                                                                             SUBJECTIVE STATEMENT:   Pt presents to PT accompanied by her husband - using RW; pt nods head yes when asked if she was tired after PT on Tuesday.  Husband states pt woke up at 10:30 and at 11:30 last night with c/o leg pain - says he gave her a Tylenol and she slept the  rest of the night.  Pt denies having any pain at start of session.  Says "yes" when asked if she is doing exercises at home.   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 No  PRECAUTIONS: Fall-anterior hip precautions have been lifted  WEIGHT BEARING RESTRICTIONS: No  FALLS: Has patient  fallen in last 6 months? No  LIVING ENVIRONMENT: Lives with: lives with their spouse and lives with their son-son will be leaving eventually, but is there indefinitely  Lives in: House/apartment Stairs: Yes: Internal: 15 steps; on left going up and External: 2 steps; uses handheld assist and door frame Has following equipment at home: Quad cane small base, Walker - 2 wheeled, bed side commode, Grab bars, and transport chair, chair alarm, bed alarm, call button for son, insertable bed rails  PLOF: Independent with transfers, Needs assistance with ADLs, Needs assistance with homemaking, Needs assistance with gait, and typically has supervision with transfers  PATIENT GOALS: PT asks pt if she would like to get stronger to which she nods.  Son would like for mom to get back to her normal routine safely.  And to be able to leave her alone and know she is safe and for her to transition to the cane just to be a bit more independent.  OBJECTIVE:   Janyth Contes was started but was not completed by patient's husband (12-10-22)  GAIT: Gait pattern: step through pattern, decreased stride length, decreased hip/knee flexion- Right, decreased hip/knee flexion- Left, trunk flexed, and narrow BOS Distance walked: 230' (2 consecutive laps); 30' x 2 reps to/from mat to/from Kelsey Jacobs device utilized:  No device  Level of assistance: CGA   NMR:   SLS activities for improved SLS on each leg;  alternating tap ups to 1st step (6" step) 10 reps each leg - with CGA without UE  support  Touching 3 balance bubbles 5 reps each leg with CGA to min assist - each one separately, then return to floor on 1st 4 reps; on 5th rep pt touched all 3 before returning foot to starting position (min assist for balance due to SLS deficits)  Rockerboard 10 reps - inside // bars - with bil. UE support with CGA   THEREX: Pt performed 5 times sit to stand transfer from high/low mat table without UE support Seated LE strenthening exercises; hip flexion with 2# weight on each leg 10 reps each;  LAQ's RLE and LLE 2# weight on each leg with 3 sec hold Standing hip flexion, extension, and abduction with 2# weight on each - bil. UE support on counter  SciFit - level 2.0 x 3" with bil. UE's and LE's for strengthening and endurance training  PATIENT EDUCATION: Education details: added SLS and marching to current HEP (for Lt THA) that she is consistently doing Person educated: Patient and Child(ren) Education method: Medical illustrator; handout from [employer] Education comprehension: verbalized understanding, returned demonstration, and needs further education  HOME EXERCISE PROGRAM: Added marching and SLS to HEP    Marching In-Place - HOLD ONTO COUNTER FOR SUPPORT    Standing straight, alternate bringing knees toward trunk. Arms swing alternately. Do __1_ sets. Do _1__ times per day.  10 REPS EACH LEG     SINGLE LIMB STANCE - HOLD ONTO COUNTER FOR SUPPORT   Stance: single leg on floor. Raise leg. Hold _10__ seconds. Repeat with other leg. ___1-2 reps per set, __1_ sets per day, __5_ days per week  Copyright  VHI. All rights reserved.   GOALS: Goals reviewed with patient? Yes  SHORT TERM GOALS: Target date: 01/03/2023  Pt will perform strength, stretching, and balance HEP with supervision from family to maintain progress. Baseline:  To be established. Goal status: INITIAL  2.  BERG to be assessed w/ STG/LTG set as appropriate.  Increase Berg score to >/  42/56 to  demo improvement in balance. Baseline: To be assessed. Goal status: INITIAL  3.  Pt will decrease 5xSTS to </=13 seconds w/ BUE support in order to demonstrate decreased risk for falls and improved functional bilateral LE strength and power. Baseline: 16.86 sec w/ BUE support Goal status: INITIAL  4.  Pt will demonstrate a gait speed of >/=2.5 feet/sec using LRAD in order to decrease risk for falls. Baseline: 2.08 ft/sec w/ RW Goal status: INITIAL  5.  Pt will negotiation 2 stairs w/o rail using LRAD at no more than supervision level in order to improve safe accessibility to home environment. Baseline: Pt needs minA at home to access home via garage or front door. Goal status: INITIAL  LONG TERM GOALS: Target date: 01/31/2023  FOTO to be assessed w/ LTG set. Baseline: To be assessed. Goal status: INITIAL  2.  BERG to be assessed w/ STG/LTG set as appropriate.  Increase Berg score to >/= 47/56 for reduced fall risk.  Baseline: 37/56 on 12-10-22 Goal status: INITIAL  3.  Pt will decrease 5xSTS to </=13 seconds w/o BUE support in order to demonstrate decreased risk for falls and improved functional bilateral LE strength and power. Baseline: 16.86 sec w/ BUE support Goal status: INITIAL  4.  Pt will demonstrate a gait speed of >/=2.98 feet/sec using LRAD in order to decrease risk for falls. Baseline: 2.08 ft/sec w/ RW Goal status: INITIAL  5.  Pt will ambulate >/=500' over level indoor surfaces at modI level to improve endurance and safety with household mobility. Baseline: 125' w/ RW SBA Goal status: INITIAL  ASSESSMENT:  CLINICAL IMPRESSION: PT session focused on gait training without device with CGA , balance activities to improve SLS on each leg, and strengthening exercises with use of 2# weight for bil. LE strengthening.  Pt expressed pain in LLE frequently during session with short seated rest breaks taken but pt able to continue with exercises and activities.  Pt did report  fatigue at end of session.  Pt is progressing well with ambulation without use of RW; pain in LLE appears to limit standing tolerance.  Cont with POC.   OBJECTIVE IMPAIRMENTS: Abnormal gait, decreased activity tolerance, decreased balance, decreased strength, increased muscle spasms, improper body mechanics, and postural dysfunction.   ACTIVITY LIMITATIONS: carrying, lifting, bending, squatting, stairs, transfers, bed mobility, and locomotion level  PARTICIPATION LIMITATIONS: meal prep, cleaning, laundry, medication management, driving, shopping, and community activity  PERSONAL FACTORS: Age, Fitness, Past/current experiences, Transportation, and 1-2 comorbidities: recent left THA and HTN  are also affecting patient's functional outcome.   REHAB POTENTIAL: Good  CLINICAL DECISION MAKING: Stable/uncomplicated  EVALUATION COMPLEXITY: Low  PLAN:  PT FREQUENCY: 2x/week  PT DURATION: 8 weeks  PLANNED INTERVENTIONS: Therapeutic exercises, Therapeutic activity, Neuromuscular re-education, Balance training, Gait training, Patient/Family education, Self Care, Joint mobilization, Stair training, Vestibular training, DME instructions, Manual therapy, and Re-evaluation  PLAN FOR NEXT SESSION:   Right NMR, LLE strengthening, balance training, gait training with no device (pt carried Egan and did not really use); stair training   Zoua Caporaso, [place], PT 12/12/2022, 3:39 PM

## 2022-12-12 NOTE — Therapy (Signed)
OUTPATIENT SPEECH LANGUAGE PATHOLOGY TREATMENT   Patient Name: Kelsey Jacobs MRN: [id number] DOB:06/25/1946, 77 y.o., female Today's Date: 12/12/2022  PCP: Alain Honey MD REFERRING PROVIDER: Basilia Jumbo, FNP  END OF SESSION:  End of Session - 12/12/22 1058     Visit Number 4    Number of Visits 25    Date for SLP Re-Evaluation 02/19/23    Authorization Type medicare    SLP Start Time 1058    SLP Stop Time  1143    SLP Time Calculation (min) 45 min    Activity Tolerance Patient tolerated treatment well              Past Medical History:  Diagnosis Date   Medical history non-contributory    Past Surgical History:  Procedure Laterality Date   BACK SURGERY     FOOT SURGERY     INCISION AND DRAINAGE ABSCESS Left 03/29/2021   Procedure: INCISION AND DRAINAGE AXILLARY ABSCESS;  Surgeon: Griselda Miner, MD;  Location: [facility];  Service: General;  Laterality: Left;   Patient Active Problem List   Diagnosis Date Noted   Axillary abscess 03/30/2021    ONSET DATE: 11/06/22   REFERRING DIAG: U88.280 (ICD-10-CM) - Arterial ischemic stroke, MCA (middle cerebral artery), left, acute (HCC) Z74.09 (ICD-10-CM) - Other reduced mobility Z78.9 (ICD-10-CM) - Other specified health status I69.320 (ICD-10-CM) - Aphasia following cerebral infarction  THERAPY DIAG:  Aphasia  Rationale for Evaluation and Treatment: Rehabilitation  SUBJECTIVE:   SUBJECTIVE STATEMENT: "The whiteboard has been great"  PAIN:  Are you having pain? No  OBJECTIVE:   TODAY'S TREATMENT:   12-12-22: Target receptive and expressive language in dynamic language task. SLP had pt sort cards into 2 distinct categories, pt with 90% accuracy given rare min-A. Given semantic cues, pt ID appropriate card in 80% of trials (from [facility]), with errors notable for there being a semantic or phonemic similarity in erroneously selection option. SLP requests pt attempt to name, with pt able to name 20% of targets.  Marked apraxia continues with usual groping, irregular and erroneous productions of targets despite max-A from SLP. Some benefit observed from reduced rate and visual cues include mouth placement demonstration, syllabification, and written representation of word. Advised pt's trial device would arrive in time for next session. Provided personal interest form for family to fill out.   12-10-22: Target expression of preferences through employment of low tech multimodal communication. Pt able to relay to SLP level of interest in various activities via 3 selection options (very, somewhat, none) for 24 topics, with pt's non verbal communication and care partners input matching her selection in 100% of trials. Determine dinner preference via binary options. Spouse endroses successful employment of this strategy at home, they find white board very helpful. Target verbal expression through trials of modified melodic intonation therapy, with pt able to approximate x6/7 fruits given usual modeling, visual and written cues. Visual cues to annotate number of syllables successful in avoiding additional sounds to end of words. Approximations notable for being effortful, inconsistent, and with groping.   12-05-22: Pt has been working on [employer]. Demonstrates ability to write family member names, demonstrates perseveration of erroneous letters in attempts to answer other, personally relevant questions which require one word response. SLP demonstrates supportive communication strategies of providing options and confirming choice. Son endorses improvement in choice making from [place], verbally presented, at home. In naming task, pt able to approximate intelligible production of 3/10 items. Approximations are laborious, with usual  paraphasias and occasional neologisms. Pt with usual awareness, evidences frustration at challenges. SLP introduces pt to [place] touch-talk as alternative communication option, as pt's natural speech is not  meeting her communication needs currently and SLP suspects extended duration of recovery possible d/t severity of deficits. Pt able to select dictated icon 4/4 trials. Pt able to answer personally relevant questions, with apparent accuracy which was confirmed by SLP use of confirmation communication technique and son's knowledge, in 5/5 trials. Education provided on recommendation for speech generating device and how said device can support pt's communication impairments and subsequent recovery. Pt and son verbalize understanding, are interested in a trial. SLP to order pt a trial device.   11/27/22: Reviewed aphasia education, demonstrated use of written choices, in field of 4, Kelsey Jacobs successfully made 2/2 personal preference choices. Initiated family training on using personally relevant words in theraputic activities at home. Personally relevant words list provided for family to fill out and return. Kelsey Jacobs is to work on written expression of family, friends an pets.    PATIENT EDUCATION: Education details: Sees today's treatment and patient instructions, aphasia ed Person educated: Patient, Spouse, and Child(ren) Education method: Explanation, Demonstration, Verbal cues, and Handouts Education comprehension: verbalized understanding, verbal cues required, and needs further education   GOALS: Goals reviewed with patient? Yes  SHORT TERM GOALS: Target date: 12/25/22  Pt will name 5 family members with occasional mod A Baseline: Goal status: IN PROGRESS  2.  Pt will answer personally relevant yes/no questions accurately 85% with multimodal communication Baseline:  Goal status: IN PROGRESS  3.  Pt will ID 5 food and shopping preferences using multimodal communication with occasional min A Baseline:  Goal status: IN PROGRESS  4.  Pt will name 6 items in personally relevant categories using AAC/multimodal communication as needed with occasional mod A Baseline:  Goal status: IN PROGRESS  5.   Family/caregivers will carryover 3 communication strategies to support pt's comprehension and expression Baseline:  Goal status: IN PROGRESS   LONG TERM GOALS: Target date: 02/18/22  Pt will initiate use mulitmodal communication to communicate wants/needs 3x over 1 week with usual min A from caregivers Baseline:  Goal status: IN PROGRESS  2.  Pt or caregivers will customize 4 items on AAC system with rare min A  Baseline:  Goal status: IN PROGRESS  3.  Pt will use multimodal communication to communicate 3 grocery items and 3 personal care items she needs with occasional min A from family Baseline:  Goal status: IN PROGRESS  4.  Pt will access online shopping site she uses and purchase a needed item with occasional  min A from family Baseline:  Goal status: IN PROGRESS  5.  Using compensatory strategies and multimodal communication, pt will take 3turns in conversation with occasional min A Baseline:  Goal status: IN PROGRESS  ASSESSMENT:  CLINICAL IMPRESSION: Patient is a 77 y.o. female who was seen today for aphasia. 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 She named 0/6 items, with 2 incidents of phonemic paraphasias, bite/book and beak/book. Other responses were facial expressions of frustration and non verbal. Spontaneous speech is limited to 1-2 words with groping and halting. She read 0/6 items on QAB, again with phonemic paraphasia of pity/pig. Kelsey Jacobs repeated 0/6 items on QAB. She is inconsistent with yes/no's, family is attempting to help  her use thumbs up and down for this. She is not using salient gestures consistently. Prior to CVA, Kelsey Jacobs shopped and looked up recipes on line using her computer. They do not have a tablet. She wrote her name, her son's and spouses name. She was not able to write the pets name or other  friends/family members. Communication is frustrating for her and her family. I recommend skilled ST to maximize communication for safety, QOL, independence and to reduce caregiver burden. She needs OT and PT as well, family stated they felt this would be too much at once  OBJECTIVE IMPAIRMENTS: include attention, memory, aphasia, and apraxia. These impairments are limiting patient from return to work, managing medications, managing appointments, managing finances, household responsibilities, ADLs/IADLs, and effectively communicating at home and in community. Factors affecting potential to achieve goals and functional outcome are severity of impairments. Patient will benefit from skilled SLP services to address above impairments and improve overall function.  REHAB POTENTIAL: Good  PLAN:  SLP FREQUENCY: 2x/week  SLP DURATION: 12 weeks  PLANNED INTERVENTIONS: Language facilitation, Environmental controls, Cueing hierachy, Cognitive reorganization, Internal/external aids, Functional tasks, Multimodal communication approach, SLP instruction and feedback, Compensatory strategies, and Patient/family education    Su Monks, CCC-SLP 12/12/2022, 10:59 AM

## 2022-12-16 NOTE — Therapy (Signed)
OUTPATIENT OCCUPATIONAL THERAPY NEURO EVALUATION  Patient Name: Kelsey Jacobs MRN: [id number] DOB:01/10/1946, 77 y.o., female Today's Date: 12/16/2022  PCP: Dr. Vanessa Barbara  REFERRING PROVIDER: Levada Schilling, MD  END OF SESSION:   Past Medical History:  Diagnosis Date   Medical history non-contributory    Past Surgical History:  Procedure Laterality Date   BACK SURGERY     FOOT SURGERY     INCISION AND DRAINAGE ABSCESS Left 03/29/2021   Procedure: INCISION AND DRAINAGE AXILLARY ABSCESS;  Surgeon: Jovita Kussmaul, MD;  Location: [facility];  Service: General;  Laterality: Left;   Patient Active Problem List   Diagnosis Date Noted   Axillary abscess 03/30/2021    ONSET DATE: 12/04/2022  REFERRING DIAG:  Diagnosis  I63.512 (ICD-10-CM) - Cerebral infarction due to unspecified occlusion or stenosis of left middle cerebral artery  Z74.09 (ICD-10-CM) - Other reduced mobility  Z78.9 (ICD-10-CM) - Other specified health status  I69.320 (ICD-10-CM) - Aphasia following cerebral infarction    THERAPY DIAG:  No diagnosis found.  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 {OPRCPAIN:27236}  FALLS: Has patient fallen in last 6 months? No  LIVING ENVIRONMENT: Lives with: lives with their spouse and lives with their son-son will be leaving eventually, but is there indefinitely  Lives in: House/apartment Stairs: Yes: Internal: 15 steps; on left going up and External: 2 steps; uses handheld assist and door frame Has following equipment at home: Quad cane small base, Walker - 2 wheeled, bed side commode, Grab bars, and transport chair, chair alarm, bed alarm,  call button for son, insertable bed rails   PLOF: Independent with transfers, Needs assistance with ADLs, Needs assistance with homemaking, Needs assistance with gait, and typically has supervision with transfers   PATIENT GOALS: PT asks pt if she would like to get stronger to which she nods.  Son would like for mom to get back to her normal routine safely.  And to be able to leave her alone and know she is safe and for her to transition to the cane just to be a bit more independent.    OBJECTIVE:   HAND DOMINANCE: {MISC; OT HAND DOMINANCE:[phone]}  ADLs: Overall ADLs: *** Transfers/ambulation related to ADLs: Eating: *** Grooming: *** UB Dressing: *** LB Dressing: *** Toileting: *** Bathing: *** Tub Shower transfers: *** Equipment: {equipment:25573}  IADLs: Shopping: *** Light housekeeping: *** Meal Prep: *** Community mobility: *** Medication management: *** Financial management: *** Handwriting: {OTWRITTENEXPRESSION:25361}  MOBILITY STATUS: {OTMOBILITY:25360}  POSTURE COMMENTS:  {posture:25561} Sitting balance: {sitting balance:25483}  ACTIVITY TOLERANCE: Activity tolerance: ***  FUNCTIONAL OUTCOME MEASURES: {OTFUNCTIONALMEASURES:27238}  UPPER EXTREMITY ROM:    {AROM/PROM:27142} ROM Right eval Left eval  Shoulder flexion    Shoulder abduction    Shoulder adduction    Shoulder extension    Shoulder internal rotation    Shoulder external rotation    Elbow flexion    Elbow extension    Wrist flexion    Wrist extension    Wrist ulnar deviation    Wrist radial deviation    Wrist pronation    Wrist supination    (Blank rows = not tested)  UPPER EXTREMITY MMT:     MMT Right eval Left eval  Shoulder flexion    Shoulder abduction    Shoulder adduction    Shoulder extension    Shoulder internal rotation    Shoulder external rotation    Middle trapezius    Lower trapezius    Elbow flexion    Elbow extension    Wrist flexion    Wrist  extension    Wrist ulnar deviation    Wrist radial deviation    Wrist pronation    Wrist supination    (Blank rows = not tested)  HAND FUNCTION: {handfunction:27230}  COORDINATION: {otcoordination:27237}  SENSATION: {sensation:27233}  EDEMA: ***  MUSCLE TONE: {UETONE:25567}  COGNITION: Overall cognitive status: {cognition:24006}  VISION: Subjective report: *** Baseline vision: {OTBASELINEVISION:25363} Visual history: {OTVISUALHISTORY:25364}  VISION ASSESSMENT: {visionassessment:27231}  Patient has difficulty with following activities due to following visual impairments: ***  PERCEPTION: {Perception:25564}  PRAXIS: {Praxis:25565}  OBSERVATIONS: ***   TODAY'S TREATMENT:                                                                                                                              DATE: ***   PATIENT EDUCATION: Education details: *** Person educated: {Person educated:25204} Education method: {Education Method:25205} Education comprehension: {Education Comprehension:25206}  HOME EXERCISE PROGRAM: ***   GOALS: Goals reviewed with patient? {yes/no:20286}  SHORT TERM GOALS: Target date: ***  *** Baseline: Goal status: {GOALSTATUS:25110}  2.  *** Baseline:  Goal status: {GOALSTATUS:25110}  3.  *** Baseline:  Goal status: {GOALSTATUS:25110}  4.  *** Baseline:  Goal status: {GOALSTATUS:25110}  5.  *** Baseline:  Goal status: {GOALSTATUS:25110}  6.  *** Baseline:  Goal status: {GOALSTATUS:25110}  LONG TERM GOALS: Target date: ***  *** Baseline:  Goal status: {GOALSTATUS:25110}  2.  *** Baseline:  Goal status: {GOALSTATUS:25110}  3.  *** Baseline:  Goal status: {GOALSTATUS:25110}  4.  *** Baseline:  Goal status: {GOALSTATUS:25110}  5.  *** Baseline:  Goal status: {GOALSTATUS:25110}  6.  *** Baseline:  Goal status: {GOALSTATUS:25110}  ASSESSMENT:  CLINICAL IMPRESSION: Patient is a *** y.o. *** who was seen  today for occupational therapy evaluation for ***.   PERFORMANCE DEFICITS: in functional skills including {OT physical skills:25468}, cognitive skills including {OT cognitive skills:25469}, and psychosocial skills including {OT psychosocial skills:25470}.   IMPAIRMENTS: are limiting patient from {OT performance deficits:25471}.   CO-MORBIDITIES: {Comorbidities:25485} that affects occupational performance. Patient will benefit from skilled OT to address above impairments and improve overall function.  MODIFICATION OR ASSISTANCE TO COMPLETE EVALUATION: {OT modification:25474}  OT OCCUPATIONAL PROFILE AND HISTORY: {OT PROFILE AND HISTORY:25484}  CLINICAL DECISION MAKING: {OT CDM:25475}  REHAB POTENTIAL: {rehabpotential:25112}  EVALUATION COMPLEXITY: {Evaluation complexity:25115}    PLAN:  OT FREQUENCY: {rehab frequency:25116}  OT DURATION: {rehab duration:25117}  PLANNED INTERVENTIONS: {OT Interventions:25467}  RECOMMENDED OTHER SERVICES: ***  CONSULTED AND AGREED WITH PLAN OF CARE: {TIW:58099}  PLAN FOR NEXT SESSION: ***   Sheran Lawless, OT 12/16/2022, 12:53 PM

## 2022-12-18 ENCOUNTER — Ambulatory Visit: Payer: Medicare Other | Admitting: Speech Pathology

## 2022-12-18 ENCOUNTER — Ambulatory Visit: Payer: Medicare Other | Admitting: Occupational Therapy

## 2022-12-18 ENCOUNTER — Encounter: Payer: Self-pay | Admitting: Occupational Therapy

## 2022-12-18 ENCOUNTER — Encounter: Payer: Self-pay | Admitting: Physical Therapy

## 2022-12-18 ENCOUNTER — Ambulatory Visit: Payer: Medicare Other | Admitting: Physical Therapy

## 2022-12-18 DIAGNOSIS — I69351 Hemiplegia and hemiparesis following cerebral infarction affecting right dominant side: Secondary | ICD-10-CM

## 2022-12-18 DIAGNOSIS — R4701 Aphasia: Secondary | ICD-10-CM

## 2022-12-18 DIAGNOSIS — R41842 Visuospatial deficit: Secondary | ICD-10-CM

## 2022-12-18 DIAGNOSIS — R41840 Attention and concentration deficit: Secondary | ICD-10-CM

## 2022-12-18 DIAGNOSIS — M6281 Muscle weakness (generalized): Secondary | ICD-10-CM

## 2022-12-18 DIAGNOSIS — R2689 Other abnormalities of gait and mobility: Secondary | ICD-10-CM

## 2022-12-18 DIAGNOSIS — R2681 Unsteadiness on feet: Secondary | ICD-10-CM

## 2022-12-18 NOTE — Therapy (Signed)
OUTPATIENT SPEECH LANGUAGE PATHOLOGY TREATMENT   Patient Name: Kelsey Jacobs MRN: [id number] DOB:1946/04/24, 77 y.o., female 47 Date: 12/18/2022  PCP: Larene Beach MD REFERRING PROVIDER: Gwendolyn Lima, FNP  END OF SESSION:  End of Session - 12/18/22 1359     Visit Number 5    Number of Visits 25    Date for SLP Re-Evaluation 02/19/23    Authorization Type medicare    SLP Start Time 1530    SLP Stop Time  1615    SLP Time Calculation (min) 45 min    Activity Tolerance Patient tolerated treatment well              Past Medical History:  Diagnosis Date   Medical history non-contributory    Past Surgical History:  Procedure Laterality Date   BACK SURGERY     FOOT SURGERY     INCISION AND DRAINAGE ABSCESS Left 03/29/2021   Procedure: INCISION AND DRAINAGE AXILLARY ABSCESS;  Surgeon: Jovita Kussmaul, MD;  Location: [facility];  Service: General;  Laterality: Left;   Patient Active Problem List   Diagnosis Date Noted   Axillary abscess 03/30/2021    ONSET DATE: 11/06/22   REFERRING DIAG: U54.270 (ICD-10-CM) - Arterial ischemic stroke, MCA (middle cerebral artery), left, acute ([place]) Z74.09 (ICD-10-CM) - Other reduced mobility Z78.9 (ICD-10-CM) - Other specified health status I69.320 (ICD-10-CM) - Aphasia following cerebral infarction  THERAPY DIAG:  Aphasia  Rationale for Evaluation and Treatment: Rehabilitation  SUBJECTIVE:   SUBJECTIVE STATEMENT: "Okay"  PAIN:  Are you having pain? No  OBJECTIVE:   TODAY'S TREATMENT:   12-18-22: SLP provided pt with their trial Lingraphica device and A in setting per pt's preferences. Provided education to son on how to modify device and how family can employ to A in communication and therapy practice. With verbal cues, pt able to demonstrate navigation of device through various folders and select dictated icon with 100% accuracy. Pt's son endorses confidence in ability to modify device, will begin with device  customization per pt preferences prior to next session.   12-12-22: Target receptive and expressive language in dynamic language task. SLP had pt sort cards into 2 distinct categories, pt with 90% accuracy given rare min-A. Given semantic cues, pt ID appropriate card in 80% of trials (from [facility]), with errors notable for there being a semantic or phonemic similarity in erroneously selection option. SLP requests pt attempt to name, with pt able to name 20% of targets. Marked apraxia continues with usual groping, irregular and erroneous productions of targets despite max-A from SLP. Some benefit observed from reduced rate and visual cues include mouth placement demonstration, syllabification, and written representation of word. Advised pt's trial device would arrive in time for next session. Provided personal interest form for family to fill out.   12-10-22: Target expression of preferences through employment of low tech multimodal communication. Pt able to relay to SLP level of interest in various activities via 3 selection options (very, somewhat, none) for 24 topics, with pt's non verbal communication and care partners input matching her selection in 100% of trials. Determine dinner preference via binary options. Spouse endroses successful employment of this strategy at home, they find white board very helpful. Target verbal expression through trials of modified melodic intonation therapy, with pt able to approximate x6/7 fruits given usual modeling, visual and written cues. Visual cues to annotate number of syllables successful in avoiding additional sounds to end of words. Approximations notable for being effortful, inconsistent, and  with groping.   12-05-22: Pt has been working on [employer]. Demonstrates ability to write family member names, demonstrates perseveration of erroneous letters in attempts to answer other, personally relevant questions which require one word response. SLP demonstrates supportive  communication strategies of providing options and confirming choice. Son endorses improvement in choice making from [place], verbally presented, at home. In naming task, pt able to approximate intelligible production of 3/10 items. Approximations are laborious, with usual paraphasias and occasional neologisms. Pt with usual awareness, evidences frustration at challenges. SLP introduces pt to [employer] touch-talk as alternative communication option, as pt's natural speech is not meeting her communication needs currently and SLP suspects extended duration of recovery possible d/t severity of deficits. Pt able to select dictated icon 4/4 trials. Pt able to answer personally relevant questions, with apparent accuracy which was confirmed by SLP use of confirmation communication technique and son's knowledge, in 5/5 trials. Education provided on recommendation for speech generating device and how said device can support pt's communication impairments and subsequent recovery. Pt and son verbalize understanding, are interested in a trial. SLP to order pt a trial device.   11/27/22: Reviewed aphasia education, demonstrated use of written choices, in field of 4, Naliya successfully made 2/2 personal preference choices. Initiated family training on using personally relevant words in theraputic activities at home. Personally relevant words list provided for family to fill out and return. Makai is to work on written expression of family, friends an pets.    PATIENT EDUCATION: Education details: Sees today's treatment and patient instructions, aphasia ed Person educated: Patient, Spouse, and Child(ren) Education method: Explanation, Demonstration, Verbal cues, and Handouts Education comprehension: verbalized understanding, verbal cues required, and needs further education   GOALS: Goals reviewed with patient? Yes  SHORT TERM GOALS: Target date: 12/25/22  Pt will name 5 family members with occasional mod  A Baseline: Goal status: IN PROGRESS  2.  Pt will answer personally relevant yes/no questions accurately 85% with multimodal communication Baseline:  Goal status: IN PROGRESS  3.  Pt will ID 5 food and shopping preferences using multimodal communication with occasional min A Baseline:  Goal status: IN PROGRESS  4.  Pt will name 6 items in personally relevant categories using AAC/multimodal communication as needed with occasional mod A Baseline:  Goal status: IN PROGRESS  5.  Family/caregivers will carryover 3 communication strategies to support pt's comprehension and expression Baseline:  Goal status: IN PROGRESS   LONG TERM GOALS: Target date: 02/18/22  Pt will initiate use mulitmodal communication to communicate wants/needs 3x over 1 week with usual min A from caregivers Baseline:  Goal status: IN PROGRESS  2.  Pt or caregivers will customize 4 items on AAC system with rare min A  Baseline:  Goal status: IN PROGRESS  3.  Pt will use multimodal communication to communicate 3 grocery items and 3 personal care items she needs with occasional min A from family Baseline:  Goal status: IN PROGRESS  4.  Pt will access online shopping site she uses and purchase a needed item with occasional  min A from family Baseline:  Goal status: IN PROGRESS  5.  Using compensatory strategies and multimodal communication, pt will take 3turns in conversation with occasional min A Baseline:  Goal status: IN PROGRESS  ASSESSMENT:  CLINICAL IMPRESSION: Patient is a 77 y.o. female who was seen today for aphasia. 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 She named 0/6 items, with 2 incidents of phonemic paraphasias, bite/book and beak/book. Other responses were facial expressions of frustration and non verbal. Spontaneous speech is limited to  1-2 words with groping and halting. She read 0/6 items on QAB, again with phonemic paraphasia of pity/pig. Stephanye repeated 0/6 items on QAB. She is inconsistent with yes/no's, family is attempting to help her use thumbs up and down for this. She is not using salient gestures consistently. Prior to CVA, Cherina shopped and looked up recipes on line using her computer. They do not have a tablet. She wrote her name, her son's and spouses name. She was not able to write the pets name or other friends/family members. Communication is frustrating for her and her family. I recommend skilled ST to maximize communication for safety, QOL, independence and to reduce caregiver burden. She needs OT and PT as well, family stated they felt this would be too much at once  OBJECTIVE IMPAIRMENTS: include attention, memory, aphasia, and apraxia. These impairments are limiting patient from return to work, managing medications, managing appointments, managing finances, household responsibilities, ADLs/IADLs, and effectively communicating at home and in community. Factors affecting potential to achieve goals and functional outcome are severity of impairments. Patient will benefit from skilled SLP services to address above impairments and improve overall function.  REHAB POTENTIAL: Good  PLAN:  SLP FREQUENCY: 2x/week  SLP DURATION: 12 weeks  PLANNED INTERVENTIONS: Language facilitation, Environmental controls, Cueing hierachy, Cognitive reorganization, Internal/external aids, Functional tasks, Multimodal communication approach, SLP instruction and feedback, Compensatory strategies, and Patient/family education    Maia Breslow, CCC-SLP 12/18/2022, 4:17 PM

## 2022-12-18 NOTE — Therapy (Unsigned)
OUTPATIENT PHYSICAL THERAPY NEURO TREATMENT NOTE   Patient Name: Kelsey Jacobs MRN: [id number] DOB:1946/09/27, 77 y.o., female Today's Date: 12/19/2022   PCP: Dr. Michell Heinrich [facility]) [place]: Levada Schilling, MD  END OF SESSION:  PT End of Session - 12/18/22 1323     Visit Number 4    Number of Visits 17   16+eval   Date for PT Re-Evaluation 02/07/23   pushed out due to potential scheduling delay   Authorization Type MEDICARE PART A AND B    PT Start Time 1319    PT Stop Time 1358    PT Time Calculation (min) 39 min    Equipment Utilized During Treatment Gait belt    Activity Tolerance Patient tolerated treatment well    Behavior During Therapy WFL for tasks assessed/performed               Past Medical History:  Diagnosis Date   Medical history non-contributory    Past Surgical History:  Procedure Laterality Date   BACK SURGERY     FOOT SURGERY     INCISION AND DRAINAGE ABSCESS Left 03/29/2021   Procedure: INCISION AND DRAINAGE AXILLARY ABSCESS;  Surgeon: Jovita Kussmaul, MD;  Location: [facility];  Service: General;  Laterality: Left;   Patient Active Problem List   Diagnosis Date Noted   Axillary abscess 03/30/2021    ONSET DATE: 11/28/2022  REFERRING DIAG: R26.9 (ICD-10-CM) - Gait abnormality  THERAPY DIAG:  Other abnormalities of gait and mobility  Unsteadiness on feet  Muscle weakness (generalized)  Hemiplegia and hemiparesis following cerebral infarction affecting right dominant side ([place])  Rationale for Evaluation and Treatment: Rehabilitation  SUBJECTIVE:                                                                                                                                                                                             SUBJECTIVE STATEMENT:   Pt presents to PT accompanied by her husband - using RW.  Husband states pt is having leg pain today, but that she has not needed Tylenol today.     Says "yes" when asked if the HEP remains okay.  Pt denies any recent falls.   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 No  PRECAUTIONS: Fall-anterior hip precautions have been lifted  WEIGHT BEARING RESTRICTIONS: No  FALLS: Has patient fallen in last 6 months? No  LIVING ENVIRONMENT: Lives with: lives with their spouse and lives with their son-son will  be leaving eventually, but is there indefinitely  Lives in: House/apartment Stairs: Yes: Internal: 15 steps; on left going up and External: 2 steps; uses handheld assist and door frame Has following equipment at home: Quad cane small base, Walker - 2 wheeled, bed side commode, Grab bars, and transport chair, chair alarm, bed alarm, call button for son, insertable bed rails  PLOF: Independent with transfers, Needs assistance with ADLs, Needs assistance with homemaking, Needs assistance with gait, and typically has supervision with transfers  PATIENT GOALS: PT asks pt if she would like to get stronger to which she nods.  Son would like for mom to get back to her normal routine safely.  And to be able to leave her alone and know she is safe and for her to transition to the cane just to be a bit more independent.  OBJECTIVE:   GAIT: Gait pattern: step through pattern, decreased stride length, decreased hip/knee flexion- Right, decreased hip/knee flexion- Left, trunk flexed, and narrow BOS Distance walked: 41' (consecutively) +337' (consecutively no AD) Assistive device utilized:  RW + none Level of assistance:  ModI, SBA-CGA (w/o device) Cued for paced breathing during task to improve endurance.  NMR:   -mini squats at counter w/ return demo and tactile cue of chair on back of knees to promote posterior weight shift, pt indicates w/ head shake and tapping LE that this bothers her leg so  activity d/c'd -Practiced lateral weight shift in open annex of gym using sticky note targets on wall and PT facilitating task in part-whole manner progressing to 2-part task of step and reach to lateral targets -Corner balance: feet apart eyes closed 2x45 seconds > eyes closed head turns 2x45 seconds -Forward and lateral step over 4" hurdles 4x8' each direction -Forward 6" step ups x10 each LE using BUE support  PATIENT EDUCATION: Education details: Continue HEP. Person educated: Patient and Spouse Education method: Customer service manager Education comprehension: verbalized understanding, returned demonstration, and needs further education  HOME EXERCISE PROGRAM: Added marching and SLS to HEP    Marching In-Place - HOLD ONTO COUNTER FOR SUPPORT    Standing straight, alternate bringing knees toward trunk. Arms swing alternately. Do __1_ sets. Do _1__ times per day.  10 REPS EACH LEG     SINGLE LIMB STANCE - HOLD ONTO COUNTER FOR SUPPORT   Stance: single leg on floor. Raise leg. Hold _10__ seconds. Repeat with other leg. ___1-2 reps per set, __1_ sets per day, __5_ days per week  Copyright  VHI. All rights reserved.   GOALS: Goals reviewed with patient? Yes  SHORT TERM GOALS: Target date: 01/03/2023  Pt will perform strength, stretching, and balance HEP with supervision from family to maintain progress. Baseline:  To be established. Goal status: INITIAL  2.  BERG to be assessed w/ STG/LTG set as appropriate.  Increase Berg score to >/ 42/56 to demo improvement in balance. Baseline: To be assessed. Goal status: INITIAL  3.  Pt will decrease 5xSTS to </=13 seconds w/ BUE support in order to demonstrate decreased risk for falls and improved functional bilateral LE strength and power. Baseline: 16.86 sec w/ BUE support Goal status: INITIAL  4.  Pt will demonstrate a gait speed of >/=2.5 feet/sec using LRAD in order to decrease risk for falls. Baseline: 2.08  ft/sec w/ RW Goal status: INITIAL  5.  Pt will negotiation 2 stairs w/o rail using LRAD at no more than supervision level in order to improve safe accessibility to home environment.  Baseline: Pt needs minA at home to access home via garage or front door. Goal status: INITIAL  LONG TERM GOALS: Target date: 01/31/2023  FOTO to be assessed w/ LTG set. Baseline: To be assessed. Goal status: INITIAL  2.  BERG to be assessed w/ STG/LTG set as appropriate.  Increase Berg score to >/= 47/56 for reduced fall risk.  Baseline: 37/56 on 12-10-22 Goal status: INITIAL  3.  Pt will decrease 5xSTS to </=13 seconds w/o BUE support in order to demonstrate decreased risk for falls and improved functional bilateral LE strength and power. Baseline: 16.86 sec w/ BUE support Goal status: INITIAL  4.  Pt will demonstrate a gait speed of >/=2.98 feet/sec using LRAD in order to decrease risk for falls. Baseline: 2.08 ft/sec w/ RW Goal status: INITIAL  5.  Pt will ambulate >/=500' over level indoor surfaces at modI level to improve endurance and safety with household mobility. Baseline: 125' w/ RW SBA Goal status: INITIAL  ASSESSMENT:  CLINICAL IMPRESSION: Emphasis of skilled session today on continued gait training with and without AD.  Pt demonstrates signs of fatigue with ambulation, more so without an AD, but dynamically presents with improving stability.  Challenged patient with multi-part task requiring part-whole practice, but pt able to return demonstration with several reps.  She would do well to continue addressing static or dynamic balance over compliant or unlevel surfaces as well as in visually limited conditions such as having eyes closed.  Overall, she is progressing towards goals with ongoing skilled PT POC.  OBJECTIVE IMPAIRMENTS: Abnormal gait, decreased activity tolerance, decreased balance, decreased strength, increased muscle spasms, improper body mechanics, and postural dysfunction.    ACTIVITY LIMITATIONS: carrying, lifting, bending, squatting, stairs, transfers, bed mobility, and locomotion level  PARTICIPATION LIMITATIONS: meal prep, cleaning, laundry, medication management, driving, shopping, and community activity  PERSONAL FACTORS: Age, Fitness, Past/current experiences, Transportation, and 1-2 comorbidities: recent left THA and HTN  are also affecting patient's functional outcome.   REHAB POTENTIAL: Good  CLINICAL DECISION MAKING: Stable/uncomplicated  EVALUATION COMPLEXITY: Low  PLAN:  PT FREQUENCY: 2x/week  PT DURATION: 8 weeks  PLANNED INTERVENTIONS: Therapeutic exercises, Therapeutic activity, Neuromuscular re-education, Balance training, Gait training, Patient/Family education, Self Care, Joint mobilization, Stair training, Vestibular training, DME instructions, Manual therapy, and Re-evaluation  PLAN FOR NEXT SESSION:   Right NMR, LLE strengthening, balance training-maybe manual dual task, compliant/unlevel surfaces, dynamic tasks, gait training with no device vs RW; stair training   Sadie Haber, PT, DPT 12/19/2022, 9:31 AM

## 2022-12-19 NOTE — Therapy (Signed)
OUTPATIENT OCCUPATIONAL THERAPY NEURO TREATMENT  Patient Name: Kelsey Jacobs MRN: [id number] DOB:[birth year], 77 y.o., female Today's Date: 12/20/2022  PCP: Dr. Vanessa Barbara  REFERRING PROVIDER: Levada Schilling, MD  END OF SESSION:  OT End of Session - 12/20/22 0936     Visit Number 2    Number of Visits 17    Date for OT Re-Evaluation 02/16/23    Authorization Type MCR, UHC    Progress Note Due on Visit 10    OT Start Time 0932    OT Stop Time 1013    OT Time Calculation (min) 41 min    Activity Tolerance Patient tolerated treatment well    Behavior During Therapy WFL for tasks assessed/performed              Past Medical History:  Diagnosis Date   Medical history non-contributory    Past Surgical History:  Procedure Laterality Date   BACK SURGERY     FOOT SURGERY     INCISION AND DRAINAGE ABSCESS Left 03/29/2021   Procedure: INCISION AND DRAINAGE AXILLARY ABSCESS;  Surgeon: Autumn Messing III, MD;  Location: [facility];  Service: General;  Laterality: Left;   Patient Active Problem List   Diagnosis Date Noted   Axillary abscess 03/30/2021    ONSET DATE: 12/04/2022 referral date  REFERRING DIAG:  Diagnosis  I63.512 (ICD-10-CM) - Cerebral infarction due to unspecified occlusion or stenosis of left middle cerebral artery  Z74.09 (ICD-10-CM) - Other reduced mobility  Z78.9 (ICD-10-CM) - Other specified health status  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  5/10 LT hip/femur - O.T. not addressing  FALLS: Has patient fallen in last 6 months? No  LIVING ENVIRONMENT: Lives with: lives with their spouse and lives with their son-son will be leaving eventually, but is there indefinitely  Lives in: House/apartment (pt lives on first floor)  Stairs: Yes: Internal: 15 steps; on left going up and External: 2 steps; uses handheld assist and door frame Has following equipment at home: Quad cane small base, Walker - 2 wheeled, bed side commode, Grab bars, and transport chair, chair alarm, bed alarm, call button for son, insertable bed rails   PLOF: Independent with transfers, Needs assistance with ADLs, Needs assistance with homemaking, Needs assistance with gait, and typically has supervision with transfers   PATIENT GOALS: PT asks pt if she would like to get stronger to which she nods.  Son would like for mom to get back to her normal routine safely.  And to be able to leave her alone and know she is safe and for her to transition to the cane just to be a bit more independent.    OBJECTIVE: (from evaluation unless otherwise noted)  HAND DOMINANCE: Right  ADLs: Overall ADLs: mostly supervision/cueing, min assist at times Transfers/ambulation related to ADLs: Eating: mod I  Grooming: mod I  UB Dressing: mod  I  LB Dressing: min assist to don Lt foot in pants and occasionally Lt sock, slip on shoes Toileting: mod I (occasional assist for perineal care w/ BM)  Bathing: set up, cues for thoroughness Tub Shower transfers: supervision/cues Equipment:  BSC for shower seat, grab bars  IADLs: Shopping: husband has done for several years Light housekeeping: dependent, has cleaning lady for heavier cleaning 1x/month Meal Prep: dependent at this time Community  mobility: not currently driving - relying on family Medication management: husband administering Financial management: son has been doing Handwriting: 100% legible and print, name only  MOBILITY STATUS:  uses walker   UPPER EXTREMITY ROM:  BUE AROM WNLs   UPPER EXTREMITY MMT:   RUE grossly 4/5, LUE MMT grossly 5/5   HAND FUNCTION: Grip strength: Right: 38.1 lbs; Left: 33.0 lbs  COORDINATION: 9 Hole Peg test: Right: 28 sec; Left: 26 sec  SENSATION: Slightly dulled RUE, pt appears to detect stimuli and localization  EDEMA: none   COGNITION: Overall cognitive status: Difficulty to assess due to: Communication impairment (aphasia). Pt can follow 1 step visual cues/demo  VISION: Subjective report: bluriness since stroke Baseline vision: Wears glasses all the time Visual history: corrective eye surgery  VISION ASSESSMENT: Not tested D/t aphasia   PERCEPTION: Not tested  PRAXIS: Not tested  OBSERVATIONS: Pt mostly limited by Mervin Hack, overall needs sup/min assist for BADLS   TODAY'S TREATMENT:                                                                                                                              - Therapeutic exercises completed for duration as noted below including:  OT initiated dowel AAROM HEP including shoulder flex, ER, shoulder horizontal abduction and adduction, and chest press for improved ROM of affected extremity. Pt able to return demonstration of each exercise x10 each. Handout provided.  - Self-care/home management completed for duration as noted below including: Pt completed reaching to L foot with use of footrest requiring moderate multimodal cues for completion as instructed. OT educated pt and son on how to encourage more accurate LLE management with LB dressing with modifications.  - Therapeutic activities completed for duration as noted below including: Pt participated in game of war with therapist and son identifying which player  had the highest card and attempting to name the cards with play for increased challenge to RUE ROM, coordination, scanning, and communication.  PATIENT EDUCATION: Education details: dowel HEP; LB dressing Person educated: Patient and Child(ren) Education method: Explanation, Demonstration, Tactile cues, Verbal cues, and Handouts Education comprehension: verbalized understanding, returned demonstration, verbal cues required, tactile cues required, and needs further education  HOME EXERCISE PROGRAM: 1/12 - dowel HEP   GOALS: Goals reviewed with patient? Yes  SHORT TERM GOALS: Target date: 01/18/23  Independent with HEP for RUE strengthening Baseline: Goal status: INITIAL  2.  Pt to perform LE dressing (donning pants and Lt sock) w/ AE and no more than min  cues Baseline:  Goal status: INITIAL  3.  Pt to prepare simple snack, sandwich, or microwaveable items with min cues and direct sup Baseline:  Goal status: INITIAL  4.  Pt to perform bathing and perineal care with no more than min cues/sup Baseline:  Goal status: INITIAL   LONG TERM GOALS: Target date: 02/16/23  Independent with all BADLS Baseline:  Goal status: INITIAL  2.  Pt to perform familiar stovetop meal with direct supervision and min cues prn Baseline:  Goal status: INITIAL  3.  Pt to consistently perform light IADLS including washing dishes and laundry Baseline:  Goal status: INITIAL   ASSESSMENT:  CLINICAL IMPRESSION: Pt demonstrates impaired motor planning with completion of LB dressing, which is further impacted by LLE pain and aphasia. Pt would not be a good candidate for simulated completion of LB dressing with contextually different item for simulation (I.e. theraband) or new item such as reacher. Pt demonstrates sufficient reach and ROM of BUE and LLE as needed for safe completion; however will require additional practice of modified completion following LTHA.   PERFORMANCE DEFICITS: in functional  skills including ADLs, IADLs, coordination, dexterity, sensation, strength, mobility, body mechanics, decreased knowledge of use of DME, and UE functional use, cognitive skills including attention and safety awareness, and psychosocial skills including coping strategies.   IMPAIRMENTS: are limiting patient from ADLs, IADLs, leisure, and social participation.   CO-MORBIDITIES: may have co-morbidities  that affects occupational performance. Patient will benefit from skilled OT to address above impairments and improve overall function.  REHAB POTENTIAL: Good  PLAN:  OT FREQUENCY: 2x/week  OT DURATION: 8 weeks, plus evaluation (may only need 6 weeks)   PLANNED INTERVENTIONS: self care/ADL training, therapeutic exercise, therapeutic activity, neuromuscular re-education, balance training, functional mobility training, moist heat, patient/family education, cognitive remediation/compensation, visual/perceptual remediation/compensation, coping strategies training, and DME and/or AE instructions  RECOMMENDED OTHER SERVICES: none at this time  CONSULTED AND AGREED WITH PLAN OF CARE: Patient and family member/caregiver  PLAN FOR NEXT SESSION: review/practice LE dressing including Lt sock, review dowel HEP and progress to strengthening as tolerated; env scanning   Delana Meyer, OT 12/20/2022, 3:09 PM

## 2022-12-20 ENCOUNTER — Ambulatory Visit: Payer: Medicare Other | Admitting: Occupational Therapy

## 2022-12-20 ENCOUNTER — Encounter: Payer: Self-pay | Admitting: Physical Therapy

## 2022-12-20 ENCOUNTER — Ambulatory Visit: Payer: Medicare Other | Admitting: Speech Pathology

## 2022-12-20 ENCOUNTER — Encounter: Payer: Self-pay | Admitting: Occupational Therapy

## 2022-12-20 ENCOUNTER — Ambulatory Visit: Payer: Medicare Other | Admitting: Physical Therapy

## 2022-12-20 DIAGNOSIS — R41842 Visuospatial deficit: Secondary | ICD-10-CM

## 2022-12-20 DIAGNOSIS — R2681 Unsteadiness on feet: Secondary | ICD-10-CM

## 2022-12-20 DIAGNOSIS — I69351 Hemiplegia and hemiparesis following cerebral infarction affecting right dominant side: Secondary | ICD-10-CM

## 2022-12-20 DIAGNOSIS — R4701 Aphasia: Secondary | ICD-10-CM

## 2022-12-20 DIAGNOSIS — M6281 Muscle weakness (generalized): Secondary | ICD-10-CM

## 2022-12-20 DIAGNOSIS — R2689 Other abnormalities of gait and mobility: Secondary | ICD-10-CM

## 2022-12-20 NOTE — Patient Instructions (Signed)
Access Code: [email or web address] URL: [email or web address] Date: 12/20/2022 Prepared by: Elease Etienne  Exercises - Side Stepping with Resistance at Thighs and Counter Support  - 1 x daily - 5 x weekly - 3 sets - 10 reps - Sit to Stand with Resistance Around Legs  - 1 x daily - 5 x weekly - 2 sets - 10 reps

## 2022-12-20 NOTE — Therapy (Signed)
OUTPATIENT PHYSICAL THERAPY NEURO TREATMENT NOTE   Patient Name: Kelsey Jacobs MRN: [id number] DOB:[birth year], 77 y.o., female Today's Date: 12/20/2022   PCP: [facility]. [facility] [place]: Levada Schilling, MD  END OF SESSION:  PT End of Session - 12/20/22 1105     Visit Number 5    Number of Visits 17   16+eval   Date for PT Re-Evaluation 02/07/23   pushed out due to potential scheduling delay   Authorization Type MEDICARE PART A AND B    PT Start Time 1103    PT Stop Time 1140    PT Time Calculation (min) 37 min    Equipment Utilized During Treatment Gait belt    Activity Tolerance Patient tolerated treatment well    Behavior During Therapy WFL for tasks assessed/performed               Past Medical History:  Diagnosis Date   Medical history non-contributory    Past Surgical History:  Procedure Laterality Date   BACK SURGERY     FOOT SURGERY     INCISION AND DRAINAGE ABSCESS Left 03/29/2021   Procedure: INCISION AND DRAINAGE AXILLARY ABSCESS;  Surgeon: Jovita Kussmaul, MD;  Location: [facility];  Service: General;  Laterality: Left;   Patient Active Problem List   Diagnosis Date Noted   Axillary abscess 03/30/2021    ONSET DATE: 11/28/2022  REFERRING DIAG: R26.9 (ICD-10-CM) - Gait abnormality  THERAPY DIAG:  Muscle weakness (generalized)  Hemiplegia and hemiparesis following cerebral infarction affecting right dominant side ([place])  Other abnormalities of gait and mobility  Unsteadiness on feet  Rationale for Evaluation and Treatment: Rehabilitation  SUBJECTIVE:                                                                                                                                                                                             SUBJECTIVE STATEMENT:   Son provides update on pt fatigue w/ 3 disciplines per day and pt pain related to completing 3 a day.  Does not feel she needs to drop to less per  visit, but would like to better pace activity to prevent her having bad days at home following therapies as able. Says "yes" when asked if the HEP remains okay.  Pt denies any recent falls.  Son leaves for work following subjective.  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 Yes: NPRS scale: 3/10 Pain location: left  hip Pain description: sore Aggravating factors: too much activity Relieving factors: rest  PRECAUTIONS: Fall-anterior hip precautions have been lifted  WEIGHT BEARING RESTRICTIONS: No  FALLS: Has patient fallen in last 6 months? No  LIVING ENVIRONMENT: Lives with: lives with their spouse and lives with their son-son will be leaving eventually, but is there indefinitely  Lives in: House/apartment Stairs: Yes: Internal: 15 steps; on left going up and External: 2 steps; uses handheld assist and door frame Has following equipment at home: Quad cane small base, Walker - 2 wheeled, bed side commode, Grab bars, and transport chair, chair alarm, bed alarm, call button for son, insertable bed rails  PLOF: Independent with transfers, Needs assistance with ADLs, Needs assistance with homemaking, Needs assistance with gait, and typically has supervision with transfers  PATIENT GOALS: PT asks pt if she would like to get stronger to which she nods.  Son would like for mom to get back to her normal routine safely.  And to be able to leave her alone and know she is safe and for her to transition to the cane just to be a bit more independent.  OBJECTIVE:   GAIT: Gait pattern: step through pattern, decreased stride length, decreased hip/knee flexion- Right, decreased hip/knee flexion- Left, trunk flexed, and narrow BOS Distance walked: 35' (consecutively no AD) Assistive device utilized:  none Level of  assistance:  SBA-CGA (w/o device) Cued for paced breathing during task to improve endurance.  Pt has 2 instances of left foot scuff, self-corrected.  As pt approaches final lap and fatigue becomes noticeable with breathing pattern, pt begins to decrease stance time on RLE.  NMR:   -STS no UE support on airex 2x8, pt is dyspneic following each set requiring prolonged seated rest -Standing feet shoulder width apart progressing to narrowed BOS (almost touching) w/ alt UE bean bag toss to lateral targets, pt unable to establish pattern of UE use and coordinated dot color despite one step cuing -STS w/ red theraband x8 - added to HEP -Lateral stepping at countertop w/ red theraband 4x10' - added to HEP -Pt stands on doubled up blue mat feet apart eyes closed 3x20 seconds -Lateral step ups x6 left side only using BUE on rail; pt indicating she is too tired to continue by stopping activity and waving hand at therapist.  Activity D/C'd, session concluded.  PATIENT EDUCATION: Education details: Continue HEP. Person educated: Patient and Spouse Education method: Customer service manager Education comprehension: verbalized understanding, returned demonstration, and needs further education  HOME EXERCISE PROGRAM: Added marching and SLS to HEP Access Code: [email or web address] URL: [email or web address] Date: 12/20/2022 Prepared by: Elease Etienne  Exercises - Side Stepping with Resistance at Thighs and Counter Support  - 1 x daily - 5 x weekly - 3 sets - 10 reps - Sit to Stand with Resistance Around Legs  - 1 x daily - 5 x weekly - 2 sets - 10 reps   Marching In-Place - HOLD ONTO COUNTER FOR SUPPORT    Standing straight, alternate bringing knees toward trunk. Arms swing alternately. Do __1_ sets. Do _1__ times per day.  10 REPS EACH LEG     SINGLE LIMB STANCE - HOLD ONTO COUNTER FOR SUPPORT   Stance: single leg on floor. Raise leg. Hold _10__ seconds. Repeat with other  leg. ___1-2 reps per set, __1_ sets per day, __5_ days per week  Copyright  VHI. All rights reserved.   GOALS: Goals reviewed with patient? Yes  SHORT TERM GOALS: Target date:  01/03/2023  Pt will perform strength, stretching, and balance HEP with supervision from family to maintain progress. Baseline:  To be established. Goal status: INITIAL  2.  BERG to be assessed w/ STG/LTG set as appropriate.  Increase Berg score to >/ 42/56 to demo improvement in balance. Baseline: To be assessed. Goal status: INITIAL  3.  Pt will decrease 5xSTS to </=13 seconds w/ BUE support in order to demonstrate decreased risk for falls and improved functional bilateral LE strength and power. Baseline: 16.86 sec w/ BUE support Goal status: INITIAL  4.  Pt will demonstrate a gait speed of >/=2.5 feet/sec using LRAD in order to decrease risk for falls. Baseline: 2.08 ft/sec w/ RW Goal status: INITIAL  5.  Pt will negotiation 2 stairs w/o rail using LRAD at no more than supervision level in order to improve safe accessibility to home environment. Baseline: Pt needs minA at home to access home via garage or front door. Goal status: INITIAL  LONG TERM GOALS: Target date: 01/31/2023  FOTO to be assessed w/ LTG set. Baseline: To be assessed. Goal status: INITIAL  2.  BERG to be assessed w/ STG/LTG set as appropriate.  Increase Berg score to >/= 47/56 for reduced fall risk.  Baseline: 37/56 on 12-10-22 Goal status: INITIAL  3.  Pt will decrease 5xSTS to </=13 seconds w/o BUE support in order to demonstrate decreased risk for falls and improved functional bilateral LE strength and power. Baseline: 16.86 sec w/ BUE support Goal status: INITIAL  4.  Pt will demonstrate a gait speed of >/=2.98 feet/sec using LRAD in order to decrease risk for falls. Baseline: 2.08 ft/sec w/ RW Goal status: INITIAL  5.  Pt will ambulate >/=500' over level indoor surfaces at modI level to improve endurance and safety with  household mobility. Baseline: 125' w/ RW SBA Goal status: INITIAL  ASSESSMENT:  CLINICAL IMPRESSION: Patient remains limited by dyspnea on exertion with all activity.  She continues to do well with all functional standing tasks, but remains limited by multi-step tasks and visually limited conditions.  She is progressing well towards goals with PT to continue POC.  OBJECTIVE IMPAIRMENTS: Abnormal gait, decreased activity tolerance, decreased balance, decreased strength, increased muscle spasms, improper body mechanics, and postural dysfunction.   ACTIVITY LIMITATIONS: carrying, lifting, bending, squatting, stairs, transfers, bed mobility, and locomotion level  PARTICIPATION LIMITATIONS: meal prep, cleaning, laundry, medication management, driving, shopping, and community activity  PERSONAL FACTORS: Age, Fitness, Past/current experiences, Transportation, and 1-2 comorbidities: recent left THA and HTN  are also affecting patient's functional outcome.   REHAB POTENTIAL: Good  CLINICAL DECISION MAKING: Stable/uncomplicated  EVALUATION COMPLEXITY: Low  PLAN:  PT FREQUENCY: 2x/week  PT DURATION: 8 weeks  PLANNED INTERVENTIONS: Therapeutic exercises, Therapeutic activity, Neuromuscular re-education, Balance training, Gait training, Patient/Family education, Self Care, Joint mobilization, Stair training, Vestibular training, DME instructions, Manual therapy, and Re-evaluation  PLAN FOR NEXT SESSION:   Right NMR, LLE strengthening, balance training-maybe manual dual task, compliant/unlevel surfaces, dynamic tasks, gait training with no device vs RW; stair training, eyes closed, stretching?   Sadie Haber, PT, DPT 12/20/2022, 12:05 PM

## 2022-12-20 NOTE — Therapy (Signed)
OUTPATIENT SPEECH LANGUAGE PATHOLOGY TREATMENT   Patient Name: Kelsey Jacobs MRN: [id number] DOB:26-Oct-1946, 77 y.o., female 42 Date: 12/20/2022  PCP: Larene Beach MD REFERRING PROVIDER: Gwendolyn Lima, FNP  END OF SESSION:  End of Session - 12/20/22 1322     Visit Number 6    Number of Visits 25    Date for SLP Re-Evaluation 02/19/23    Authorization Type medicare    SLP Start Time 1015    SLP Stop Time  1100    SLP Time Calculation (min) 45 min    Activity Tolerance Patient tolerated treatment well               Past Medical History:  Diagnosis Date   Medical history non-contributory    Past Surgical History:  Procedure Laterality Date   BACK SURGERY     FOOT SURGERY     INCISION AND DRAINAGE ABSCESS Left 03/29/2021   Procedure: INCISION AND DRAINAGE AXILLARY ABSCESS;  Surgeon: Jovita Kussmaul, MD;  Location: [facility];  Service: General;  Laterality: Left;   Patient Active Problem List   Diagnosis Date Noted   Axillary abscess 03/30/2021    ONSET DATE: 11/06/22   REFERRING DIAG: S01.093 (ICD-10-CM) - Arterial ischemic stroke, MCA (middle cerebral artery), left, acute ([place]) Z74.09 (ICD-10-CM) - Other reduced mobility Z78.9 (ICD-10-CM) - Other specified health status I69.320 (ICD-10-CM) - Aphasia following cerebral infarction  THERAPY DIAG:  Aphasia  Rationale for Evaluation and Treatment: Rehabilitation  SUBJECTIVE:   SUBJECTIVE STATEMENT: Pt attends with son who reports some use of AAC at home.  PAIN:  Are you having pain? No  OBJECTIVE:   TODAY'S TREATMENT:    12-20-22: Target use of SGD to express wants and needs. Pt able to navigate to appropriate page with rare min-A. Pt selects icon and attempts to verbally repeat. Limited success with verbal repetition, benefits from SLP cues (choral production, visual aid, syllabification, simple and direct language). Identified additional need to add to device; son able to add it independently.  Target naming simple category of fruits. Pt generates 0 just given category, 0/2 given semantic cues. Direct confrontation naming pt with 90% accuracy, though apraxic errors consistently present. Max-A multimodal cueing to approximate target. Relative strength in writing target name.   12-18-22: SLP provided pt with their trial Lingraphica device and A in setting per pt's preferences. Provided education to son on how to modify device and how family can employ to A in communication and therapy practice. With verbal cues, pt able to demonstrate navigation of device through various folders and select dictated icon with 100% accuracy. Pt's son endorses confidence in ability to modify device, will begin with device customization per pt preferences prior to next session.   12-12-22: Target receptive and expressive language in dynamic language task. SLP had pt sort cards into 2 distinct categories, pt with 90% accuracy given rare min-A. Given semantic cues, pt ID appropriate card in 80% of trials (from [facility]), with errors notable for there being a semantic or phonemic similarity in erroneously selection option. SLP requests pt attempt to name, with pt able to name 20% of targets. Marked apraxia continues with usual groping, irregular and erroneous productions of targets despite max-A from SLP. Some benefit observed from reduced rate and visual cues include mouth placement demonstration, syllabification, and written representation of word. Advised pt's trial device would arrive in time for next session. Provided personal interest form for family to fill out.   12-10-22: Target expression of  preferences through employment of low tech multimodal communication. Pt able to relay to SLP level of interest in various activities via 3 selection options (very, somewhat, none) for 24 topics, with pt's non verbal communication and care partners input matching her selection in 100% of trials. Determine dinner preference via binary  options. Spouse endroses successful employment of this strategy at home, they find white board very helpful. Target verbal expression through trials of modified melodic intonation therapy, with pt able to approximate x6/7 fruits given usual modeling, visual and written cues. Visual cues to annotate number of syllables successful in avoiding additional sounds to end of words. Approximations notable for being effortful, inconsistent, and with groping.   12-05-22: Pt has been working on [employer]. Demonstrates ability to write family member names, demonstrates perseveration of erroneous letters in attempts to answer other, personally relevant questions which require one word response. SLP demonstrates supportive communication strategies of providing options and confirming choice. Son endorses improvement in choice making from [place], verbally presented, at home. In naming task, pt able to approximate intelligible production of 3/10 items. Approximations are laborious, with usual paraphasias and occasional neologisms. Pt with usual awareness, evidences frustration at challenges. SLP introduces pt to Lingraphica touch-talk as alternative communication option, as pt's natural speech is not meeting her communication needs currently and SLP suspects extended duration of recovery possible d/t severity of deficits. Pt able to select dictated icon 4/4 trials. Pt able to answer personally relevant questions, with apparent accuracy which was confirmed by SLP use of confirmation communication technique and son's knowledge, in 5/5 trials. Education provided on recommendation for speech generating device and how said device can support pt's communication impairments and subsequent recovery. Pt and son verbalize understanding, are interested in a trial. SLP to order pt a trial device.   11/27/22: Reviewed aphasia education, demonstrated use of written choices, in field of 4, [place] successfully made 2/2 personal preference choices.  Initiated family training on using personally relevant words in theraputic activities at home. Personally relevant words list provided for family to fill out and return. Dalayla is to work on written expression of family, friends an pets.    PATIENT EDUCATION: Education details: Sees today's treatment and patient instructions, aphasia ed Person educated: Patient, Spouse, and Child(ren) Education method: Explanation, Demonstration, Verbal cues, and Handouts Education comprehension: verbalized understanding, verbal cues required, and needs further education   GOALS: Goals reviewed with patient? Yes  SHORT TERM GOALS: Target date: 12/25/22  Pt will name 5 family members with occasional mod A Baseline: Goal status: IN PROGRESS  2.  Pt will answer personally relevant yes/no questions accurately 85% with multimodal communication Baseline:  Goal status: IN PROGRESS  3.  Pt will ID 5 food and shopping preferences using multimodal communication with occasional min A Baseline:  Goal status: IN PROGRESS  4.  Pt will name 6 items in personally relevant categories using AAC/multimodal communication as needed with occasional mod A Baseline:  Goal status: IN PROGRESS  5.  Family/caregivers will carryover 3 communication strategies to support pt's comprehension and expression Baseline:  Goal status: IN PROGRESS   LONG TERM GOALS: Target date: 02/18/22  Pt will initiate use mulitmodal communication to communicate wants/needs 3x over 1 week with usual min A from caregivers Baseline:  Goal status: IN PROGRESS  2.  Pt or caregivers will customize 4 items on AAC system with rare min A  Baseline:  Goal status: IN PROGRESS  3.  Pt will use multimodal communication to communicate 3  grocery items and 3 personal care items she needs with occasional min A from family Baseline:  Goal status: IN PROGRESS  4.  Pt will access online shopping site she uses and purchase a needed item with occasional  min  A from family Baseline:  Goal status: IN PROGRESS  5.  Using compensatory strategies and multimodal communication, pt will take 3turns in conversation with occasional min A Baseline:  Goal status: IN PROGRESS  ASSESSMENT:  CLINICAL IMPRESSION: Patient is a 77 y.o. female who was seen today for aphasia. 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 She named 0/6 items, with 2 incidents of phonemic paraphasias, bite/book and beak/book. Other responses were facial expressions of frustration and non verbal. Spontaneous speech is limited to 1-2 words with groping and halting. She read 0/6 items on QAB, again with phonemic paraphasia of pity/pig. Tauri repeated 0/6 items on QAB. She is inconsistent with yes/no's, family is attempting to help her use thumbs up and down for this. She is not using salient gestures consistently. Prior to CVA, [place] shopped and looked up recipes on line using her computer. They do not have a tablet. She wrote her name, her son's and spouses name. She was not able to write the pets name or other friends/family members. Communication is frustrating for her and her family. I recommend skilled ST to maximize communication for safety, QOL, independence and to reduce caregiver burden. She needs OT and PT as well, family stated they felt this would be too much at once  OBJECTIVE IMPAIRMENTS: include attention, memory, aphasia, and apraxia. These impairments are limiting patient from return to work, managing medications, managing appointments, managing finances, household responsibilities, ADLs/IADLs, and effectively communicating at home and in community. Factors affecting potential to achieve goals and functional outcome are severity of impairments. Patient will benefit from skilled SLP services to address above impairments and improve  overall function.  REHAB POTENTIAL: Good  PLAN:  SLP FREQUENCY: 2x/week  SLP DURATION: 12 weeks  PLANNED INTERVENTIONS: Language facilitation, Environmental controls, Cueing hierachy, Cognitive reorganization, Internal/external aids, Functional tasks, Multimodal communication approach, SLP instruction and feedback, Compensatory strategies, and Patient/family education    Leroy Libman, Student-SLP 12/20/2022, 1:23 PM

## 2022-12-25 ENCOUNTER — Encounter: Payer: Self-pay | Admitting: Physical Therapy

## 2022-12-25 ENCOUNTER — Encounter: Payer: Self-pay | Admitting: Speech Pathology

## 2022-12-25 ENCOUNTER — Ambulatory Visit: Payer: Medicare Other | Admitting: Occupational Therapy

## 2022-12-25 ENCOUNTER — Ambulatory Visit: Payer: Medicare Other | Admitting: Speech Pathology

## 2022-12-25 ENCOUNTER — Encounter: Payer: Self-pay | Admitting: Occupational Therapy

## 2022-12-25 ENCOUNTER — Ambulatory Visit: Payer: Medicare Other | Admitting: Physical Therapy

## 2022-12-25 DIAGNOSIS — R4701 Aphasia: Secondary | ICD-10-CM | POA: Diagnosis not present

## 2022-12-25 DIAGNOSIS — R41842 Visuospatial deficit: Secondary | ICD-10-CM

## 2022-12-25 DIAGNOSIS — I69351 Hemiplegia and hemiparesis following cerebral infarction affecting right dominant side: Secondary | ICD-10-CM

## 2022-12-25 DIAGNOSIS — M6281 Muscle weakness (generalized): Secondary | ICD-10-CM

## 2022-12-25 DIAGNOSIS — R2681 Unsteadiness on feet: Secondary | ICD-10-CM

## 2022-12-25 DIAGNOSIS — R41840 Attention and concentration deficit: Secondary | ICD-10-CM

## 2022-12-25 DIAGNOSIS — R2689 Other abnormalities of gait and mobility: Secondary | ICD-10-CM

## 2022-12-25 NOTE — Therapy (Signed)
OUTPATIENT PHYSICAL THERAPY NEURO TREATMENT NOTE   Patient Name: Kelsey Jacobs MRN: [id number] DOB:1945/12/25, 77 y.o., female Today's Date: 12/25/2022   PCP: Dr. Jaye Beagle [facility]) REFERRING PROVIDER: Renato Gails, MD  END OF SESSION:  PT End of Session - 12/25/22 1147     Visit Number 6    Number of Visits 17   16+eval   Date for PT Re-Evaluation 02/07/23   pushed out due to potential scheduling delay   Authorization Type MEDICARE PART A AND B    PT Start Time 1154   received from ST, pt in restroom at onset of session.   PT Stop Time 1230    PT Time Calculation (min) 36 min    Equipment Utilized During Treatment Gait belt    Activity Tolerance Patient tolerated treatment well    Behavior During Therapy WFL for tasks assessed/performed               Past Medical History:  Diagnosis Date   Medical history non-contributory    Past Surgical History:  Procedure Laterality Date   BACK SURGERY     FOOT SURGERY     INCISION AND DRAINAGE ABSCESS Left 03/29/2021   Procedure: INCISION AND DRAINAGE AXILLARY ABSCESS;  Surgeon: Griselda Miner, MD;  Location: [facility];  Service: General;  Laterality: Left;   Patient Active Problem List   Diagnosis Date Noted   Axillary abscess 03/30/2021    ONSET DATE: 11/28/2022  REFERRING DIAG: R26.9 (ICD-10-CM) - Gait abnormality  THERAPY DIAG:  Muscle weakness (generalized)  Hemiplegia and hemiparesis following cerebral infarction affecting right dominant side (HCC)  Other abnormalities of gait and mobility  Unsteadiness on feet  Rationale for Evaluation and Treatment: Rehabilitation  SUBJECTIVE:                                                                                                                                                                                             SUBJECTIVE STATEMENT:   Pt presents with husband this visit.  Pt and husband deny falls.  Says "yes" when asked if the  HEP remains okay.  Husband states they are trying slow release tylenol and her sleep is hit or miss, but seems to be improving still.  She is endorsing less pain since last session.  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 Yes: NPRS scale: "little bit"/10 Pain location: top of thighs Pain description: sore Aggravating factors: pt unsure Relieving factors: husband says rest  PRECAUTIONS: Fall-anterior hip precautions have been lifted  WEIGHT BEARING RESTRICTIONS: No  FALLS: Has patient fallen in last 6 months? No  LIVING ENVIRONMENT: Lives with: lives with their spouse and lives with their son-son will be leaving eventually, but is there indefinitely  Lives in: House/apartment Stairs: Yes: Internal: 15 steps; on left going up and External: 2 steps; uses handheld assist and door frame Has following equipment at home: Quad cane small base, Walker - 2 wheeled, bed side commode, Grab bars, and transport chair, chair alarm, bed alarm, call button for son, insertable bed rails  PLOF: Independent with transfers, Needs assistance with ADLs, Needs assistance with homemaking, Needs assistance with gait, and typically has supervision with transfers  PATIENT GOALS: PT asks pt if she would like to get stronger to which she nods.  Son would like for mom to get back to her normal routine safely.  And to be able to leave her alone and know she is safe and for her to transition to the cane just to be a bit more independent.  OBJECTIVE:   NMR: -Lateral step ups x10 each side using BUE support on rail and intermittent CGA from therapist, pt tolerated better this session than prior w/o reported pain or fatigue. -Standing on doubled blue mat alt wide semi-tandem no UE support eyes closed 2x30 sec, CGA due to right lateral lean persistent throughout. -On  single layer blue mat alternating advance retreat over 4" hurdle progressing from RUE support to none and CGA, increased cuing for RLE focused task completion  TherEx: -Seated hamstring stretch bilaterally 2x45 seconds, pt endorses left hip discomfort, right tingling when asked w/ palpable tightness in calf and hamstring -Attempted supine hip stretches w/ pt endorsing dizziness, when inquired about pt acknowledges dizziness at night when getting into bed.  Husband states pt has never endorsed this before now. -Long-sitting calf stretch 2x45 seconds, attempted in seated position w/ block prior w/ pt requiring increased assist to perform.  PATIENT EDUCATION: Education details: Continue HEP. Person educated: Patient and Spouse Education method: Customer service manager Education comprehension: verbalized understanding, returned demonstration, and needs further education  HOME EXERCISE PROGRAM: Added marching and SLS to HEP Access Code: [email or web address] URL: [email or web address] Date: 12/25/2022 Prepared by: Elease Etienne  Exercises - Side Stepping with Resistance at Thighs and Counter Support  - 1 x daily - 5 x weekly - 3 sets - 10 reps - Sit to Stand with Resistance Around Legs  - 1 x daily - 5 x weekly - 2 sets - 10 reps - Seated Hamstring Stretch  - 1 x daily - 5 x weekly - 1 sets - 2-3 reps - 45 seconds hold - Long Sitting Calf Stretch with Strap  - 1 x daily - 5 x weekly - 1 sets - 2-3 reps - 45 seconds hold   Marching In-Place - HOLD ONTO COUNTER FOR SUPPORT    Standing straight, alternate bringing knees toward trunk. Arms swing alternately. Do __1_ sets. Do _1__ times per day.  10 REPS EACH LEG     SINGLE LIMB STANCE - HOLD ONTO COUNTER FOR SUPPORT   Stance: single leg on floor. Raise leg. Hold _10__ seconds. Repeat with other leg. ___1-2 reps per set, __1_ sets per day, __5_ days per week  Copyright  VHI. All rights reserved.   GOALS: Goals reviewed  with patient? Yes  SHORT TERM GOALS: Target date: 01/03/2023  Pt will perform strength, stretching, and balance HEP with supervision from family to maintain  progress. Baseline:  To be established. Goal status: INITIAL  2.  BERG to be assessed w/ STG/LTG set as appropriate.  Increase Berg score to >/ 42/56 to demo improvement in balance. Baseline: To be assessed. Goal status: INITIAL  3.  Pt will decrease 5xSTS to </=13 seconds w/ BUE support in order to demonstrate decreased risk for falls and improved functional bilateral LE strength and power. Baseline: 16.86 sec w/ BUE support Goal status: INITIAL  4.  Pt will demonstrate a gait speed of >/=2.5 feet/sec using LRAD in order to decrease risk for falls. Baseline: 2.08 ft/sec w/ RW Goal status: INITIAL  5.  Pt will negotiation 2 stairs w/o rail using LRAD at no more than supervision level in order to improve safe accessibility to home environment. Baseline: Pt needs minA at home to access home via garage or front door. Goal status: INITIAL  LONG TERM GOALS: Target date: 01/31/2023  FOTO to be assessed w/ LTG set. Baseline: To be assessed. Goal status: INITIAL  2.  BERG to be assessed w/ STG/LTG set as appropriate.  Increase Berg score to >/= 47/56 for reduced fall risk.  Baseline: 37/56 on 12-10-22 Goal status: INITIAL  3.  Pt will decrease 5xSTS to </=13 seconds w/o BUE support in order to demonstrate decreased risk for falls and improved functional bilateral LE strength and power. Baseline: 16.86 sec w/ BUE support Goal status: INITIAL  4.  Pt will demonstrate a gait speed of >/=2.98 feet/sec using LRAD in order to decrease risk for falls. Baseline: 2.08 ft/sec w/ RW Goal status: INITIAL  5.  Pt will ambulate >/=500' over level indoor surfaces at modI level to improve endurance and safety with household mobility. Baseline: 125' w/ RW SBA Goal status: INITIAL  ASSESSMENT:  CLINICAL IMPRESSION: Attempted to stretch LE this  session to address ongoing pain in LE with pt reporting dizziness in supine which is a position the patient does not lay in frequently so likely a positional intolerance vs other inner ear deficit.  Will further address any hip tightness in future visit.  Continued to progress dynamic balance using compliant surface variations and decreased UE support with pt tolerating challenge without issue.  Will progress towards LTGs as able.  OBJECTIVE IMPAIRMENTS: Abnormal gait, decreased activity tolerance, decreased balance, decreased strength, increased muscle spasms, improper body mechanics, and postural dysfunction.   ACTIVITY LIMITATIONS: carrying, lifting, bending, squatting, stairs, transfers, bed mobility, and locomotion level  PARTICIPATION LIMITATIONS: meal prep, cleaning, laundry, medication management, driving, shopping, and community activity  PERSONAL FACTORS: Age, Fitness, Past/current experiences, Transportation, and 1-2 comorbidities: recent left THA and HTN  are also affecting patient's functional outcome.   REHAB POTENTIAL: Good  CLINICAL DECISION MAKING: Stable/uncomplicated  EVALUATION COMPLEXITY: Low  PLAN:  PT FREQUENCY: 2x/week  PT DURATION: 8 weeks  PLANNED INTERVENTIONS: Therapeutic exercises, Therapeutic activity, Neuromuscular re-education, Balance training, Gait training, Patient/Family education, Self Care, Joint mobilization, Stair training, Vestibular training, DME instructions, Manual therapy, and Re-evaluation  PLAN FOR NEXT SESSION:   Right NMR, LLE strengthening, balance training-maybe manual dual task, compliant/unlevel surfaces, dynamic tasks, gait training with no device vs RW; stair training, eyes closed, attempt hip stretching-dizzy?   Bary Richard, PT, DPT 12/25/2022, 1:06 PM

## 2022-12-25 NOTE — Patient Instructions (Addendum)
1. Look for the edge of objects (to the left and/or right) so that you make sure you are seeing all of an object 2. Turn your head when walking, scan from side to side, particularly in busy environments 3. Use an organized scanning pattern. It's usually easier to scan from top to bottom, and left to right (like you are reading) 4. Double check yourself 5. Use a line guide (like a blank piece of paper) or your finger when reading 6. If necessary, place brightly colored tape at end of table or work area as a reminder to always look until you see the tape.   

## 2022-12-25 NOTE — Therapy (Signed)
OUTPATIENT SPEECH LANGUAGE PATHOLOGY TREATMENT   Patient Name: Kelsey Jacobs MRN: [id number] DOB:1946-02-19, 77 y.o., female Today's Date: 12/25/2022  PCP: Kelsey Honey MD REFERRING PROVIDER: Basilia Jumbo, FNP  END OF SESSION:  End of Session - 12/25/22 1107     Visit Number 7    Number of Visits 25    Date for SLP Re-Evaluation 02/19/23    Authorization Type medicare    SLP Start Time 1100    SLP Stop Time  1145    SLP Time Calculation (min) 45 min    Activity Tolerance Patient tolerated treatment well               Past Medical History:  Diagnosis Date   Medical history non-contributory    Past Surgical History:  Procedure Laterality Date   BACK SURGERY     FOOT SURGERY     INCISION AND DRAINAGE ABSCESS Left 03/29/2021   Procedure: INCISION AND DRAINAGE AXILLARY ABSCESS;  Surgeon: Kelsey Miner, MD;  Location: [facility];  Service: General;  Laterality: Left;   Patient Active Problem List   Diagnosis Date Noted   Axillary abscess 03/30/2021    ONSET DATE: 11/06/22   REFERRING DIAG: E95.284 (ICD-10-CM) - Arterial ischemic stroke, MCA (middle cerebral artery), left, acute (HCC) Z74.09 (ICD-10-CM) - Other reduced mobility Z78.9 (ICD-10-CM) - Other specified health status I69.320 (ICD-10-CM) - Aphasia following cerebral infarction  THERAPY DIAG:  Aphasia  Rationale for Evaluation and Treatment: Rehabilitation  SUBJECTIVE:   SUBJECTIVE STATEMENT: Pt attends with sposue  PAIN:  Are you having pain? No  OBJECTIVE:   TODAY'S TREATMENT:   12-25-22: Targeted multimodal communication using SGD to ID preferences of restaurants and shops. Demonstrated steps to customize device with spouse. Added 4 favorite online shops under "going out" and 2 restaurants ([employer], [employer]) With usual min to mod A, Prapti navigated device to select food then restaurants with cues to correct 2 errors and use to back arrow rather than home button to stay in the  same folder when error needs to be corrected. Using SGD, she named 5 family and 2 friends, repeating after device with limited success. She benefitted from choral production, visual aid, syllabification, simple and direct language to approximate names, stores and restaurants. Added hair cut, mani/pedi icons under going out as well.   12-20-22: Target use of SGD to express wants and needs. Pt able to navigate to appropriate page with rare min-A. Pt selects icon and attempts to verbally repeat. Limited success with verbal repetition, benefits from SLP cues (choral production, visual aid, syllabification, simple and direct language). Identified additional need to add to device; son able to add it independently. Target naming simple category of fruits. Pt generates 0 just given category, 0/2 given semantic cues. Direct confrontation naming pt with 90% accuracy, though apraxic errors consistently present. Max-A multimodal cueing to approximate target. Relative strength in writing target name.   12-18-22: SLP provided pt with their trial Lingraphica device and A in setting per pt's preferences. Provided education to son on how to modify device and how family can employ to A in communication and therapy practice. With verbal cues, pt able to demonstrate navigation of device through various folders and select dictated icon with 100% accuracy. Pt's son endorses confidence in ability to modify device, will begin with device customization per pt preferences prior to next session.   12-12-22: Target receptive and expressive language in dynamic language task. SLP had pt sort cards into 2  distinct categories, pt with 90% accuracy given rare min-A. Given semantic cues, pt ID appropriate card in 80% of trials (from [facility]), with errors notable for there being a semantic or phonemic similarity in erroneously selection option. SLP requests pt attempt to name, with pt able to name 20% of targets. Marked apraxia continues with usual  groping, irregular and erroneous productions of targets despite max-A from SLP. Some benefit observed from reduced rate and visual cues include mouth placement demonstration, syllabification, and written representation of word. Advised pt's trial device would arrive in time for next session. Provided personal interest form for family to fill out.   12-10-22: Target expression of preferences through employment of low tech multimodal communication. Pt able to relay to SLP level of interest in various activities via 3 selection options (very, somewhat, none) for 24 topics, with pt's non verbal communication and care partners input matching her selection in 100% of trials. Determine dinner preference via binary options. Spouse endroses successful employment of this strategy at home, they find white board very helpful. Target verbal expression through trials of modified melodic intonation therapy, with pt able to approximate x6/7 fruits given usual modeling, visual and written cues. Visual cues to annotate number of syllables successful in avoiding additional sounds to end of words. Approximations notable for being effortful, inconsistent, and with groping.    PATIENT EDUCATION: Education details: Sees today's treatment and patient instructions, aphasia ed Person educated: Patient, Spouse, and Child(ren) Education method: Explanation, Demonstration, Verbal cues, and Handouts Education comprehension: verbalized understanding, verbal cues required, and needs further education   GOALS: Goals reviewed with patient? Yes  SHORT TERM GOALS: Target date: 12/25/22  Pt will name 5 family members with occasional mod A Baseline: Goal status: IN PROGRESS  2.  Pt will answer personally relevant yes/no questions accurately 85% with multimodal communication Baseline:  Goal status: IN PROGRESS  3.  Pt will ID 5 food and shopping preferences using multimodal communication with occasional min A Baseline:  Goal  status: IN PROGRESS  4.  Pt will name 6 items in personally relevant categories using AAC/multimodal communication as needed with occasional mod A Baseline:  Goal status: IN PROGRESS  5.  Family/caregivers will carryover 3 communication strategies to support pt's comprehension and expression Baseline:  Goal status: IN PROGRESS   LONG TERM GOALS: Target date: 02/18/22  Pt will initiate use mulitmodal communication to communicate wants/needs 3x over 1 week with usual min A from caregivers Baseline:  Goal status: IN PROGRESS  2.  Pt or caregivers will customize 4 items on AAC system with rare min A  Baseline:  Goal status: IN PROGRESS  3.  Pt will use multimodal communication to communicate 3 grocery items and 3 personal care items she needs with occasional min A from family Baseline:  Goal status: IN PROGRESS  4.  Pt will access online shopping site she uses and purchase a needed item with occasional  min A from family Baseline:  Goal status: IN PROGRESS  5.  Using compensatory strategies and multimodal communication, pt will take 3turns in conversation with occasional min A Baseline:  Goal status: IN PROGRESS  ASSESSMENT:  CLINICAL IMPRESSION: Patient is a 77 y.o. female who was seen today for aphasia. 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 She named 0/6 items, with 2 incidents of phonemic  paraphasias, bite/book and beak/book. Other responses were facial expressions of frustration and non verbal. Spontaneous speech is limited to 1-2 words with groping and halting. She read 0/6 items on QAB, again with phonemic paraphasia of pity/pig. Eydie repeated 0/6 items on QAB. She is inconsistent with yes/no's, family is attempting to help her use thumbs up and down for this. She is not using salient gestures consistently. Prior to CVA, [place]  shopped and looked up recipes on line using her computer. They do not have a tablet. She wrote her name, her son's and spouses name. She was not able to write the pets name or other friends/family members. Communication is frustrating for her and her family. I recommend skilled ST to maximize communication for safety, QOL, independence and to reduce caregiver burden. She needs OT and PT as well, family stated they felt this would be too much at once  OBJECTIVE IMPAIRMENTS: include attention, memory, aphasia, and apraxia. These impairments are limiting patient from return to work, managing medications, managing appointments, managing finances, household responsibilities, ADLs/IADLs, and effectively communicating at home and in community. Factors affecting potential to achieve goals and functional outcome are severity of impairments. Patient will benefit from skilled SLP services to address above impairments and improve overall function.  REHAB POTENTIAL: Good  PLAN:  SLP FREQUENCY: 2x/week  SLP DURATION: 12 weeks  PLANNED INTERVENTIONS: Language facilitation, Environmental controls, Cueing hierachy, Cognitive reorganization, Internal/external aids, Functional tasks, Multimodal communication approach, SLP instruction and feedback, Compensatory strategies, and Patient/family education    Kelsey Jacobs, CCC-SLP 12/25/2022, 2:31 PM

## 2022-12-25 NOTE — Therapy (Signed)
OUTPATIENT OCCUPATIONAL THERAPY NEURO TREATMENT  Patient Name: Kelsey Jacobs MRN: [id number] DOB:[birth year], 77 y.o., female Today's Date: 12/25/2022  PCP: Dr. Vanessa Barbara  REFERRING PROVIDER: Levada Schilling, MD  END OF SESSION:  OT End of Session - 12/25/22 1405     Visit Number 3    Number of Visits 17    Date for OT Re-Evaluation 02/16/23    Authorization Type MCR, UHC    Progress Note Due on Visit 10    OT Start Time 1018    OT Stop Time 1101    OT Time Calculation (min) 43 min    Activity Tolerance Patient tolerated treatment well    Behavior During Therapy WFL for tasks assessed/performed             Past Medical History:  Diagnosis Date   Medical history non-contributory    Past Surgical History:  Procedure Laterality Date   BACK SURGERY     FOOT SURGERY     INCISION AND DRAINAGE ABSCESS Left 03/29/2021   Procedure: INCISION AND DRAINAGE AXILLARY ABSCESS;  Surgeon: Autumn Messing III, MD;  Location: [facility];  Service: General;  Laterality: Left;   Patient Active Problem List   Diagnosis Date Noted   Axillary abscess 03/30/2021    ONSET DATE: 12/04/2022 referral date  REFERRING DIAG:  Diagnosis  I63.512 (ICD-10-CM) - Cerebral infarction due to unspecified occlusion or stenosis of left middle cerebral artery  Z74.09 (ICD-10-CM) - Other reduced mobility  Z78.9 (ICD-10-CM) - Other specified health status  I69.320 (ICD-10-CM) - Aphasia following cerebral infarction    THERAPY DIAG:  Muscle weakness (generalized)  Hemiplegia and hemiparesis following cerebral infarction affecting right dominant side (HCC)  Aphasia  Visuospatial deficit  Attention and concentration deficit  Rationale for Evaluation and Treatment: Rehabilitation  SUBJECTIVE:   SUBJECTIVE STATEMENT: She has been completing her dowel HEP with her son. She does not want to use her communication device. She is completing LB dressing more consistently since her last visit.   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  2/10 LT hip/femur - O.T. not addressing  FALLS: Has patient fallen in last 6 months? No  LIVING ENVIRONMENT: Lives with: lives with their spouse and lives with their son-son will be leaving eventually, but is there indefinitely  Lives in: House/apartment (pt lives on first floor)  Stairs: Yes: Internal: 15 steps; on left going up and External: 2 steps; uses handheld assist and door frame Has following equipment at home: Quad cane small base, Walker - 2 wheeled, bed side commode, Grab bars, and transport chair, chair alarm, bed alarm, call button for son, insertable bed rails   PLOF: Independent with transfers, Needs assistance with ADLs, Needs assistance with homemaking, Needs assistance with gait, and typically has supervision with transfers   PATIENT GOALS: PT asks pt if she would like to get stronger to which she nods.  Son would like for mom to get back to her normal routine safely.  And to be able to leave her alone and know she is safe and for her to transition to the cane just to be a bit more independent.    OBJECTIVE: (from evaluation unless otherwise noted)  HAND DOMINANCE: Right  ADLs: Overall ADLs: mostly supervision/cueing, min assist at times Transfers/ambulation related to ADLs:  Eating: mod I  Grooming: mod I  UB Dressing: mod I  LB Dressing: min assist to don Lt foot in pants and occasionally Lt sock, slip on shoes Toileting: mod I (occasional assist for perineal care w/ BM)  Bathing: set up, cues for thoroughness Tub Shower transfers: supervision/cues Equipment:  BSC for shower seat, grab bars  IADLs: Shopping: husband has done for several years Light housekeeping: dependent, has cleaning lady for heavier  cleaning 1x/month Meal Prep: dependent at this time Community mobility: not currently driving - relying on family Medication management: husband administering Financial management: son has been doing Handwriting: 100% legible and print, name only  MOBILITY STATUS:  uses walker   UPPER EXTREMITY ROM:  BUE AROM WNLs   UPPER EXTREMITY MMT:   RUE grossly 4/5, LUE MMT grossly 5/5   HAND FUNCTION: Grip strength: Right: 38.1 lbs; Left: 33.0 lbs  COORDINATION: 9 Hole Peg test: Right: 28 sec; Left: 26 sec  SENSATION: Slightly dulled RUE, pt appears to detect stimuli and localization  EDEMA: none   COGNITION: Overall cognitive status: Difficulty to assess due to: Communication impairment (aphasia). Pt can follow 1 step visual cues/demo  VISION: Subjective report: bluriness since stroke Baseline vision: Wears glasses all the time Visual history: corrective eye surgery  VISION ASSESSMENT: Not tested D/t aphasia   PERCEPTION: Not tested  PRAXIS: Not tested  OBSERVATIONS: Pt mostly limited by Lemmie Evens, overall needs sup/min assist for BADLS   TODAY'S TREATMENT:                                                                                                                              - Therapeutic exercises completed for duration as noted below including:  OT reviewed dowel AAROM HEP including shoulder flex, ER, shoulder horizontal abduction and adduction, and chest press for improved ROM of affected extremity. Pt completed each exercise x10 each  Placement and removal of yellow, red, blue, black, and green  resistive clips with use of R 3 point pinch for strengthening of affected extremity. Pt able to place clips vertically for additional challenge to shoulder ROM of same, ipsilateral side.  Breathing exercises for exercise recovery using Breathe App. Pt encouraged to use similar app at home to help deepen breaths.  Wrist jux-a-cisor with use of R with cues to keep full  grasp on base at all times x5 for improved wrist ROM and grip strength of affected extremity   PATIENT EDUCATION: Education details: dowel HEP; visual scanning Person educated: Patient and Spouse Education method: Explanation, Demonstration, Tactile cues, Verbal cues, and Handouts Education comprehension: verbalized understanding, returned demonstration, verbal cues required, tactile cues required, and needs further education  HOME EXERCISE PROGRAM: 1/12 - dowel HEP   GOALS: Goals reviewed with patient? Yes  SHORT TERM GOALS: Target date: 01/18/23  Independent with HEP for RUE strengthening Baseline: Goal status: INITIAL  2.  Pt to perform LE dressing (donning pants and Lt sock) w/ AE  and no more than min cues Baseline:  Goal status: INITIAL  3.  Pt to prepare simple snack, sandwich, or microwaveable items with min cues and direct sup Baseline:  Goal status: INITIAL  4.  Pt to perform bathing and perineal care with no more than min cues/sup Baseline:  Goal status: INITIAL   LONG TERM GOALS: Target date: 02/16/23  Independent with all BADLS Baseline:  Goal status: INITIAL  2.  Pt to perform familiar stovetop meal with direct supervision and min cues prn Baseline:  Goal status: INITIAL  3.  Pt to consistently perform light IADLS including washing dishes and laundry Baseline:  Goal status: INITIAL   ASSESSMENT:  CLINICAL IMPRESSION: Pt is good candidate for skilled OT services following CVA to improve independence and safety with ADLs and IADLs.   PERFORMANCE DEFICITS: in functional skills including ADLs, IADLs, coordination, dexterity, sensation, strength, mobility, body mechanics, decreased knowledge of use of DME, and UE functional use, cognitive skills including attention and safety awareness, and psychosocial skills including coping strategies.   IMPAIRMENTS: are limiting patient from ADLs, IADLs, leisure, and social participation.   CO-MORBIDITIES: may have  co-morbidities  that affects occupational performance. Patient will benefit from skilled OT to address above impairments and improve overall function.  REHAB POTENTIAL: Good  PLAN:  OT FREQUENCY: 2x/week  OT DURATION: 8 weeks, plus evaluation (may only need 6 weeks)   PLANNED INTERVENTIONS: self care/ADL training, therapeutic exercise, therapeutic activity, neuromuscular re-education, balance training, functional mobility training, moist heat, patient/family education, cognitive remediation/compensation, visual/perceptual remediation/compensation, coping strategies training, and DME and/or AE instructions  RECOMMENDED OTHER SERVICES: none at this time  CONSULTED AND AGREED WITH PLAN OF CARE: Patient and family member/caregiver  PLAN FOR NEXT SESSION: review/practice LE dressing including Lt sock, progress to strengthening as tolerated; env scanning review   Dennis Bast, OT 12/25/2022, 2:08 PM

## 2022-12-25 NOTE — Patient Instructions (Signed)
Access Code: [email or web address] URL: [email or web address] Date: 12/25/2022 Prepared by: Elease Etienne  Exercises - Side Stepping with Resistance at Thighs and Counter Support  - 1 x daily - 5 x weekly - 3 sets - 10 reps - Sit to Stand with Resistance Around Legs  - 1 x daily - 5 x weekly - 2 sets - 10 reps - Seated Hamstring Stretch  - 1 x daily - 5 x weekly - 1 sets - 2-3 reps - 45 seconds hold - Long Sitting Calf Stretch with Strap  - 1 x daily - 5 x weekly - 1 sets - 2-3 reps - 45 seconds hold

## 2022-12-27 ENCOUNTER — Encounter: Payer: Self-pay | Admitting: Physical Therapy

## 2022-12-27 ENCOUNTER — Ambulatory Visit: Payer: Medicare Other | Admitting: Occupational Therapy

## 2022-12-27 ENCOUNTER — Ambulatory Visit: Payer: Medicare Other | Admitting: Physical Therapy

## 2022-12-27 ENCOUNTER — Encounter: Payer: Self-pay | Admitting: Occupational Therapy

## 2022-12-27 ENCOUNTER — Ambulatory Visit: Payer: Medicare Other | Admitting: Speech Pathology

## 2022-12-27 DIAGNOSIS — R41842 Visuospatial deficit: Secondary | ICD-10-CM

## 2022-12-27 DIAGNOSIS — R4701 Aphasia: Secondary | ICD-10-CM

## 2022-12-27 DIAGNOSIS — I69351 Hemiplegia and hemiparesis following cerebral infarction affecting right dominant side: Secondary | ICD-10-CM

## 2022-12-27 DIAGNOSIS — M6281 Muscle weakness (generalized): Secondary | ICD-10-CM

## 2022-12-27 DIAGNOSIS — R2681 Unsteadiness on feet: Secondary | ICD-10-CM

## 2022-12-27 DIAGNOSIS — R2689 Other abnormalities of gait and mobility: Secondary | ICD-10-CM

## 2022-12-27 DIAGNOSIS — R41840 Attention and concentration deficit: Secondary | ICD-10-CM

## 2022-12-27 NOTE — Patient Instructions (Signed)
Settings  Accessibility   Spoken Content  Use the button on your screen to read written stuff out loud

## 2022-12-27 NOTE — Therapy (Signed)
OUTPATIENT OCCUPATIONAL THERAPY NEURO TREATMENT  Patient Name: Kelsey Jacobs MRN: [id number] DOB:December 17, 1945, 77 y.o., female Today's Date: 12/27/2022  PCP: Dr. Vanessa Barbara  REFERRING PROVIDER: Levada Schilling, MD  END OF SESSION:  OT End of Session - 12/27/22 1008     Visit Number 4    Number of Visits 17    Date for OT Re-Evaluation 02/16/23    Authorization Type MCR, UHC    Progress Note Due on Visit 10    OT Start Time [id number]    OT Stop Time 1015    OT Time Calculation (min) 38 min    Activity Tolerance Patient tolerated treatment well    Behavior During Therapy WFL for tasks assessed/performed              Past Medical History:  Diagnosis Date   Medical history non-contributory    Past Surgical History:  Procedure Laterality Date   BACK SURGERY     FOOT SURGERY     INCISION AND DRAINAGE ABSCESS Left 03/29/2021   Procedure: INCISION AND DRAINAGE AXILLARY ABSCESS;  Surgeon: Autumn Messing III, MD;  Location: [facility];  Service: General;  Laterality: Left;   Patient Active Problem List   Diagnosis Date Noted   Axillary abscess 03/30/2021    ONSET DATE: 12/04/2022 referral date  REFERRING DIAG:  Diagnosis  I63.512 (ICD-10-CM) - Cerebral infarction due to unspecified occlusion or stenosis of left middle cerebral artery  Z74.09 (ICD-10-CM) - Other reduced mobility  Z78.9 (ICD-10-CM) - Other specified health status  I69.320 (ICD-10-CM) - Aphasia following cerebral infarction    THERAPY DIAG:  Muscle weakness (generalized)  Hemiplegia and hemiparesis following cerebral infarction affecting right dominant side (HCC)  Visuospatial deficit  Attention and concentration deficit  Rationale for Evaluation and Treatment: Rehabilitation  SUBJECTIVE:   SUBJECTIVE STATEMENT: She has been completing her dowel HEP with her son. She does not want to use her communication device. She is completing LB dressing more consistently since her last visit.   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  4/10 RT thigh - O.T. not addressing  FALLS: Has patient fallen in last 6 months? No  LIVING ENVIRONMENT: Lives with: lives with their spouse and lives with their son-son will be leaving eventually, but is there indefinitely  Lives in: House/apartment (pt lives on first floor)  Stairs: Yes: Internal: 15 steps; on left going up and External: 2 steps; uses handheld assist and door frame Has following equipment at home: Quad cane small base, Walker - 2 wheeled, bed side commode, Grab bars, and transport chair, chair alarm, bed alarm, call button for son, insertable bed rails   PLOF: Independent with transfers, Needs assistance with ADLs, Needs assistance with homemaking, Needs assistance with gait, and typically has supervision with transfers   PATIENT GOALS: PT asks pt if she would like to get stronger to which she nods.  Son would like for mom to get back to her normal routine safely.  And to be able to leave her alone and know she is safe and for her to transition to the cane just to be a bit more independent.    OBJECTIVE: (from evaluation unless otherwise noted)  HAND DOMINANCE: Right  ADLs: Overall ADLs: mostly supervision/cueing, min assist at times Transfers/ambulation related to ADLs: Eating:  mod I  Grooming: mod I  UB Dressing: mod I  LB Dressing: min assist to don Lt foot in pants and occasionally Lt sock, slip on shoes Toileting: mod I (occasional assist for perineal care w/ BM)  Bathing: set up, cues for thoroughness Tub Shower transfers: supervision/cues Equipment:  BSC for shower seat, grab bars  IADLs: Shopping: husband has done for several years Light housekeeping: dependent, has cleaning lady for heavier cleaning  1x/month Meal Prep: dependent at this time Community mobility: not currently driving - relying on family Medication management: husband administering Financial management: son has been doing Handwriting: 100% legible and print, name only  MOBILITY STATUS:  uses walker   UPPER EXTREMITY ROM:  BUE AROM WNLs   UPPER EXTREMITY MMT:   RUE grossly 4/5, LUE MMT grossly 5/5   HAND FUNCTION: Grip strength: Right: 38.1 lbs; Left: 33.0 lbs  COORDINATION: 9 Hole Peg test: Right: 28 sec; Left: 26 sec  SENSATION: Slightly dulled RUE, pt appears to detect stimuli and localization  EDEMA: none   COGNITION: Overall cognitive status: Difficulty to assess due to: Communication impairment (aphasia). Pt can follow 1 step visual cues/demo  VISION: Subjective report: bluriness since stroke Baseline vision: Wears glasses all the time Visual history: corrective eye surgery  VISION ASSESSMENT: Not tested D/t aphasia   PERCEPTION: Not tested  PRAXIS: Not tested  OBSERVATIONS: Pt mostly limited by Lemmie Evens, overall needs sup/min assist for BADLS   TODAY'S TREATMENT:                                                                                                                              - Therapeutic activities completed for duration as noted below including:  Using RUE for ROM and coordination, pt placed rubber washers on rods in front of him of various heights and sizes.    Breathing exercises attempted with use of a breathing app. Therapist educating on finding an option that does not include holding a breath but solely focuses on inhaling and exhaling for 3-4 seconds.   Large, wooden, 24 piece puzzle assembly using R hand and moderate assistance from therapist for sequencing and to encourage continuation of activity as pt became frustrated/overwhelmed without breaking activity down into smaller tasks/options.   Patient participated in 6 games of Connect 4 with son requiring mod  cueing for proper play, no cues to only use affected R hand with manipulation of pieces,for fine motor coordination, item recognition, and comprehension/strategy following CVA.   PATIENT EDUCATION: Education details: Financial planner Person educated: Patient and Spouse Education method: Explanation, Demonstration, Tactile cues, and Verbal cues Education comprehension: verbalized understanding, returned demonstration, verbal cues required, tactile cues required, and needs further education  HOME EXERCISE PROGRAM: 1/12 - dowel HEP   GOALS: Goals reviewed with patient? Yes  SHORT TERM GOALS: Target date: 01/18/23  Independent with HEP for RUE strengthening Baseline: Goal status: INITIAL  2.  Pt to perform LE dressing (donning pants  and Lt sock) w/ AE and no more than min cues Baseline:  Goal status: INITIAL  3.  Pt to prepare simple snack, sandwich, or microwaveable items with min cues and direct sup Baseline:  Goal status: INITIAL  4.  Pt to perform bathing and perineal care with no more than min cues/sup Baseline:  Goal status: INITIAL   LONG TERM GOALS: Target date: 02/16/23  Independent with all BADLS Baseline:  Goal status: INITIAL  2.  Pt to perform familiar stovetop meal with direct supervision and min cues prn Baseline:  Goal status: INITIAL  3.  Pt to consistently perform light IADLS including washing dishes and laundry Baseline:  Goal status: INITIAL   ASSESSMENT:  CLINICAL IMPRESSION: Pt is good candidate for skilled OT services following CVA to improve independence and safety with ADLs and IADLs.   PERFORMANCE DEFICITS: in functional skills including ADLs, IADLs, coordination, dexterity, sensation, strength, mobility, body mechanics, decreased knowledge of use of DME, and UE functional use, cognitive skills including attention and safety awareness, and psychosocial skills including coping strategies.   IMPAIRMENTS: are limiting patient from ADLs, IADLs,  leisure, and social participation.   CO-MORBIDITIES: may have co-morbidities  that affects occupational performance. Patient will benefit from skilled OT to address above impairments and improve overall function.  REHAB POTENTIAL: Good  PLAN:  OT FREQUENCY: 2x/week  OT DURATION: 8 weeks, plus evaluation (may only need 6 weeks)   PLANNED INTERVENTIONS: self care/ADL training, therapeutic exercise, therapeutic activity, neuromuscular re-education, balance training, functional mobility training, moist heat, patient/family education, cognitive remediation/compensation, visual/perceptual remediation/compensation, coping strategies training, and DME and/or AE instructions  RECOMMENDED OTHER SERVICES: none at this time  CONSULTED AND AGREED WITH PLAN OF CARE: Patient and family member/caregiver  PLAN FOR NEXT SESSION: review/practice LE dressing including Lt sock, progress to strengthening as tolerated; env scanning review   Dennis Bast, OT 12/27/2022, 10:30 AM

## 2022-12-27 NOTE — Therapy (Signed)
OUTPATIENT PHYSICAL THERAPY NEURO TREATMENT NOTE   Patient Name: MURIAH HARSHA MRN: [id number] DOB:July 03, 1946, 77 y.o., female Today's Date: 12/27/2022   PCP: Dr. Michell Heinrich [facility] at [facility] [place]: Levada Schilling, MD  END OF SESSION:  PT End of Session - 12/27/22 1021     Visit Number 7    Number of Visits 17   16+eval   Date for PT Re-Evaluation 02/07/23   pushed out due to potential scheduling delay   Authorization Type MEDICARE PART A AND B    Progress Note Due on Visit 10    PT Start Time 1017    PT Stop Time 1055    PT Time Calculation (min) 38 min    Equipment Utilized During Treatment Gait belt    Activity Tolerance Patient tolerated treatment well    Behavior During Therapy WFL for tasks assessed/performed               Past Medical History:  Diagnosis Date   Medical history non-contributory    Past Surgical History:  Procedure Laterality Date   BACK SURGERY     FOOT SURGERY     INCISION AND DRAINAGE ABSCESS Left 03/29/2021   Procedure: INCISION AND DRAINAGE AXILLARY ABSCESS;  Surgeon: Jovita Kussmaul, MD;  Location: [facility];  Service: General;  Laterality: Left;   Patient Active Problem List   Diagnosis Date Noted   Axillary abscess 03/30/2021    ONSET DATE: 11/28/2022  REFERRING DIAG: R26.9 (ICD-10-CM) - Gait abnormality  THERAPY DIAG:  Muscle weakness (generalized)  Hemiplegia and hemiparesis following cerebral infarction affecting right dominant side ([place])  Other abnormalities of gait and mobility  Unsteadiness on feet  Rationale for Evaluation and Treatment: Rehabilitation  SUBJECTIVE:                                                                                                                                                                                             SUBJECTIVE STATEMENT:   Pt presents with son this visit who leaves following handoff from OT.  Pt denies falls, son indicates sleep is  getting better and she slept 9 hours, denies severe discomfort following therapy as she had first night.    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 Yes: NPRS scale: "little bit"/10 Pain location: top of right thigh Pain description: sore Aggravating factors: pt unsure Relieving factors: pt unsure  PRECAUTIONS: Fall-anterior hip precautions have been lifted  WEIGHT BEARING RESTRICTIONS: No  FALLS: Has patient  fallen in last 6 months? No  LIVING ENVIRONMENT: Lives with: lives with their spouse and lives with their son-son will be leaving eventually, but is there indefinitely  Lives in: House/apartment Stairs: Yes: Internal: 15 steps; on left going up and External: 2 steps; uses handheld assist and door frame Has following equipment at home: Quad cane small base, Walker - 2 wheeled, bed side commode, Grab bars, and transport chair, chair alarm, bed alarm, call button for son, insertable bed rails  PLOF: Independent with transfers, Needs assistance with ADLs, Needs assistance with homemaking, Needs assistance with gait, and typically has supervision with transfers  PATIENT GOALS: PT asks pt if she would like to get stronger to which she nods.  Son would like for mom to get back to her normal routine safely.  And to be able to leave her alone and know she is safe and for her to transition to the cane just to be a bit more independent.  OBJECTIVE:   TherEx: -Seated single knee to chest w/ pt having difficulty holding LE so used 8" step under foot to provide stretch w/ forward trunk lean 2x45 seconds each LE, focus on diaphragmatic breathing w/ multimodal cuing and therapist providing return demo for timing.  Progressed to double knee to chest w/ 8" step under BLE w/ difficulty comfortably positioning only completed x30 seconds. -Standing hip flexor  stretch at counter w/ PT assisting pt into position, pt reports right thigh soreness during stretch 2x45 sec  GAIT: Gait pattern: step through pattern, decreased arm swing- Right, decreased arm swing- Left, decreased step length- Left, decreased stance time- Right, decreased stride length, decreased hip/knee flexion- Right, lateral hip instability, and decreased trunk rotation Distance walked: 400' + 345' Assistive device utilized: Environmental consultant - 2 wheeled and None Level of assistance: Modified independence and CGA Comments: Emphasis on timing breathing to pace of activity w/ pt stating "I'm just doing it" when cued to slow down to conserve energy.  When ambulating CGA w/o AD pt cued to improved right stance w/ minimal adjustment.  Trialed visual cue on floor with pt unable to sustain correction likely due to onset of fatigue.  Pt dyspneic throughout task.  NMR: -STS on doubled blue mat w/ single UE support > STS w/ bean bag toss to far midline target progressing from wide BOS to feet together to challenge immediate standing and anticipatory balance -Backwards walking x30' CGA no UE support -Forwards walking eyes closed w/ single UE counter support 4x10' , pt waving saying "done" following task, session concluded.  Handed off to spouse at bathroom w/ update on session and activities completed.  PATIENT EDUCATION: Education details: Continue HEP.   Person educated: Patient and Spouse Education method: Customer service manager Education comprehension: verbalized understanding, returned demonstration, and needs further education  HOME EXERCISE PROGRAM: Added marching and SLS to HEP Access Code: [email or web address] URL: [email or web address] Date: 12/25/2022 Prepared by: Elease Etienne  Exercises - Side Stepping with Resistance at Thighs and Counter Support  - 1 x daily - 5 x weekly - 3 sets - 10 reps - Sit to Stand with Resistance Around Legs  - 1 x daily - 5 x weekly - 2 sets - 10  reps - Seated Hamstring Stretch  - 1 x daily - 5 x weekly - 1 sets - 2-3 reps - 45 seconds hold - Long Sitting Calf Stretch with Strap  - 1 x daily - 5 x weekly - 1 sets - 2-3 reps - 45 seconds hold  Marching In-Place - HOLD ONTO COUNTER FOR SUPPORT    Standing straight, alternate bringing knees toward trunk. Arms swing alternately. Do __1_ sets. Do _1__ times per day.  10 REPS EACH LEG     SINGLE LIMB STANCE - HOLD ONTO COUNTER FOR SUPPORT   Stance: single leg on floor. Raise leg. Hold _10__ seconds. Repeat with other leg. ___1-2 reps per set, __1_ sets per day, __5_ days per week  Copyright  VHI. All rights reserved.   GOALS: Goals reviewed with patient? Yes  SHORT TERM GOALS: Target date: 01/03/2023  Pt will perform strength, stretching, and balance HEP with supervision from family to maintain progress. Baseline:  To be established. Goal status: INITIAL  2.  BERG to be assessed w/ STG/LTG set as appropriate.  Increase Berg score to >/ 42/56 to demo improvement in balance. Baseline: To be assessed. Goal status: INITIAL  3.  Pt will decrease 5xSTS to </=13 seconds w/ BUE support in order to demonstrate decreased risk for falls and improved functional bilateral LE strength and power. Baseline: 16.86 sec w/ BUE support Goal status: INITIAL  4.  Pt will demonstrate a gait speed of >/=2.5 feet/sec using LRAD in order to decrease risk for falls. Baseline: 2.08 ft/sec w/ RW Goal status: INITIAL  5.  Pt will negotiation 2 stairs w/o rail using LRAD at no more than supervision level in order to improve safe accessibility to home environment. Baseline: Pt needs minA at home to access home via garage or front door. Goal status: INITIAL  LONG TERM GOALS: Target date: 01/31/2023  FOTO to be assessed w/ LTG set. Baseline: To be assessed. Goal status: INITIAL  2.  BERG to be assessed w/ STG/LTG set as appropriate.  Increase Berg score to >/= 47/56 for reduced fall risk.   Baseline: 37/56 on 12-10-22 Goal status: INITIAL  3.  Pt will decrease 5xSTS to </=13 seconds w/o BUE support in order to demonstrate decreased risk for falls and improved functional bilateral LE strength and power. Baseline: 16.86 sec w/ BUE support Goal status: INITIAL  4.  Pt will demonstrate a gait speed of >/=2.98 feet/sec using LRAD in order to decrease risk for falls. Baseline: 2.08 ft/sec w/ RW Goal status: INITIAL  5.  Pt will ambulate >/=500' over level indoor surfaces at modI level to improve endurance and safety with household mobility. Baseline: 125' w/ RW SBA Goal status: INITIAL  ASSESSMENT:  CLINICAL IMPRESSION: Able to address hip stretching this session with good response from patient.  She remains limited by significantly decreased activity tolerance, right thigh pain, and difficulty self-pacing activity and breathing.  She is demonstrating good efforts at communicating short phrases to therapist to indicate discomfort and status changes during activities.  She continues to benefit from skilled therapy to promote pain management, safety with functional mobility, and improved aerobic tolerance.  OBJECTIVE IMPAIRMENTS: Abnormal gait, decreased activity tolerance, decreased balance, decreased strength, increased muscle spasms, improper body mechanics, and postural dysfunction.   ACTIVITY LIMITATIONS: carrying, lifting, bending, squatting, stairs, transfers, bed mobility, and locomotion level  PARTICIPATION LIMITATIONS: meal prep, cleaning, laundry, medication management, driving, shopping, and community activity  PERSONAL FACTORS: Age, Fitness, Past/current experiences, Transportation, and 1-2 comorbidities: recent left THA and HTN  are also affecting patient's functional outcome.   REHAB POTENTIAL: Good  CLINICAL DECISION MAKING: Stable/uncomplicated  EVALUATION COMPLEXITY: Low  PLAN:  PT FREQUENCY: 2x/week  PT DURATION: 8 weeks  PLANNED INTERVENTIONS:  Therapeutic exercises, Therapeutic activity, Neuromuscular re-education, Balance training, Gait  training, Patient/Family education, Self Care, Joint mobilization, Stair training, Vestibular training, DME instructions, Manual therapy, and Re-evaluation  PLAN FOR NEXT SESSION:   Right NMR, LLE strengthening, balance training-maybe manual dual task, compliant/unlevel surfaces, dynamic tasks, gait training with no device vs RW; stair training, eyes closed, continue hip stretching in sitting w/ step and standing hip flexor stretch.  Trial heel raises in standing, all endurance tasks and BREATHING   Bary Richard, PT, DPT 12/27/2022, 11:10 AM

## 2022-12-27 NOTE — Therapy (Signed)
OUTPATIENT SPEECH LANGUAGE PATHOLOGY TREATMENT   Patient Name: Kelsey Jacobs MRN: [id number] DOB:1945/12/18, 77 y.o., female 84 Date: 12/27/2022  PCP: Larene Beach MD REFERRING PROVIDER: Gwendolyn Lima, FNP  END OF SESSION:  End of Session - 12/27/22 1050     Visit Number 8    Number of Visits 25    Date for SLP Re-Evaluation 02/19/23    Authorization Type medicare    SLP Start Time 1100    SLP Stop Time  1145    SLP Time Calculation (min) 45 min    Activity Tolerance Patient tolerated treatment well               Past Medical History:  Diagnosis Date   Medical history non-contributory    Past Surgical History:  Procedure Laterality Date   BACK SURGERY     FOOT SURGERY     INCISION AND DRAINAGE ABSCESS Left 03/29/2021   Procedure: INCISION AND DRAINAGE AXILLARY ABSCESS;  Surgeon: Jovita Kussmaul, MD;  Location: [facility];  Service: General;  Laterality: Left;   Patient Active Problem List   Diagnosis Date Noted   Axillary abscess 03/30/2021    ONSET DATE: 11/06/22   REFERRING DIAG: T06.269 (ICD-10-CM) - Arterial ischemic stroke, MCA (middle cerebral artery), left, acute ([place]) Z74.09 (ICD-10-CM) - Other reduced mobility Z78.9 (ICD-10-CM) - Other specified health status I69.320 (ICD-10-CM) - Aphasia following cerebral infarction  THERAPY DIAG:  Aphasia  Rationale for Evaluation and Treatment: Rehabilitation  SUBJECTIVE:   SUBJECTIVE STATEMENT: "Tired"  PAIN:  Are you having pain? No  OBJECTIVE:   TODAY'S TREATMENT:   12-27-22: Pt attends session with Kelsey Jacobs. Report Kelsey Jacobs is becoming more interested in using SGD, will use of own volition at home without prompting from care partners. Target accurate response to personal Y/N questions, with pt demonstrating 90% accuracy given support to simple language, repetition, written key words. SGD set to fast talk page with yes, no but pt did not need. Demonstrated confirmation supportive communication strategy  to Kelsey Jacobs, who was able to teach back strategy and why it is useful with mod-I. Discussion regarding preferences for later today and weekend with SGD to support Kelsey Jacobs's verbal communication. Kelsey Jacobs demonstrating navigation between pages to desired icon with mod-I. Usual attempts to repeat after icon with limited success. Given max multi-modal cueing from SLP is able to approximate ~90% of targets. SLP provides education on reducing overall rate to aid in successful verbal communication- not just at word level but in producing phrases/sentences. Pt with increased accuracy using carrier phrase "I like" + noun when pausing between rote phrase and novel noun.   12-25-22: Targeted multimodal communication using SGD to ID preferences of restaurants and shops. Demonstrated steps to customize device with spouse. Added 4 favorite online shops under "going out" and 2 restaurants ([employer], [employer]) With usual min to mod Kelsey Jacobs, Kelsey Jacobs navigated device to select food then restaurants with cues to correct 2 errors and use to back arrow rather than home button to stay in the same folder when error needs to be corrected. Using SGD, she named 5 family and 2 friends, repeating after device with limited success. She benefitted from choral production, visual aid, syllabification, simple and direct language to approximate names, stores and restaurants. Added hair cut, mani/pedi icons under going out as well.   12-20-22: Target use of SGD to express wants and needs. Pt able to navigate to appropriate page with rare min-Kelsey Jacobs. Pt selects icon and attempts to verbally repeat.  Limited success with verbal repetition, benefits from SLP cues (choral production, visual aid, syllabification, simple and direct language). Identified additional need to add to device; son able to add it independently. Target naming simple category of fruits. Pt generates 0 just given category, 0/2 given semantic cues. Direct confrontation naming pt with 90% accuracy,  though apraxic errors consistently present. Max-Kelsey Jacobs multimodal cueing to approximate target. Relative strength in writing target name.   12-18-22: SLP provided pt with their trial [employer] device and Kelsey Jacobs in setting per pt's preferences. Provided education to son on how to modify device and how family can employ to Kelsey Jacobs in communication and therapy practice. With verbal cues, pt able to demonstrate navigation of device through various folders and select dictated icon with 100% accuracy. Pt's son endorses confidence in ability to modify device, will begin with device customization per pt preferences prior to next session.   12-12-22: Target receptive and expressive language in dynamic language task. SLP had pt sort cards into 2 distinct categories, pt with 90% accuracy given rare min-Kelsey Jacobs. Given semantic cues, pt ID appropriate card in 80% of trials (from [facility]), with errors notable for there being Kelsey Jacobs semantic or phonemic similarity in erroneously selection option. SLP requests pt attempt to name, with pt able to name 20% of targets. Marked apraxia continues with usual groping, irregular and erroneous productions of targets despite max-Kelsey Jacobs from SLP. Some benefit observed from reduced rate and visual cues include mouth placement demonstration, syllabification, and written representation of word. Advised pt's trial device would arrive in time for next session. Provided personal interest form for family to fill out.   12-10-22: Target expression of preferences through employment of low tech multimodal communication. Pt able to relay to SLP level of interest in various activities via 3 selection options (very, somewhat, none) for 24 topics, with pt's non verbal communication and care partners input matching her selection in 100% of trials. Determine dinner preference via binary options. Spouse endroses successful employment of this strategy at home, they find white board very helpful. Target verbal expression through trials of  modified melodic intonation therapy, with pt able to approximate x6/7 fruits given usual modeling, visual and written cues. Visual cues to annotate number of syllables successful in avoiding additional sounds to end of words. Approximations notable for being effortful, inconsistent, and with groping.    PATIENT EDUCATION: Education details: Sees today's treatment and patient instructions, aphasia ed Person educated: Patient, Spouse, and Child(ren) Education method: Explanation, Demonstration, Verbal cues, and Handouts Education comprehension: verbalized understanding, verbal cues required, and needs further education   GOALS: Goals reviewed with patient? Yes  SHORT TERM GOALS: Target date: 12/25/22  Pt will name 5 family members with occasional mod Kelsey Jacobs Baseline: Goal status: MET  2.  Pt will answer personally relevant yes/no questions accurately 85% with multimodal communication Baseline:  Goal status: IN PROGRESS  3.  Pt will ID 5 food and shopping preferences using multimodal communication with occasional min Kelsey Jacobs Baseline:  Goal status: MET  4.  Pt will name 6 items in personally relevant categories using AAC/multimodal communication as needed with occasional mod Kelsey Jacobs Baseline:  Goal status: NOT MET  5.  Family/caregivers will carryover 3 communication strategies to support pt's comprehension and expression Baseline:  Goal status: MET   LONG TERM GOALS: Target date: 02/18/22  Pt will initiate use mulitmodal communication to communicate wants/needs 3x over 1 week with usual min Kelsey Jacobs from caregivers Baseline:  Goal status: IN PROGRESS  2.  Pt or  caregivers will customize 4 items on AAC system with rare min Kelsey Jacobs  Baseline:  Goal status: IN PROGRESS  3.  Pt will use multimodal communication to communicate 3 grocery items and 3 personal care items she needs with occasional min Kelsey Jacobs from family Baseline:  Goal status: IN PROGRESS  4.  Pt will access online shopping site she uses and  purchase Kelsey Jacobs needed item with occasional  min Kelsey Jacobs from family Baseline:  Goal status: IN PROGRESS  5.  Using compensatory strategies and multimodal communication, pt will take 3turns in conversation with occasional min Kelsey Jacobs Baseline:  Goal status: IN PROGRESS  ASSESSMENT:  CLINICAL IMPRESSION: Patient is Kelsey Jacobs 77 y.o. female who was seen today for aphasia. 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 She named 0/6 items, with 2 incidents of phonemic paraphasias, bite/book and beak/book. Other responses were facial expressions of frustration and non verbal. Spontaneous speech is limited to 1-2 words with groping and halting. She read 0/6 items on QAB, again with phonemic paraphasia of pity/pig. Dannette repeated 0/6 items on QAB. She is inconsistent with yes/no's, family is attempting to help her use thumbs up and down for this. She is not using salient gestures consistently. Prior to CVA, Shellee shopped and looked up recipes on line using her computer. They do not have Kelsey Jacobs tablet. She wrote her name, her son's and spouses name. She was not able to write the pets name or other friends/family members. Communication is frustrating for her and her family. I recommend skilled ST to maximize communication for safety, QOL, independence and to reduce caregiver burden. She needs OT and PT as well, family stated they felt this would be too much at once  OBJECTIVE IMPAIRMENTS: include attention, memory, aphasia, and apraxia. These impairments are limiting patient from return to work, managing medications, managing appointments, managing finances, household responsibilities, ADLs/IADLs, and effectively communicating at home and in community. Factors affecting potential to achieve goals and functional outcome are severity of impairments. Patient will benefit from skilled SLP services to  address above impairments and improve overall function.  REHAB POTENTIAL: Good  PLAN:  SLP FREQUENCY: 2x/week  SLP DURATION: 12 weeks  PLANNED INTERVENTIONS: Language facilitation, Environmental controls, Cueing hierachy, Cognitive reorganization, Internal/external aids, Functional tasks, Multimodal communication approach, SLP instruction and feedback, Compensatory strategies, and Patient/family education    Maia Breslow, CCC-SLP 12/27/2022, 11:00 AM

## 2022-12-30 ENCOUNTER — Ambulatory Visit: Payer: Medicare Other | Admitting: Occupational Therapy

## 2022-12-30 ENCOUNTER — Encounter: Payer: Self-pay | Admitting: Occupational Therapy

## 2022-12-30 ENCOUNTER — Ambulatory Visit: Payer: Medicare Other | Admitting: Physical Therapy

## 2022-12-30 ENCOUNTER — Encounter: Payer: Self-pay | Admitting: Physical Therapy

## 2022-12-30 ENCOUNTER — Ambulatory Visit: Payer: Medicare Other | Admitting: Speech Pathology

## 2022-12-30 DIAGNOSIS — R2681 Unsteadiness on feet: Secondary | ICD-10-CM

## 2022-12-30 DIAGNOSIS — R41842 Visuospatial deficit: Secondary | ICD-10-CM

## 2022-12-30 DIAGNOSIS — M6281 Muscle weakness (generalized): Secondary | ICD-10-CM

## 2022-12-30 DIAGNOSIS — R4701 Aphasia: Secondary | ICD-10-CM | POA: Diagnosis not present

## 2022-12-30 DIAGNOSIS — I69351 Hemiplegia and hemiparesis following cerebral infarction affecting right dominant side: Secondary | ICD-10-CM

## 2022-12-30 DIAGNOSIS — R41840 Attention and concentration deficit: Secondary | ICD-10-CM

## 2022-12-30 DIAGNOSIS — R2689 Other abnormalities of gait and mobility: Secondary | ICD-10-CM

## 2022-12-30 NOTE — Therapy (Signed)
OUTPATIENT SPEECH LANGUAGE PATHOLOGY TREATMENT   Patient Name: Kelsey Jacobs MRN: [id number] DOB:02-06-1946, 77 y.o., female 86 Date: 12/30/2022  PCP: Larene Beach MD REFERRING PROVIDER: Gwendolyn Lima, FNP  END OF SESSION:  End of Session - 12/30/22 1526     Visit Number 9    Number of Visits 25    Date for SLP Re-Evaluation 02/19/23    Authorization Type medicare    SLP Start Time 1315    SLP Stop Time  1400    SLP Time Calculation (min) 45 min    Activity Tolerance Patient tolerated treatment well                Past Medical History:  Diagnosis Date   Medical history non-contributory    Past Surgical History:  Procedure Laterality Date   BACK SURGERY     FOOT SURGERY     INCISION AND DRAINAGE ABSCESS Left 03/29/2021   Procedure: INCISION AND DRAINAGE AXILLARY ABSCESS;  Surgeon: Jovita Kussmaul, MD;  Location: [facility];  Service: General;  Laterality: Left;   Patient Active Problem List   Diagnosis Date Noted   Axillary abscess 03/30/2021    ONSET DATE: 11/06/22   REFERRING DIAG: Z02.585 (ICD-10-CM) - Arterial ischemic stroke, MCA (middle cerebral artery), left, acute ([place]) Z74.09 (ICD-10-CM) - Other reduced mobility Z78.9 (ICD-10-CM) - Other specified health status I69.320 (ICD-10-CM) - Aphasia following cerebral infarction  THERAPY DIAG:  Aphasia  Rationale for Evaluation and Treatment: Rehabilitation  SUBJECTIVE:   SUBJECTIVE STATEMENT: "It's hard" ID:[id number] and balance  PAIN:  Are you having pain? No  OBJECTIVE:   TODAY'S TREATMENT:    12-30-22: Kelsey Jacobs continues to engage with SGD independently at home for practice. Ongoing instruction to spouse that he and their son will also have to use SGD with Kelsey Jacobs to promote her communication by offering choices and helping her navigate to the topic they need to communicate with her. Demonstrated back up of SGD using name: Kelsey Jacobs. Trained spouse in adding photos of family and friends  rather than generic icons. Targeted verbal apraxia with use of SGD images, carrier phrase "I like..."  and "I shop    .Marland Kitchen..Marland Kitchen"with consistent max A, [name] approximated 6/10 sentences. Using SGD as a speech model, Kelsey Jacobs named 4 family and 3 friends. Again used carrier phrase "How is......" and the names of 3 of her friends children to approximate the social question 4/6x with max A.    12-27-22: Pt attends session with Kelsey Jacobs. Report Kelsey Jacobs is becoming more interested in using SGD, will use of own volition at home without prompting from care partners. Target accurate response to personal Y/N questions, with pt demonstrating 90% accuracy given support to simple language, repetition, written key words. SGD set to fast talk page with yes, no but pt did not need. Demonstrated confirmation supportive communication strategy to Kelsey Jacobs, who was able to teach back strategy and why it is useful with mod-I. Discussion regarding preferences for later today and weekend with SGD to support Kelsey Jacobs's verbal communication. Kelsey Jacobs demonstrating navigation between pages to desired icon with mod-I. Usual attempts to repeat after icon with limited success. Given max multi-modal cueing from SLP is able to approximate ~90% of targets. SLP provides education on reducing overall rate to aid in successful verbal communication- not just at word level but in producing phrases/sentences. Pt with increased accuracy using carrier phrase "I like" + noun when pausing between rote phrase and novel noun.   12-25-22: Targeted multimodal communication using  SGD to ID preferences of restaurants and shops. Demonstrated steps to customize device with spouse. Added 4 favorite online shops under "going out" and 2 restaurants ([employer], [employer]) With usual min to mod A, Kelsey Jacobs navigated device to select food then restaurants with cues to correct 2 errors and use to back arrow rather than home button to stay in the same folder when error needs to be corrected. Using  SGD, she named 5 family and 2 friends, repeating after device with limited success. She benefitted from choral production, visual aid, syllabification, simple and direct language to approximate names, stores and restaurants. Added hair cut, mani/pedi icons under going out as well.   12-20-22: Target use of SGD to express wants and needs. Pt able to navigate to appropriate page with rare min-A. Pt selects icon and attempts to verbally repeat. Limited success with verbal repetition, benefits from SLP cues (choral production, visual aid, syllabification, simple and direct language). Identified additional need to add to device; son able to add it independently. Target naming simple category of fruits. Pt generates 0 just given category, 0/2 given semantic cues. Direct confrontation naming pt with 90% accuracy, though apraxic errors consistently present. Max-A multimodal cueing to approximate target. Relative strength in writing target name.   12-18-22: SLP provided pt with their trial Lingraphica device and A in setting per pt's preferences. Provided education to son on how to modify device and how family can employ to A in communication and therapy practice. With verbal cues, pt able to demonstrate navigation of device through various folders and select dictated icon with 100% accuracy. Pt's son endorses confidence in ability to modify device, will begin with device customization per pt preferences prior to next session.   12-12-22: Target receptive and expressive language in dynamic language task. SLP had pt sort cards into 2 distinct categories, pt with 90% accuracy given rare min-A. Given semantic cues, pt ID appropriate card in 80% of trials (from [facility]), with errors notable for there being a semantic or phonemic similarity in erroneously selection option. SLP requests pt attempt to name, with pt able to name 20% of targets. Marked apraxia continues with usual groping, irregular and erroneous productions of targets  despite max-A from SLP. Some benefit observed from reduced rate and visual cues include mouth placement demonstration, syllabification, and written representation of word. Advised pt's trial device would arrive in time for next session. Provided personal interest form for family to fill out.   12-10-22: Target expression of preferences through employment of low tech multimodal communication. Pt able to relay to SLP level of interest in various activities via 3 selection options (very, somewhat, none) for 24 topics, with pt's non verbal communication and care partners input matching her selection in 100% of trials. Determine dinner preference via binary options. Spouse endroses successful employment of this strategy at home, they find white board very helpful. Target verbal expression through trials of modified melodic intonation therapy, with pt able to approximate x6/7 fruits given usual modeling, visual and written cues. Visual cues to annotate number of syllables successful in avoiding additional sounds to end of words. Approximations notable for being effortful, inconsistent, and with groping.    PATIENT EDUCATION: Education details: Sees today's treatment and patient instructions, aphasia ed Person educated: Patient, Spouse, and Child(ren) Education method: Explanation, Demonstration, Verbal cues, and Handouts Education comprehension: verbalized understanding, verbal cues required, and needs further education   GOALS: Goals reviewed with patient? Yes  SHORT TERM GOALS: Target date: 12/25/22  Pt will name 5 family members with occasional mod A Baseline: Goal status: MET  2.  Pt will answer personally relevant yes/no questions accurately 85% with multimodal communication Baseline:  Goal status: MET  3.  Pt will ID 5 food and shopping preferences using multimodal communication with occasional min A Baseline:  Goal status: MET  4.  Pt will name 6 items in personally relevant categories  using AAC/multimodal communication as needed with occasional mod A Baseline:  Goal status: NOT MET  5.  Family/caregivers will carryover 3 communication strategies to support pt's comprehension and expression Baseline:  Goal status: MET   LONG TERM GOALS: Target date: 02/18/22  Pt will initiate use mulitmodal communication to communicate wants/needs 3x over 1 week with usual min A from caregivers Baseline:  Goal status: IN PROGRESS  2.  Pt or caregivers will customize 4 items on AAC system with rare min A  Baseline:  Goal status: IN PROGRESS  3.  Pt will use multimodal communication to communicate 3 grocery items and 3 personal care items she needs with occasional min A from family Baseline:  Goal status: IN PROGRESS  4.  Pt will access online shopping site she uses and purchase a needed item with occasional  min A from family Baseline:  Goal status: IN PROGRESS  5.  Using compensatory strategies and multimodal communication, pt will take 3turns in conversation with occasional min A Baseline:  Goal status: IN PROGRESS  ASSESSMENT:  CLINICAL IMPRESSION: Patient is a 77 y.o. female who was seen today for aphasia. 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 She named 0/6 items, with 2 incidents of phonemic paraphasias, bite/book and beak/book. Other responses were facial expressions of frustration and non verbal. Spontaneous speech is limited to 1-2 words with groping and halting. She read 0/6 items on QAB, again with phonemic paraphasia of pity/pig. Naziyah repeated 0/6 items on QAB. She is inconsistent with yes/no's, family is attempting to help her use thumbs up and down for this. She is not using salient gestures consistently. Prior to CVA, [place] shopped and looked up recipes on line using her computer. They do not have a tablet. She  wrote her name, her son's and spouses name. She was not able to write the pets name or other friends/family members. Communication is frustrating for her and her family. I recommend skilled ST to maximize communication for safety, QOL, independence and to reduce caregiver burden. She needs OT and PT as well, family stated they felt this would be too much at once  OBJECTIVE IMPAIRMENTS: include attention, memory, aphasia, and apraxia. These impairments are limiting patient from return to work, managing medications, managing appointments, managing finances, household responsibilities, ADLs/IADLs, and effectively communicating at home and in community. Factors affecting potential to achieve goals and functional outcome are severity of impairments. Patient will benefit from skilled SLP services to address above impairments and improve overall function.  REHAB POTENTIAL: Good  PLAN:  SLP FREQUENCY: 2x/week  SLP DURATION: 12 weeks  PLANNED INTERVENTIONS: Language facilitation, Environmental controls, Cueing hierachy, Cognitive reorganization, Internal/external aids, Functional tasks, Multimodal communication approach, SLP instruction and feedback, Compensatory strategies, and Patient/family education    Kelsie, Zaborowski, CCC-SLP 12/30/2022, 3:26 PM

## 2022-12-30 NOTE — Patient Instructions (Addendum)
   On device, back up to cloud  Name: Kelsey Jacobs  for back up  How are you?  I'm fine.

## 2022-12-30 NOTE — Therapy (Signed)
OUTPATIENT OCCUPATIONAL THERAPY NEURO TREATMENT  Patient Name: Kelsey Jacobs MRN: [id number] DOB:[birth year], 77 y.o., female Today's Date: 12/30/2022  PCP: Dr. Vanessa Barbara  REFERRING PROVIDER: Levada Schilling, MD  END OF SESSION:  OT End of Session - 12/30/22 1548     Visit Number 5    Number of Visits 17    Date for OT Re-Evaluation 02/16/23    Authorization Type MCR, [employer]HC    Progress Note Due on Visit 10    OT Start Time 1447    OT Stop Time 1529    OT Time Calculation (min) 42 min    Activity Tolerance Patient tolerated treatment well    Behavior During Therapy WFL for tasks assessed/performed             Past Medical History:  Diagnosis Date   Medical history non-contributory    Past Surgical History:  Procedure Laterality Date   BACK SURGERY     FOOT SURGERY     INCISION AND DRAINAGE ABSCESS Left 03/29/2021   Procedure: INCISION AND DRAINAGE AXILLARY ABSCESS;  Surgeon: Jovita Kussmaul, MD;  Location: [facility];  Service: General;  Laterality: Left;   Patient Active Problem List   Diagnosis Date Noted   Axillary abscess 03/30/2021    ONSET DATE: 12/04/2022 referral date  REFERRING DIAG:  Diagnosis  I63.512 (ICD-10-CM) - Cerebral infarction due to unspecified occlusion or stenosis of left middle cerebral artery  Z74.09 (ICD-10-CM) - Other reduced mobility  Z78.9 (ICD-10-CM) - Other specified health status  I69.320 (ICD-10-CM) - Aphasia following cerebral infarction    THERAPY DIAG:  Muscle weakness (generalized)  Hemiplegia and hemiparesis following cerebral infarction affecting right dominant side (HCC)  Visuospatial deficit  Attention and concentration deficit  Rationale for Evaluation and Treatment: Rehabilitation  SUBJECTIVE:   SUBJECTIVE STATEMENT: She has been using a Sal[place] patch on her R thigh. She had a cramp in her R ankle last night. She also reports she has a headache. Her cane exercises are still a challenge but LB dressing and  breathing have gotten better.   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  4/10 RT thigh - O.T. not addressing  FALLS: Has patient fallen in last 6 months? No  LIVING ENVIRONMENT: Lives with: lives with their spouse and lives with their son-son will be leaving eventually, but is there indefinitely  Lives in: House/apartment (pt lives on first floor)  Stairs: Yes: Internal: 15 steps; on left going up and External: 2 steps; uses handheld assist and door frame Has following equipment at home: Quad cane small base, Walker - 2 wheeled, bed side commode, Grab bars, and transport chair, chair alarm, bed alarm, call button for son, insertable bed rails   PLOF: Independent with transfers, Needs assistance with ADLs, Needs assistance with homemaking, Needs assistance with gait, and typically has supervision with transfers   PATIENT GOALS: PT asks pt if she would like to get stronger to which she nods.  Son would like for mom to get back to her normal routine safely.  And to be able to leave her alone and know she is safe and for her to transition to the cane just to be a bit more independent.    OBJECTIVE: (from evaluation unless otherwise noted)  HAND DOMINANCE: Right  ADLs:  Overall ADLs: mostly supervision/cueing, min assist at times Transfers/ambulation related to ADLs: Eating: mod I  Grooming: mod I  UB Dressing: mod I  LB Dressing: min assist to don Lt foot in pants and occasionally Lt sock, slip on shoes Toileting: mod I (occasional assist for perineal care w/ BM)  Bathing: set up, cues for thoroughness Tub Shower transfers: supervision/cues Equipment:  BSC for shower seat, grab bars  IADLs: Shopping: husband has done for several years Light housekeeping:  dependent, has cleaning lady for heavier cleaning 1x/month Meal Prep: dependent at this time Community mobility: not currently driving - relying on family Medication management: husband administering Financial management: son has been doing Handwriting: 100% legible and print, name only  MOBILITY STATUS:  uses walker   UPPER EXTREMITY ROM:  BUE AROM WNLs   UPPER EXTREMITY MMT:   RUE grossly 4/5, LUE MMT grossly 5/5   HAND FUNCTION: Grip strength: Right: 38.1 lbs; Left: 33.0 lbs  COORDINATION: 9 Hole Peg test: Right: 28 sec; Left: 26 sec  SENSATION: Slightly dulled RUE, pt appears to detect stimuli and localization  EDEMA: none   COGNITION: Overall cognitive status: Difficulty to assess due to: Communication impairment (aphasia). Pt can follow 1 step visual cues/demo  VISION: Subjective report: bluriness since stroke Baseline vision: Wears glasses all the time Visual history: corrective eye surgery  VISION ASSESSMENT: Not tested D/t aphasia   PERCEPTION: Not tested  PRAXIS: Not tested  OBSERVATIONS: Pt mostly limited by Lemmie Evens, overall needs sup/min assist for BADLS   TODAY'S TREATMENT:                                                                                                                              - Therapeutic activities completed for duration as noted below including:  Patient participated in 1 game of War with therapist requiring min cueing for proper play, no cues to only use affected R hand with manipulation of pieces, for fine motor coordination, item recognition, and comprehension/strategy following CVA. 1 lb wrist weight placed on LUE for additional endurance challenge.   - Self-care/home management completed for duration as noted below including: OT educated pt and spouse on scanning techniques as noted in pt instructions. Due to receptive aphasia and impaired reading, pt will require additional education though was able to demonstrate  scanning technique.   Therapist reviewed goals with patient and updated patient progression.    PATIENT EDUCATION: Education details: Programme researcher, broadcasting/film/video Person educated: Patient and Spouse Education method: Explanation, Demonstration, Tactile cues, and Verbal cues Education comprehension: verbalized understanding, returned demonstration, verbal cues required, tactile cues required, and needs further education  HOME EXERCISE PROGRAM: 1/12 - dowel HEP   GOALS: Goals reviewed with patient? Yes  SHORT TERM GOALS: Target date: 01/18/23  Independent with HEP for RUE strengthening Baseline: Goal status: IN PROGRESS  2.  Pt to perform LE dressing (donning pants and Lt sock) w/ AE and no more than min cues Baseline:  Goal status: MET  3.  Pt to prepare simple snack, sandwich, or microwaveable items with min cues and direct sup Baseline:  Goal status: IN PROGRESS  4.  Pt to perform bathing and perineal care with no more than min cues/sup Baseline:  Goal status: IN PROGRESS   LONG TERM GOALS: Target date: 02/16/23  Independent with all BADLS Baseline:  Goal status: IN PROGRESS  2.  Pt to perform familiar stovetop meal with direct supervision and min cues prn Baseline:  Goal status: INITIAL  3.  Pt to consistently perform light IADLS including washing dishes and laundry Baseline:  Goal status: IN PROGRESS   ASSESSMENT:  CLINICAL IMPRESSION: Pt is good candidate for skilled OT services following CVA to improve independence and safety with ADLs and IADLs.   PERFORMANCE DEFICITS: in functional skills including ADLs, IADLs, coordination, dexterity, sensation, strength, mobility, body mechanics, decreased knowledge of use of DME, and UE functional use, cognitive skills including attention and safety awareness, and psychosocial skills including coping strategies.   IMPAIRMENTS: are limiting patient from ADLs, IADLs, leisure, and social participation.   CO-MORBIDITIES: may  have co-morbidities  that affects occupational performance. Patient will benefit from skilled OT to address above impairments and improve overall function.  REHAB POTENTIAL: Good  PLAN:  OT FREQUENCY: 2x/week  OT DURATION: 8 weeks, plus evaluation (may only need 6 weeks)   PLANNED INTERVENTIONS: self care/ADL training, therapeutic exercise, therapeutic activity, neuromuscular re-education, balance training, functional mobility training, moist heat, patient/family education, cognitive remediation/compensation, visual/perceptual remediation/compensation, coping strategies training, and DME and/or AE instructions  RECOMMENDED OTHER SERVICES: none at this time  CONSULTED AND AGREED WITH PLAN OF CARE: Patient and family member/caregiver  PLAN FOR NEXT SESSION:  progress to strengthening as tolerated; env scanning review; ADL completion   Dennis Bast, OT 12/30/2022, 3:50 PM

## 2022-12-30 NOTE — Therapy (Signed)
OUTPATIENT PHYSICAL THERAPY NEURO TREATMENT NOTE   Patient Name: LOGEN FOWLE MRN: [id number] DOB:1946-03-30, 77 y.o., female Today's Date: 12/30/2022   PCP: Dr. Michell Heinrich [facility] Internal Med at [facility]) [place] PROVIDER: Levada Schilling, MD  END OF SESSION:  PT End of Session - 12/30/22 1410     Visit Number 8    Number of Visits 17   16+eval   Date for PT Re-Evaluation 02/07/23   pushed out due to potential scheduling delay   Authorization Type MEDICARE PART A AND B    Progress Note Due on Visit 10    PT Start Time 1405    PT Stop Time 1445    PT Time Calculation (min) 40 min    Equipment Utilized During Treatment Gait belt    Activity Tolerance Patient tolerated treatment well    Behavior During Therapy WFL for tasks assessed/performed               Past Medical History:  Diagnosis Date   Medical history non-contributory    Past Surgical History:  Procedure Laterality Date   BACK SURGERY     FOOT SURGERY     INCISION AND DRAINAGE ABSCESS Left 03/29/2021   Procedure: INCISION AND DRAINAGE AXILLARY ABSCESS;  Surgeon: Jovita Kussmaul, MD;  Location: [facility];  Service: General;  Laterality: Left;   Patient Active Problem List   Diagnosis Date Noted   Axillary abscess 03/30/2021    ONSET DATE: 11/28/2022  REFERRING DIAG: R26.9 (ICD-10-CM) - Gait abnormality  THERAPY DIAG:  Muscle weakness (generalized)  Hemiplegia and hemiparesis following cerebral infarction affecting right dominant side ([place])  Other abnormalities of gait and mobility  Unsteadiness on feet  Rationale for Evaluation and Treatment: Rehabilitation  SUBJECTIVE:                                                                                                                                                                                             SUBJECTIVE STATEMENT:   Pt presents with husband this visit who denies falls and says they have been working on stretches.  He further  states son placed pain patch on right thigh and patient has responded well to this.  He states she continues to endorse pain in right thigh and had a left calf cramp for the first time last night.    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 Are you having pain? Yes: NPRS scale: "little bit"/10 Pain location: top of right thigh Pain description:  sore Aggravating factors: pt unsure Relieving factors: pt unsure  PRECAUTIONS: Fall-anterior hip precautions have been lifted  WEIGHT BEARING RESTRICTIONS: No  FALLS: Has patient fallen in last 6 months? No  LIVING ENVIRONMENT: Lives with: lives with their spouse and lives with their son-son will be leaving eventually, but is there indefinitely  Lives in: House/apartment Stairs: Yes: Internal: 15 steps; on left going up and External: 2 steps; uses handheld assist and door frame Has following equipment at home: Quad cane small base, Walker - 2 wheeled, bed side commode, Grab bars, and transport chair, chair alarm, bed alarm, call button for son, insertable bed rails  PLOF: Independent with transfers, Needs assistance with ADLs, Needs assistance with homemaking, Needs assistance with gait, and typically has supervision with transfers  PATIENT GOALS: PT asks pt if she would like to get stronger to which she nods.  Son would like for mom to get back to her normal routine safely.  And to be able to leave her alone and know she is safe and for her to transition to the cane just to be a bit more independent.  OBJECTIVE:   TherEx: -Seated single knee to chest w/ pt having difficulty holding LE so used 10" step under foot to provide stretch w/ forward trunk lean x45 seconds each LE, focus on diaphragmatic breathing w/ multimodal cuing and therapist providing return demo for timing.   -Standing hip flexor  stretch at counter w/ PT assisting pt into position, pt reports right thigh soreness during stretch 2x45 sec -Standing bent knee kickbacks x10 each LE -Mini squats w/ counter support x10, return demo, pt denies knee pain -Standing calf raises at countertop x12  GAIT: Gait pattern: step through pattern, decreased arm swing- Right, decreased arm swing- Left, decreased step length- Left, decreased stance time- Right, decreased stride length, decreased hip/knee flexion- Right, lateral hip instability, and decreased trunk rotation Distance walked: 575' Assistive device utilized: Environmental consultant - 2 wheeled Level of assistance: Modified independence and CGA Comments: Emphasis on timing breathing to pace of activity.  Pt dyspneic during last lap.  Cued to navigate around left oriented obstacles and for left scanning as pt repeated clips stools located on the left side.  SpO2 99% following task.  STAIRS:  Level of Assistance: SBA  Stair Negotiation Technique: Step to Pattern Forwards with Bilateral Rails  Number of Stairs: 4x2   Height of Stairs: 6"  Comments: Edu to pt and husband on "up with the good and down with the bad".  Pt intermittently uses left LE leading w/ increased trunk rotation on descent.  PATIENT EDUCATION: Education details: Additions to HEP and modifications of reps based on pain and fatigue.   Person educated: Patient and Spouse Education method: Customer service manager Education comprehension: verbalized understanding, returned demonstration, and needs further education  HOME EXERCISE PROGRAM: Added marching and SLS to HEP Access Code: [email or web address] URL: [email or web address] Date: 12/30/2022 Prepared by: Elease Etienne  Exercises - Side Stepping with Resistance at Thighs and Counter Support  - 1 x daily - 5 x weekly - 3 sets - 10 reps - Sit to Stand with Resistance Around Legs  - 1 x daily - 5 x weekly - 2 sets - 10 reps - Seated Hamstring Stretch  - 1 x  daily - 5 x weekly - 1 sets - 2-3 reps - 45 seconds hold - Long Sitting Calf Stretch with Strap  - 1 x daily - 5 x weekly - 1 sets - 2-3 reps -  45 seconds hold - Seated Single Knee to Chest  - 1 x daily - 5 x weekly - 1 sets - 2-3 reps - 45 seconds hold - Standing Hip Flexor Stretch  - 1 x daily - 5 x weekly - 1 sets - 2-3 reps - 45 seconds hold - Standing Hip Extension with Leg Bent and Support  - 1 x daily - 5 x weekly - 1 sets - 10 reps   Marching In-Place - HOLD ONTO COUNTER FOR SUPPORT    Standing straight, alternate bringing knees toward trunk. Arms swing alternately. Do __1_ sets. Do _1__ times per day.  10 REPS EACH LEG     SINGLE LIMB STANCE - HOLD ONTO COUNTER FOR SUPPORT   Stance: single leg on floor. Raise leg. Hold _10__ seconds. Repeat with other leg. ___1-2 reps per set, __1_ sets per day, __5_ days per week  Copyright  VHI. All rights reserved.   GOALS: Goals reviewed with patient? Yes  SHORT TERM GOALS: Target date: 01/03/2023  Pt will perform strength, stretching, and balance HEP with supervision from family to maintain progress. Baseline:  To be established. Goal status: INITIAL  2.  BERG to be assessed w/ STG/LTG set as appropriate.  Increase Berg score to >/ 42/56 to demo improvement in balance. Baseline: To be assessed. Goal status: INITIAL  3.  Pt will decrease 5xSTS to </=13 seconds w/ BUE support in order to demonstrate decreased risk for falls and improved functional bilateral LE strength and power. Baseline: 16.86 sec w/ BUE support Goal status: INITIAL  4.  Pt will demonstrate a gait speed of >/=2.5 feet/sec using LRAD in order to decrease risk for falls. Baseline: 2.08 ft/sec w/ RW Goal status: INITIAL  5.  Pt will negotiation 2 stairs w/o rail using LRAD at no more than supervision level in order to improve safe accessibility to home environment. Baseline: Pt needs minA at home to access home via garage or front door. Goal status:  INITIAL  LONG TERM GOALS: Target date: 01/31/2023  FOTO to be assessed w/ LTG set. Baseline: To be assessed. Goal status: INITIAL  2.  BERG to be assessed w/ STG/LTG set as appropriate.  Increase Berg score to >/= 47/56 for reduced fall risk.  Baseline: 37/56 on 12-10-22 Goal status: INITIAL  3.  Pt will decrease 5xSTS to </=13 seconds w/o BUE support in order to demonstrate decreased risk for falls and improved functional bilateral LE strength and power. Baseline: 16.86 sec w/ BUE support Goal status: INITIAL  4.  Pt will demonstrate a gait speed of >/=2.98 feet/sec using LRAD in order to decrease risk for falls. Baseline: 2.08 ft/sec w/ RW Goal status: INITIAL  5.  Pt will ambulate >/=500' over level indoor surfaces at modI level to improve endurance and safety with household mobility. Baseline: 125' w/ RW SBA Goal status: INITIAL  ASSESSMENT:  CLINICAL IMPRESSION: Made additions to HEP to address hip tightness and glute strength as these are likely contributing to patient's ongoing discomfort especially in the right lower extremity.  She is able to ambulate farther this visit maintaining good oxygen saturation despite dyspnea.  She continues to benefit from skilled therapy for education on breathing and efficiency of movement, endurance, and pain management to improve functional mobility.  OBJECTIVE IMPAIRMENTS: Abnormal gait, decreased activity tolerance, decreased balance, decreased strength, increased muscle spasms, improper body mechanics, and postural dysfunction.   ACTIVITY LIMITATIONS: carrying, lifting, bending, squatting, stairs, transfers, bed mobility, and locomotion level  PARTICIPATION  LIMITATIONS: meal prep, cleaning, laundry, medication management, driving, shopping, and community activity  PERSONAL FACTORS: Age, Fitness, Past/current experiences, Transportation, and 1-2 comorbidities: recent left THA and HTN  are also affecting patient's functional outcome.    REHAB POTENTIAL: Good  CLINICAL DECISION MAKING: Stable/uncomplicated  EVALUATION COMPLEXITY: Low  PLAN:  PT FREQUENCY: 2x/week  PT DURATION: 8 weeks  PLANNED INTERVENTIONS: Therapeutic exercises, Therapeutic activity, Neuromuscular re-education, Balance training, Gait training, Patient/Family education, Self Care, Joint mobilization, Stair training, Vestibular training, DME instructions, Manual therapy, and Re-evaluation  PLAN FOR NEXT SESSION:   Right NMR, LLE strengthening, balance training-maybe manual dual task, compliant/unlevel surfaces, dynamic tasks, gait training with no device vs RW; stair training, eyes closed, all endurance tasks and BREATHING, ASSESS STGS!   Bary Richard, PT, DPT 12/30/2022, 2:46 PM

## 2022-12-30 NOTE — Patient Instructions (Signed)
-  Seated Single Knee to Chest  - 1 x daily - 5 x weekly - 1 sets - 2-3 reps - 45 seconds hold - Standing Hip Flexor Stretch  - 1 x daily - 5 x weekly - 1 sets - 2-3 reps - 45 seconds hold - Standing Hip Extension with Leg Bent and Support  - 1 x daily - 5 x weekly - 1 sets - 10 reps

## 2023-01-01 ENCOUNTER — Ambulatory Visit: Payer: Medicare Other | Admitting: Physical Therapy

## 2023-01-01 ENCOUNTER — Ambulatory Visit: Payer: Medicare Other | Admitting: Occupational Therapy

## 2023-01-01 ENCOUNTER — Encounter: Payer: Self-pay | Admitting: Physical Therapy

## 2023-01-01 ENCOUNTER — Ambulatory Visit: Payer: Medicare Other | Admitting: Speech Pathology

## 2023-01-01 DIAGNOSIS — I69351 Hemiplegia and hemiparesis following cerebral infarction affecting right dominant side: Secondary | ICD-10-CM

## 2023-01-01 DIAGNOSIS — R4701 Aphasia: Secondary | ICD-10-CM | POA: Diagnosis not present

## 2023-01-01 DIAGNOSIS — R2681 Unsteadiness on feet: Secondary | ICD-10-CM

## 2023-01-01 DIAGNOSIS — M6281 Muscle weakness (generalized): Secondary | ICD-10-CM

## 2023-01-01 DIAGNOSIS — R2689 Other abnormalities of gait and mobility: Secondary | ICD-10-CM

## 2023-01-01 NOTE — Therapy (Unsigned)
OUTPATIENT PHYSICAL THERAPY NEURO TREATMENT NOTE   Patient Name: Kelsey Jacobs MRN: [id number] DOB:[birth year], 77 y.o., female Today's Date: 01/02/2023   PCP: Dr. Jaye Beagle [facility] Internal Med at [facility]) REFERRING PROVIDER: Renato Gails, MD  END OF SESSION:  PT End of Session - 01/01/23 1110     Visit Number 9    Number of Visits 17   16+eval   Date for PT Re-Evaluation 02/07/23   pushed out due to potential scheduling delay   Authorization Type MEDICARE PART A AND B    Progress Note Due on Visit 10    PT Start Time 1105    PT Stop Time 1147    PT Time Calculation (min) 42 min    Equipment Utilized During Treatment Gait belt    Activity Tolerance Patient tolerated treatment well    Behavior During Therapy WFL for tasks assessed/performed               Past Medical History:  Diagnosis Date   Medical history non-contributory    Past Surgical History:  Procedure Laterality Date   BACK SURGERY     FOOT SURGERY     INCISION AND DRAINAGE ABSCESS Left 03/29/2021   Procedure: INCISION AND DRAINAGE AXILLARY ABSCESS;  Surgeon: Griselda Miner, MD;  Location: [facility];  Service: General;  Laterality: Left;   Patient Active Problem List   Diagnosis Date Noted   Axillary abscess 03/30/2021    ONSET DATE: 11/28/2022  REFERRING DIAG: R26.9 (ICD-10-CM) - Gait abnormality  THERAPY DIAG:  Muscle weakness (generalized)  Hemiplegia and hemiparesis following cerebral infarction affecting right dominant side ([facility])  Other abnormalities of gait and mobility  Unsteadiness on feet  Rationale for Evaluation and Treatment: Rehabilitation  SUBJECTIVE:                                                                                                                                                                                             SUBJECTIVE STATEMENT:   Pt presents with son this visit who denies falls and says they have been working on stretches which pt has been  indicating feeling a stretch still.  Patch is still helping right leg pain.  He states she has been indicating pain in the right lower leg recently mostly in the front but just that it's sore.     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 Yes: NPRS scale: 2/10 Pain location: right shin Pain description: achy Aggravating factors: pt unsure Relieving factors: pt  unsure *First time pt able to verbalize a number and hold up fingers to match.  PRECAUTIONS: Fall-anterior hip precautions have been lifted  WEIGHT BEARING RESTRICTIONS: No  FALLS: Has patient fallen in last 6 months? No  LIVING ENVIRONMENT: Lives with: lives with their spouse and lives with their son-son will be leaving eventually, but is there indefinitely  Lives in: House/apartment Stairs: Yes: Internal: 15 steps; on left going up and External: 2 steps; uses handheld assist and door frame Has following equipment at home: Quad cane small base, Walker - 2 wheeled, bed side commode, Grab bars, and transport chair, chair alarm, bed alarm, call button for son, insertable bed rails  PLOF: Independent with transfers, Needs assistance with ADLs, Needs assistance with homemaking, Needs assistance with gait, and typically has supervision with transfers  PATIENT GOALS: PT asks pt if she would like to get stronger to which she nods.  Son would like for mom to get back to her normal routine safely.  And to be able to leave her alone and know she is safe and for her to transition to the cane just to be a bit more independent.  OBJECTIVE:   TherEx: -Verbally reviewed and tapered HEP down to help with pt fatigue at home and focus on what is currently most challenging.  Discussion with son about ongoing fatigue and ways to modify reps/sets/tasks completed each day to help prevent this and make it more  enjoyable for patient. -BERG:  [facility] PT Assessment - 01/01/23 1123       Berg Balance Test   Sit to Stand Able to stand  independently using hands    Standing Unsupported Able to stand safely 2 minutes    Sitting with Back Unsupported but Feet Supported on Floor or Stool Able to sit safely and securely 2 minutes    Stand to Sit Sits safely with minimal use of hands    Transfers Able to transfer safely, minor use of hands    Standing Unsupported with Eyes Closed Able to stand 10 seconds safely    Standing Unsupported with Feet Together Able to place feet together independently and stand 1 minute safely    From Standing, Reach Forward with Outstretched Arm Can reach confidently >25 cm (10")    From Standing Position, Pick up Object from Floor Able to pick up shoe, needs supervision    From Standing Position, Turn to Look Behind Over each Shoulder Looks behind from both sides and weight shifts well    Turn 360 Degrees Able to turn 360 degrees safely but slowly    Standing Unsupported, Alternately Place Feet on Step/Stool Able to stand independently and complete 8 steps >20 seconds    Standing Unsupported, One Foot in Front Able to take small step independently and hold 30 seconds    Standing on One Leg Tries to lift leg/unable to hold 3 seconds but remains standing independently    Total Score 46    Berg comment: 46/56 = significant to high fall risk            -5xSTS:  18.84 sec w/ BUE support (trial 1); 13.47 sec w/ BUE support (trial 2) -10MWT w/ RW:  12.85 seconds = 0.78 m/sec OR 2.57 ft/sec  PATIENT EDUCATION: Education details: Modified HEP, discussion of low lying tone causing tightness vs true muscular tightness, and continued use of breathing app.   Person educated: Patient and Spouse Education method: Customer service manager Education comprehension: verbalized understanding, returned demonstration, and needs  further education  HOME EXERCISE PROGRAM:  Access Code:  [email or web address] URL: [email or web address] Date: 01/01/2023 Prepared by: Elease Etienne  Exercises - Side Stepping with Resistance at Thighs and Counter Support  - 1 x daily - 5 x weekly - 3 sets - 10 reps - Seated Hamstring Stretch  - 1 x daily - 5 x weekly - 1 sets - 2-3 reps - 45 seconds hold - Long Sitting Calf Stretch with Strap  - 1 x daily - 5 x weekly - 1 sets - 2-3 reps - 45 seconds hold - Seated Single Knee to Chest  - 1 x daily - 5 x weekly - 1 sets - 2-3 reps - 45 seconds hold - Standing Hip Flexor Stretch  - 1 x daily - 5 x weekly - 1 sets - 2-3 reps - 45 seconds hold - Standing Hip Extension with Leg Bent and Support  - 1 x daily - 5 x weekly - 1 sets - 10 reps *Tailored exercises to current pt deficits and tolerance 01/01/2023!  GOALS: Goals reviewed with patient? Yes  SHORT TERM GOALS: Target date: 01/03/2023  Pt will perform strength, stretching, and balance HEP with supervision from family to maintain progress. Baseline:  Revised based on current functional level 01/01/2023 Goal status: MET  2.  Increase Berg score to >/ 42/56 to demo improvement in balance. Baseline: 37/56 (1/2); 46/56 (1/24) Goal status: MET  3.  Pt will decrease 5xSTS to </=13 seconds w/ BUE support in order to demonstrate decreased risk for falls and improved functional bilateral LE strength and power. Baseline: 16.86 sec w/ BUE support; 13.47 sec w/ BUE support (01/01/2023) Goal status: IN PROGRESS  4.  Pt will demonstrate a gait speed of >/=2.5 feet/sec using LRAD in order to decrease risk for falls. Baseline: 2.08 ft/sec w/ RW; 2.57 ft/sec w/ RW (1/24) Goal status: MET  5.  Pt will negotiation 2 stairs w/o rail using LRAD at no more than supervision level in order to improve safe accessibility to home environment. Baseline: Pt needs minA at home to access home via garage or front door; son reports they built a rail and pt is safest with this option at this time (01/02/2023) Goal  status: REVISED-D/C'd  LONG TERM GOALS: Target date: 01/31/2023  FOTO to be assessed w/ LTG set. Baseline: To be assessed. Goal status: INITIAL  2.  BERG to be assessed w/ STG/LTG set as appropriate.  Increase Berg score to >/= 47/56 for reduced fall risk.  Baseline: 37/56 on 12-10-22 Goal status: INITIAL  3.  Pt will decrease 5xSTS to </=13 seconds w/o BUE support in order to demonstrate decreased risk for falls and improved functional bilateral LE strength and power. Baseline: 16.86 sec w/ BUE support Goal status: INITIAL  4.  Pt will demonstrate a gait speed of >/=2.98 feet/sec using LRAD in order to decrease risk for falls. Baseline: 2.08 ft/sec w/ RW Goal status: INITIAL  5.  Pt will ambulate >/=500' over level indoor surfaces at modI level to improve endurance and safety with household mobility. Baseline: 125' w/ RW SBA Goal status: INITIAL  ASSESSMENT:  CLINICAL IMPRESSION: Assessed STGs this visit with patient making good progress overall.  She met 3 of 5 goals, with stair goal being discharged due to safety concerns of performing stairs without rail and son reporting they now have a rail.  She made great progress towards her 5xSTS goal at 13.47 seconds using BUE support compared to evaluation time of 16.86 seconds.  She  ambulates at a speed of 2.57 ft/sec w/ RW and improved her BERG score to 46/56.  She continues to benefit from skilled PT to promote safe functional mobility.  OBJECTIVE IMPAIRMENTS: Abnormal gait, decreased activity tolerance, decreased balance, decreased strength, increased muscle spasms, improper body mechanics, and postural dysfunction.   ACTIVITY LIMITATIONS: carrying, lifting, bending, squatting, stairs, transfers, bed mobility, and locomotion level  PARTICIPATION LIMITATIONS: meal prep, cleaning, laundry, medication management, driving, shopping, and community activity  PERSONAL FACTORS: Age, Fitness, Past/current experiences, Transportation, and 1-2  comorbidities: recent left THA and HTN  are also affecting patient's functional outcome.   REHAB POTENTIAL: Good  CLINICAL DECISION MAKING: Stable/uncomplicated  EVALUATION COMPLEXITY: Low  PLAN:  PT FREQUENCY: 2x/week  PT DURATION: 8 weeks  PLANNED INTERVENTIONS: Therapeutic exercises, Therapeutic activity, Neuromuscular re-education, Balance training, Gait training, Patient/Family education, Self Care, Joint mobilization, Stair training, Vestibular training, DME instructions, Manual therapy, and Re-evaluation  PLAN FOR NEXT SESSION:   Right NMR, LLE strengthening, balance training-maybe manual dual task, compliant/unlevel surfaces, dynamic tasks, gait training with no device vs RW; stair training, eyes closed, all endurance tasks and BREATHING   Sadie Haber, PT, DPT 01/02/2023, 11:49 AM

## 2023-01-01 NOTE — Therapy (Signed)
OUTPATIENT SPEECH LANGUAGE PATHOLOGY TREATMENT   Patient Name: Kelsey Jacobs MRN: [id number] DOB:[birth year], 77 y.o., female Today's Date: 01/01/2023  PCP: Alain Honey MD REFERRING PROVIDER: Basilia Jumbo, FNP  END OF SESSION:  End of Session - 01/01/23 1137     Visit Number 10    Number of Visits 25    Date for SLP Re-Evaluation 02/19/23    Authorization Type medicare    SLP Start Time 1145    SLP Stop Time  1230    SLP Time Calculation (min) 45 min    Activity Tolerance Patient tolerated treatment well                Past Medical History:  Diagnosis Date   Medical history non-contributory    Past Surgical History:  Procedure Laterality Date   BACK SURGERY     FOOT SURGERY     INCISION AND DRAINAGE ABSCESS Left 03/29/2021   Procedure: INCISION AND DRAINAGE AXILLARY ABSCESS;  Surgeon: Griselda Miner, MD;  Location: [facility];  Service: General;  Laterality: Left;   Patient Active Problem List   Diagnosis Date Noted   Axillary abscess 03/30/2021    ONSET DATE: 11/06/22   REFERRING DIAG: E33.295 (ICD-10-CM) - Arterial ischemic stroke, MCA (middle cerebral artery), left, acute (HCC) Z74.09 (ICD-10-CM) - Other reduced mobility Z78.9 (ICD-10-CM) - Other specified health status I69.320 (ICD-10-CM) - Aphasia following cerebral infarction  THERAPY DIAG:  Aphasia  Rationale for Evaluation and Treatment: Rehabilitation  SUBJECTIVE:   SUBJECTIVE STATEMENT: Per PT, pt was able to verbally relay request to use restroom with "I need to use b..."  PAIN:  Are you having pain? No  OBJECTIVE:   TODAY'S TREATMENT:   01-01-23: Kelsey Jacobs demonstrates ability to navigate device to frequently used phrases and topics. Son and husband are encouraging pt to use device to communicate at home. During verbal attempts today, with usual presentation of apraxia impacting intended target, SLP brings awareness to how SGD is helpful in listener determining message and providing a  verbal model for pt to imitate. SLP provided suggestions on icon modification to make model appropriate length (single to two words) to aid in successful imitation by pt. Pt's son modifies x3 icons with mod-I following direct instruction. Pt writes and says x3 family members with mod-I, additional x3 with visual and verbal cue (SGD). Is able to imitate 4/8 friends names with usual repetition and cues to reduce rate. Answers questions re: friends in 4/6 trials with single word response.   12-30-22: Kelsey Jacobs continues to engage with SGD independently at home for practice. Ongoing instruction to spouse that he and their son will also have to use SGD with Kelsey Jacobs to promote her communication by offering choices and helping her navigate to the topic they need to communicate with her. Demonstrated back up of SGD using name: Kelsey Jacobs. Trained spouse in adding photos of family and friends rather than generic icons. Targeted verbal apraxia with use of SGD images, carrier phrase "I like..."  and "I shop    .Marland Kitchen..Marland Kitchen"with consistent max A, Kelsey Jacobs approximated 6/10 sentences. Using SGD as a speech model, Kelsey Jacobs named 4 family and 3 friends. Again used carrier phrase "How is......" and the names of 3 of her friends children to approximate the social question 4/6x with max A.    12-27-22: Pt attends session with Kelsey Jacobs. Report Kelsey Jacobs is becoming more interested in using SGD, will use of own volition at home without prompting from care partners. Target accurate response  to personal Y/N questions, with pt demonstrating 90% accuracy given support to simple language, repetition, written key words. SGD set to fast talk page with yes, no but pt did not need. Demonstrated confirmation supportive communication strategy to Kelsey Jacobs, who was able to teach back strategy and why it is useful with mod-I. Discussion regarding preferences for later today and weekend with SGD to support Kelsey Jacobs verbal communication. Kelsey Jacobs demonstrating navigation between pages to  desired icon with mod-I. Usual attempts to repeat after icon with limited success. Given max multi-modal cueing from SLP is able to approximate ~90% of targets. SLP provides education on reducing overall rate to aid in successful verbal communication- not just at word level but in producing phrases/sentences. Pt with increased accuracy using carrier phrase "I like" + noun when pausing between rote phrase and novel noun.   12-25-22: Targeted multimodal communication using SGD to ID preferences of restaurants and shops. Demonstrated steps to customize device with spouse. Added 4 favorite online shops under "going out" and 2 restaurants ([employer], [employer]) With usual min to mod A, Kelsey Jacobs navigated device to select food then restaurants with cues to correct 2 errors and use to back arrow rather than home button to stay in the same folder when error needs to be corrected. Using SGD, she named 5 family and 2 friends, repeating after device with limited success. She benefitted from choral production, visual aid, syllabification, simple and direct language to approximate names, stores and restaurants. Added hair cut, mani/pedi icons under going out as well.   12-20-22: Target use of SGD to express wants and needs. Pt able to navigate to appropriate page with rare min-A. Pt selects icon and attempts to verbally repeat. Limited success with verbal repetition, benefits from SLP cues (choral production, visual aid, syllabification, simple and direct language). Identified additional need to add to device; son able to add it independently. Target naming simple category of fruits. Pt generates 0 just given category, 0/2 given semantic cues. Direct confrontation naming pt with 90% accuracy, though apraxic errors consistently present. Max-A multimodal cueing to approximate target. Relative strength in writing target name.   12-18-22: SLP provided pt with their trial [employer]raph[employer] device and A in setting per pt's  preferences. Provided education to son on how to modify device and how family can employ to A in communication and therapy practice. With verbal cues, pt able to demonstrate navigation of device through various folders and select dictated icon with 100% accuracy. Pt's son endorses confidence in ability to modify device, will begin with device customization per pt preferences prior to next session.   12-12-22: Target receptive and expressive language in dynamic language task. SLP had pt sort cards into 2 distinct categories, pt with 90% accuracy given rare min-A. Given semantic cues, pt ID appropriate card in 80% of trials (from [facility]), with errors notable for there being a semantic or phonemic similarity in erroneously selection option. SLP requests pt attempt to name, with pt able to name 20% of targets. Marked apraxia continues with usual groping, irregular and erroneous productions of targets despite max-A from SLP. Some benefit observed from reduced rate and visual cues include mouth placement demonstration, syllabification, and written representation of word. Advised pt's trial device would arrive in time for next session. Provided personal interest form for family to fill out.   12-10-22: Target expression of preferences through employment of low tech multimodal communication. Pt able to relay to SLP level of interest in various activities via 3 selection options (  very, somewhat, none) for 24 topics, with pt's non verbal communication and care partners input matching her selection in 100% of trials. Determine dinner preference via binary options. Spouse endroses successful employment of this strategy at home, they find white board very helpful. Target verbal expression through trials of modified melodic intonation therapy, with pt able to approximate x6/7 fruits given usual modeling, visual and written cues. Visual cues to annotate number of syllables successful in avoiding additional sounds to end of words.  Approximations notable for being effortful, inconsistent, and with groping.    PATIENT EDUCATION: Education details: Sees today's treatment and patient instructions, aphasia ed Person educated: Patient, Spouse, and Child(ren) Education method: Explanation, Demonstration, Verbal cues, and Handouts Education comprehension: verbalized understanding, verbal cues required, and needs further education   GOALS: Goals reviewed with patient? Yes  SHORT TERM GOALS: Target date: 12/25/22  Pt will name 5 family members with occasional mod A Baseline: Goal status: MET  2.  Pt will answer personally relevant yes/no questions accurately 85% with multimodal communication Baseline:  Goal status: MET  3.  Pt will ID 5 food and shopping preferences using multimodal communication with occasional min A Baseline:  Goal status: MET  4.  Pt will name 6 items in personally relevant categories using AAC/multimodal communication as needed with occasional mod A Baseline:  Goal status: NOT MET  5.  Family/caregivers will carryover 3 communication strategies to support pt's comprehension and expression Baseline:  Goal status: MET   LONG TERM GOALS: Target date: 02/18/22  Pt will initiate use mulitmodal communication to communicate wants/needs 3x over 1 week with usual min A from caregivers Baseline:  Goal status: IN PROGRESS  2.  Pt or caregivers will customize 4 items on AAC system with rare min A  Baseline:  Goal status: IN PROGRESS  3.  Pt will use multimodal communication to communicate 3 grocery items and 3 personal care items she needs with occasional min A from family Baseline:  Goal status: IN PROGRESS  4.  Pt will access online shopping site she uses and purchase a needed item with occasional  min A from family Baseline:  Goal status: IN PROGRESS  5.  Using compensatory strategies and multimodal communication, pt will take 3turns in conversation with occasional min A Baseline:  Goal  status: IN PROGRESS  ASSESSMENT:  CLINICAL IMPRESSION: Patient is a 77 y.o. female who was seen today for aphasia. 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 She named 0/6 items, with 2 incidents of phonemic paraphasias, bite/book and beak/book. Other responses were facial expressions of frustration and non verbal. Spontaneous speech is limited to 1-2 words with groping and halting. She read 0/6 items on QAB, again with phonemic paraphasia of pity/pig. Venida repeated 0/6 items on QAB. She is inconsistent with yes/no's, family is attempting to help her use thumbs up and down for this. She is not using salient gestures consistently. Prior to CVA, Jakki shopped and looked up recipes on line using her computer. They do not have a tablet. She wrote her name, her son's and spouses name. She was not able to write the pets name or other friends/family members. Communication is frustrating for her and her family. I recommend skilled ST to maximize communication for safety, QOL, independence and to reduce caregiver burden. She needs OT and PT as  well, family stated they felt this would be too much at once  OBJECTIVE IMPAIRMENTS: include attention, memory, aphasia, and apraxia. These impairments are limiting patient from return to work, managing medications, managing appointments, managing finances, household responsibilities, ADLs/IADLs, and effectively communicating at home and in community. Factors affecting potential to achieve goals and functional outcome are severity of impairments. Patient will benefit from skilled SLP services to address above impairments and improve overall function.  REHAB POTENTIAL: Good  PLAN:  SLP FREQUENCY: 2x/week  SLP DURATION: 12 weeks  PLANNED INTERVENTIONS: Language facilitation, Environmental controls, Cueing hierachy,  Cognitive reorganization, Internal/external aids, Functional tasks, Multimodal communication approach, SLP instruction and feedback, Compensatory strategies, and Patient/family education  Speech Therapy Progress Note  Dates of Reporting Period: 11-27-2022 to 11-02-2023  Objective Reports of Subjective Statement: Pt seen for x10 visits targeting aphasia and apraxia. Use of Speech Generating Device for multimodal communication introduced with pt demonstrating increasing proficiency in use.    Objective Measurements: Approximating single word targets with average of 30% accuracy, with inconsistent errors d/t apraxia as primary barrier. Good awareness of errors with attempts to self-correct. Increasing IND with SGD with pt able to navigate between pages and use as verbal model to answer simples questions.   Goal Update: see "GOALS"   Plan: continue per POC  Reason Skilled Services are Required: to increase communication efficacy, address aphasia, apraxia, improve QoL, reduce frustration, reduce caregiver burden.    Su Monks, [place] 01/01/2023, 11:49 AM

## 2023-01-01 NOTE — Patient Instructions (Signed)
Access Code: [email or web address] URL: [email or web address] Date: 01/01/2023 Prepared by: Elease Etienne  Exercises - Side Stepping with Resistance at Thighs and Counter Support  - 1 x daily - 5 x weekly - 3 sets - 10 reps - Seated Hamstring Stretch  - 1 x daily - 5 x weekly - 1 sets - 2-3 reps - 45 seconds hold - Long Sitting Calf Stretch with Strap  - 1 x daily - 5 x weekly - 1 sets - 2-3 reps - 45 seconds hold - Seated Single Knee to Chest  - 1 x daily - 5 x weekly - 1 sets - 2-3 reps - 45 seconds hold - Standing Hip Flexor Stretch  - 1 x daily - 5 x weekly - 1 sets - 2-3 reps - 45 seconds hold - Standing Hip Extension with Leg Bent and Support  - 1 x daily - 5 x weekly - 1 sets - 10 reps  *Tailored exercises to current pt deficits and tolerance 01/01/2023!

## 2023-01-06 ENCOUNTER — Ambulatory Visit: Payer: Medicare Other | Admitting: Occupational Therapy

## 2023-01-06 ENCOUNTER — Ambulatory Visit: Payer: Medicare Other | Admitting: Speech Pathology

## 2023-01-06 ENCOUNTER — Encounter: Payer: Self-pay | Admitting: Occupational Therapy

## 2023-01-06 ENCOUNTER — Encounter: Payer: Self-pay | Admitting: Physical Therapy

## 2023-01-06 ENCOUNTER — Ambulatory Visit: Payer: Medicare Other | Admitting: Physical Therapy

## 2023-01-06 DIAGNOSIS — M6281 Muscle weakness (generalized): Secondary | ICD-10-CM

## 2023-01-06 DIAGNOSIS — R4701 Aphasia: Secondary | ICD-10-CM

## 2023-01-06 DIAGNOSIS — R2681 Unsteadiness on feet: Secondary | ICD-10-CM

## 2023-01-06 DIAGNOSIS — I69351 Hemiplegia and hemiparesis following cerebral infarction affecting right dominant side: Secondary | ICD-10-CM

## 2023-01-06 DIAGNOSIS — R2689 Other abnormalities of gait and mobility: Secondary | ICD-10-CM

## 2023-01-06 DIAGNOSIS — R41840 Attention and concentration deficit: Secondary | ICD-10-CM

## 2023-01-06 DIAGNOSIS — R41842 Visuospatial deficit: Secondary | ICD-10-CM

## 2023-01-06 NOTE — Therapy (Signed)
OUTPATIENT SPEECH LANGUAGE PATHOLOGY TREATMENT   Patient Name: Kelsey Jacobs MRN: [id number] DOB:[birth year], 77 y.o., female Today's Date: 01/06/2023  PCP: Kelsey Honey MD REFERRING PROVIDER: Basilia Jumbo, FNP  END OF SESSION:  End of Session - 01/06/23 1400     Visit Number 11    Number of Visits 25    Date for SLP Re-Evaluation 02/19/23    Authorization Type medicare    SLP Start Time 1400    SLP Stop Time  1445    SLP Time Calculation (min) 45 min    Activity Tolerance Patient tolerated treatment well                 Past Medical History:  Diagnosis Date   Medical history non-contributory    Past Surgical History:  Procedure Laterality Date   BACK SURGERY     FOOT SURGERY     INCISION AND DRAINAGE ABSCESS Left 03/29/2021   Procedure: INCISION AND DRAINAGE AXILLARY ABSCESS;  Surgeon: Kelsey Miner, MD;  Location: [facility];  Service: General;  Laterality: Left;   Patient Active Problem List   Diagnosis Date Noted   Axillary abscess 03/30/2021    ONSET DATE: 11/06/22   REFERRING DIAG: Y48.250 (ICD-10-CM) - Arterial ischemic stroke, MCA (middle cerebral artery), left, acute (HCC) Z74.09 (ICD-10-CM) - Other reduced mobility Z78.9 (ICD-10-CM) - Other specified health status I69.320 (ICD-10-CM) - Aphasia following cerebral infarction  THERAPY DIAG:  Aphasia  Rationale for Evaluation and Treatment: Rehabilitation  SUBJECTIVE:   SUBJECTIVE STATEMENT: "She's been sleeping pretty good." "It was very difficult" to be at [employer] [facility] [employer] to meet friends.   PAIN:  Are you having pain? No  OBJECTIVE:   TODAY'S TREATMENT:   01-06-23: Pt accompanied by spouse this session. Husband (Kelsey Jacobs) reported that their son Kelsey Jacobs) has been working with Kelsey Jacobs using the [employer] device and they've been using it for "everything." SLP re-educated concerning benefits of AAC to support communication. Pt demonstrated independent navigation of device to locate family  members. Her verbal productions increased in accuracy following verbal model through device. Family has updated the device with photos for icons of family members. Provided visual aid (white board), SLP facilitated practice of three word phrases. Pt with ~30% accuracy of speech productions for target phrases (e.g. replacing "want" with "love", consistent apraxic errors). Kelsey Jacobs stated that pt used to go to wine bars a lot ([name], etc.). She likes to drink red wine, pinot noir. SLP added preferred drink to device to support functional communication. Pt's husband reported that he has been doing her PT exercises with her. Pt has demonstrated strength in auditory comprehension, requiring min A to participate in conversation.    01-01-23: Kelsey Jacobs demonstrates ability to navigate device to frequently used phrases and topics. Son and husband are encouraging pt to use device to communicate at home. During verbal attempts today, with usual presentation of apraxia impacting intended target, SLP brings awareness to how SGD is helpful in listener determining message and providing a verbal model for pt to imitate. SLP provided suggestions on icon modification to make model appropriate length (single to two words) to aid in successful imitation by pt. Pt's son modifies x3 icons with mod-I following direct instruction. Pt writes and says x3 family members with mod-I, additional x3 with visual and verbal cue (SGD). Is able to imitate 4/8 friends names with usual repetition and cues to reduce rate. Answers questions re: friends in 4/6 trials with single word response.  12-30-22: Kelsey Jacobs continues to engage with SGD independently at home for practice. Ongoing instruction to spouse that he and their son will also have to use SGD with Kelsey Jacobs to promote her communication by offering choices and helping her navigate to the topic they need to communicate with her. Demonstrated back up of SGD using name: Kelsey Jacobs.  Trained spouse in adding photos of family and friends rather than generic icons. Targeted verbal apraxia with use of SGD images, carrier phrase "I like..."  and "I shop    .Marland Kitchen..Marland Kitchen"with consistent max A, Kelsey Jacobs approximated 6/10 sentences. Using SGD as a speech model, Kelsey Jacobs named 4 family and 3 friends. Again used carrier phrase "How is......" and the names of 3 of her friends children to approximate the social question 4/6x with max A.    12-27-22: Pt attends session with Kelsey Jacobs. Report Kelsey Jacobs is becoming more interested in using SGD, will use of own volition at home without prompting from care partners. Target accurate response to personal Y/N questions, with pt demonstrating 90% accuracy given support to simple language, repetition, written key words. SGD set to fast talk page with yes, no but pt did not need. Demonstrated confirmation supportive communication strategy to Kelsey Jacobs, who was able to teach back strategy and why it is useful with mod-I. Discussion regarding preferences for later today and weekend with SGD to support Kelsey Jacobs's verbal communication. Kelsey Jacobs demonstrating navigation between pages to desired icon with mod-I. Usual attempts to repeat after icon with limited success. Given max multi-modal cueing from SLP is able to approximate ~90% of targets. SLP provides education on reducing overall rate to aid in successful verbal communication- not just at word level but in producing phrases/sentences. Pt with increased accuracy using carrier phrase "I like" + noun when pausing between rote phrase and novel noun.   12-25-22: Targeted multimodal communication using SGD to ID preferences of restaurants and shops. Demonstrated steps to customize device with spouse. Added 4 favorite online shops under "going out" and 2 restaurants ([employer], [employer]) With usual min to mod A, [name] navigated device to select food then restaurants with cues to correct 2 errors and use to back arrow rather than home button to stay in the  same folder when error needs to be corrected. Using SGD, she named 5 family and 2 friends, repeating after device with limited success. She benefitted from choral production, visual aid, syllabification, simple and direct language to approximate names, stores and restaurants. Added hair cut, mani/pedi icons under going out as well.   12-20-22: Target use of SGD to express wants and needs. Pt able to navigate to appropriate page with rare min-A. Pt selects icon and attempts to verbally repeat. Limited success with verbal repetition, benefits from SLP cues (choral production, visual aid, syllabification, simple and direct language). Identified additional need to add to device; son able to add it independently. Target naming simple category of fruits. Pt generates 0 just given category, 0/2 given semantic cues. Direct confrontation naming pt with 90% accuracy, though apraxic errors consistently present. Max-A multimodal cueing to approximate target. Relative strength in writing target name.   12-18-22: SLP provided pt with their trial [employer] device and A in setting per pt's preferences. Provided education to son on how to modify device and how family can employ to A in communication and therapy practice. With verbal cues, pt able to demonstrate navigation of device through various folders and select dictated icon with 100% accuracy. Pt's son endorses confidence in ability to modify  device, will begin with device customization per pt preferences prior to next session.    PATIENT EDUCATION: Education details: Sees today's treatment and patient instructions, aphasia ed Person educated: Patient, Spouse, and Child(ren) Education method: Explanation, Demonstration, Verbal cues, and Handouts Education comprehension: verbalized understanding, verbal cues required, and needs further education   GOALS: Goals reviewed with patient? Yes  SHORT TERM GOALS: Target date: 12/25/22  Pt will name 5 family members  with occasional mod A Baseline: Goal status: MET  2.  Pt will answer personally relevant yes/no questions accurately 85% with multimodal communication Baseline:  Goal status: MET  3.  Pt will ID 5 food and shopping preferences using multimodal communication with occasional min A Baseline:  Goal status: MET  4.  Pt will name 6 items in personally relevant categories using AAC/multimodal communication as needed with occasional mod A Baseline:  Goal status: NOT MET  5.  Family/caregivers will carryover 3 communication strategies to support pt's comprehension and expression Baseline:  Goal status: MET   LONG TERM GOALS: Target date: 02/18/22  Pt will initiate use mulitmodal communication to communicate wants/needs 3x over 1 week with usual min A from caregivers Baseline:  Goal status: IN PROGRESS  2.  Pt or caregivers will customize 4 items on AAC system with rare min A  Baseline:  Goal status: IN PROGRESS  3.  Pt will use multimodal communication to communicate 3 grocery items and 3 personal care items she needs with occasional min A from family Baseline:  Goal status: IN PROGRESS  4.  Pt will access online shopping site she uses and purchase a needed item with occasional  min A from family Baseline:  Goal status: IN PROGRESS  5.  Using compensatory strategies and multimodal communication, pt will take 3turns in conversation with occasional min A Baseline:  Goal status: IN PROGRESS  ASSESSMENT:  CLINICAL IMPRESSION: Patient is a 77 y.o. female who was seen today for aphasia. 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 Pt demonstrating improvement with spontaneous verbal productions. Responsive to verbal models. Imitation at word level, continues to improve. Continue to recommend utilization of SGD, as family reports success in communication with device.  Continue to recommend skilled ST services.   OBJECTIVE IMPAIRMENTS: include attention, memory, aphasia, and apraxia. These impairments are limiting patient from return to work, managing medications, managing appointments, managing finances, household responsibilities, ADLs/IADLs, and effectively communicating at home and in community. Factors affecting potential to achieve goals and functional outcome are severity of impairments. Patient will benefit from skilled SLP services to address above impairments and improve overall function.  REHAB POTENTIAL: Good  PLAN:  SLP FREQUENCY: 2x/week  SLP DURATION: 12 weeks  PLANNED INTERVENTIONS: Language facilitation, Environmental controls, Cueing hierachy, Cognitive reorganization, Internal/external aids, Functional tasks, Multimodal communication approach, SLP instruction and feedback, Compensatory strategies, and Patient/family education  Speech Therapy Progress Note  Dates of Reporting Period: 11-27-2022 to 11-02-2023  Objective Reports of Subjective Statement: Pt seen for x10 visits targeting aphasia and apraxia. Use of Speech Generating Device for multimodal communication introduced with pt demonstrating increasing proficiency in use.    Objective Measurements: Approximating single word targets with average of 30% accuracy, with inconsistent errors d/t apraxia as primary barrier. Good awareness of errors with attempts to self-correct. Increasing IND with SGD with pt able to navigate between pages and use as verbal model to  answer simples questions.   Goal Update: see "GOALS"   Plan: continue per POC  Reason Skilled Services are Required: to increase communication efficacy, address aphasia, apraxia, improve QoL, reduce frustration, reduce caregiver burden.    Leroy Libman, [place] 01/06/2023, 2:06 PM

## 2023-01-06 NOTE — Therapy (Addendum)
OUTPATIENT PHYSICAL THERAPY NEURO TREATMENT NOTE/PROGRESS NOTE   Patient Name: Kelsey Jacobs MRN: [id number] DOB:1946/05/18, 77 y.o., female Today's Date: 01/06/2023  PT progress note for Kelsey Jacobs.  Reporting period 12/05/2022 to 01/06/2023  See Note below for Objective Data and Assessment of Progress/Goals  Thank you for the referral of this patient. Elease Etienne, PT, DPT   PCP: Dr. Michell Heinrich [facility] [place]: Levada Schilling, MD  END OF SESSION:  PT End of Session - 01/06/23 1537     Visit Number 10    Number of Visits 17   16+eval   Date for PT Re-Evaluation 02/07/23   pushed out due to potential scheduling delay   Authorization Type MEDICARE PART A AND B    Progress Note Due on Visit 10    PT Start Time 1533    PT Stop Time 1613    PT Time Calculation (min) 40 min    Equipment Utilized During Treatment Gait belt    Activity Tolerance Patient tolerated treatment well    Behavior During Therapy WFL for tasks assessed/performed               Past Medical History:  Diagnosis Date   Medical history non-contributory    Past Surgical History:  Procedure Laterality Date   BACK SURGERY     FOOT SURGERY     INCISION AND DRAINAGE ABSCESS Left 03/29/2021   Procedure: INCISION AND DRAINAGE AXILLARY ABSCESS;  Surgeon: Jovita Kussmaul, MD;  Location: [facility];  Service: General;  Laterality: Left;   Patient Active Problem List   Diagnosis Date Noted   Axillary abscess 03/30/2021    ONSET DATE: 11/28/2022  REFERRING DIAG: R26.9 (ICD-10-CM) - Gait abnormality  THERAPY DIAG:  Muscle weakness (generalized)  Hemiplegia and hemiparesis following cerebral infarction affecting right dominant side ([place])  Other abnormalities of gait and mobility  Unsteadiness on feet  Rationale for Evaluation and Treatment: Rehabilitation  SUBJECTIVE:                                                                                                                                                                                              SUBJECTIVE STATEMENT:   Pt presents with husband this visit who denies falls and patch is still helping right leg pain.  She has been sleeping very well.     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 Yes: NPRS scale: 2-3/10 Pain location: right thigh and knee Pain description: achy,  sore Aggravating factors: pt unsure Relieving factors: pt unsure  PRECAUTIONS: Fall-anterior hip precautions have been lifted  WEIGHT BEARING RESTRICTIONS: No  FALLS: Has patient fallen in last 6 months? No  LIVING ENVIRONMENT: Lives with: lives with their spouse and lives with their son-son will be leaving eventually, but is there indefinitely  Lives in: House/apartment Stairs: Yes: Internal: 15 steps; on left going up and External: 2 steps; uses handheld assist and door frame Has following equipment at home: Quad cane small base, Walker - 2 wheeled, bed side commode, Grab bars, and transport chair, chair alarm, bed alarm, call button for son, insertable bed rails  PLOF: Independent with transfers, Needs assistance with ADLs, Needs assistance with homemaking, Needs assistance with gait, and typically has supervision with transfers  PATIENT GOALS: PT asks pt if she would like to get stronger to which she nods.  Son would like for mom to get back to her normal routine safely.  And to be able to leave her alone and know she is safe and for her to transition to the cane just to be a bit more independent.  OBJECTIVE:  NMR: -Standing on firm > airex 2lb yellow bar raise overhead for self-perturbations > 2lb wt added to bar for overhead raise, pt has increased posterior sway w/ upward raise on airex -Standing on airex PT provides multidirectional perturbations w/ pt having x2  LOB w/ minA to correct -Using 2lb yellow bar standing on airex feet apart performing multidirectional balloon taps outside BOS; pt self-initiates rest break -SLS toe taps up to 3rd step progressing from BUE to LUE support  THEREX: -5 lb kettlebell Deadlifts to 6" target x12, cued for exhale on forward hinge as pt holding breath -Motor recruitment w/ timing cues for fast motion: lunges, walking march, hip abd/ext at counter several reps performed to fatigue w/ standing rest between each task  GAIT: Gait pattern: step through pattern, decreased arm swing- Right, decreased arm swing- Left, decreased step length- Left, decreased stance time- Right, decreased hip/knee flexion- Right, decreased hip/knee flexion- Left, antalgic, and trendelenburg Distance walked: 460' Assistive device utilized: None Level of assistance: SBA Comments: Pt demonstrates improved left obstacle navigation this visit w/ minimal cues.  She requires continued education on paced breathing during task, but decreased dyspnea noted compared to sessions prior.  PATIENT EDUCATION: Education details: Continue HEP w/ updates to be made next visit.   Person educated: Patient and Spouse Education method: Medical illustrator Education comprehension: verbalized understanding, returned demonstration, and needs further education  HOME EXERCISE PROGRAM:  Access Code: [email or web address] URL: [email or web address] Date: 01/01/2023 Prepared by: Camille Bal  Exercises - Side Stepping with Resistance at Thighs and Counter Support  - 1 x daily - 5 x weekly - 3 sets - 10 reps - Seated Hamstring Stretch  - 1 x daily - 5 x weekly - 1 sets - 2-3 reps - 45 seconds hold - Long Sitting Calf Stretch with Strap  - 1 x daily - 5 x weekly - 1 sets - 2-3 reps - 45 seconds hold - Seated Single Knee to Chest  - 1 x daily - 5 x weekly - 1 sets - 2-3 reps - 45 seconds hold - Standing Hip Flexor Stretch  - 1 x daily - 5 x weekly - 1  sets - 2-3 reps - 45 seconds hold - Standing Hip Extension with Leg Bent and Support  - 1 x daily - 5 x weekly - 1 sets - 10 reps *Tailored  exercises to current pt deficits and tolerance 01/01/2023!  GOALS: Goals reviewed with patient? Yes  SHORT TERM GOALS: Target date: 01/03/2023  Pt will perform strength, stretching, and balance HEP with supervision from family to maintain progress. Baseline:  Revised based on current functional level 01/01/2023 Goal status: MET  2.  Increase Berg score to >/ 42/56 to demo improvement in balance. Baseline: 37/56 (1/2); 46/56 (1/24) Goal status: MET  3.  Pt will decrease 5xSTS to </=13 seconds w/ BUE support in order to demonstrate decreased risk for falls and improved functional bilateral LE strength and power. Baseline: 16.86 sec w/ BUE support; 13.47 sec w/ BUE support (01/01/2023) Goal status: IN PROGRESS  4.  Pt will demonstrate a gait speed of >/=2.5 feet/sec using LRAD in order to decrease risk for falls. Baseline: 2.08 ft/sec w/ RW; 2.57 ft/sec w/ RW (1/24) Goal status: MET  5.  Pt will negotiation 2 stairs w/o rail using LRAD at no more than supervision level in order to improve safe accessibility to home environment. Baseline: Pt needs minA at home to access home via garage or front door; son reports they built a rail and pt is safest with this option at this time (01/02/2023) Goal status: REVISED-D/C'd  LONG TERM GOALS: Target date: 01/31/2023  Pt will increase FOTO score to 69% in order to demonstrate subjective functional improvement. Baseline: 54% Goal status: INITIAL  2.  BERG to be assessed w/ STG/LTG set as appropriate.  Increase Berg score to >/= 47/56 for reduced fall risk.  Baseline: 37/56 on 12-10-22 Goal status: INITIAL  3.  Pt will decrease 5xSTS to </=13 seconds w/o BUE support in order to demonstrate decreased risk for falls and improved functional bilateral LE strength and power. Baseline: 16.86 sec w/ BUE support Goal  status: INITIAL  4.  Pt will demonstrate a gait speed of >/=2.98 feet/sec using LRAD in order to decrease risk for falls. Baseline: 2.08 ft/sec w/ RW Goal status: INITIAL  5.  Pt will ambulate >/=500' over level indoor surfaces at modI level to improve endurance and safety with household mobility. Baseline: 125' w/ RW SBA Goal status: INITIAL  ASSESSMENT:  CLINICAL IMPRESSION: Time in session divided to address RLE NMR, endurance with ambulation without an AD, and functional strength.  Pt continues to be somewhat limited by impaired aerobic capacity which appears to be improving this visit.  She demonstrates improved verbal engagement with therapist this session better expressing response to activity with less indicated pain.  Her overall balance and visual scanning are slowly improving towards goal levels, but she could benefit from additions to HEP to address motor recruitment and high level dynamic challenges.  Will continue POC.  OBJECTIVE IMPAIRMENTS: Abnormal gait, decreased activity tolerance, decreased balance, decreased strength, increased muscle spasms, improper body mechanics, and postural dysfunction.   ACTIVITY LIMITATIONS: carrying, lifting, bending, squatting, stairs, transfers, bed mobility, and locomotion level  PARTICIPATION LIMITATIONS: meal prep, cleaning, laundry, medication management, driving, shopping, and community activity  PERSONAL FACTORS: Age, Fitness, Past/current experiences, Transportation, and 1-2 comorbidities: recent left THA and HTN  are also affecting patient's functional outcome.   REHAB POTENTIAL: Good  CLINICAL DECISION MAKING: Stable/uncomplicated  EVALUATION COMPLEXITY: Low  PLAN:  PT FREQUENCY: 2x/week  PT DURATION: 8 weeks  PLANNED INTERVENTIONS: Therapeutic exercises, Therapeutic activity, Neuromuscular re-education, Balance training, Gait training, Patient/Family education, Self Care, Joint mobilization, Stair training, Vestibular  training, DME instructions, Manual therapy, and Re-evaluation  PLAN FOR NEXT SESSION:   Right NMR, LLE strengthening,  balance training-maybe manual dual task, compliant/unlevel surfaces, dynamic tasks, gait training with no device vs RW; stair training, eyes closed, all endurance tasks and BREATHING, update/review HEP   Bary Richard, PT, DPT 01/06/2023, 4:46 PM

## 2023-01-06 NOTE — Therapy (Signed)
OUTPATIENT OCCUPATIONAL THERAPY NEURO TREATMENT  Patient Name: Kelsey Jacobs MRN: [id number] DOB:20-Jan-1946, 77 y.o., female Today's Date: 01/06/2023  PCP: Dr. Vanessa Barbara  REFERRING PROVIDER: Levada Schilling, MD  END OF SESSION:  OT End of Session - 01/06/23 1639     Visit Number 6    Number of Visits 17    Date for OT Re-Evaluation 02/16/23    Authorization Type MCR, UHC    Progress Note Due on Visit 10    OT Start Time 1616    OT Stop Time 1655    OT Time Calculation (min) 39 min    Activity Tolerance Patient tolerated treatment well    Behavior During Therapy WFL for tasks assessed/performed              Past Medical History:  Diagnosis Date   Medical history non-contributory    Past Surgical History:  Procedure Laterality Date   BACK SURGERY     FOOT SURGERY     INCISION AND DRAINAGE ABSCESS Left 03/29/2021   Procedure: INCISION AND DRAINAGE AXILLARY ABSCESS;  Surgeon: Jovita Kussmaul, MD;  Location: [facility];  Service: General;  Laterality: Left;   Patient Active Problem List   Diagnosis Date Noted   Axillary abscess 03/30/2021    ONSET DATE: 12/04/2022 referral date  REFERRING DIAG:  Diagnosis  I63.512 (ICD-10-CM) - Cerebral infarction due to unspecified occlusion or stenosis of left middle cerebral artery  Z74.09 (ICD-10-CM) - Other reduced mobility  Z78.9 (ICD-10-CM) - Other specified health status  I69.320 (ICD-10-CM) - Aphasia following cerebral infarction    THERAPY DIAG:  Muscle weakness (generalized)  Hemiplegia and hemiparesis following cerebral infarction affecting right dominant side (HCC)  Visuospatial deficit  Attention and concentration deficit  Aphasia  Rationale for Evaluation and Treatment: Rehabilitation  SUBJECTIVE:   SUBJECTIVE STATEMENT: She has been doing better with her overall activity tolerance and breathing.   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  4/10 RT thigh - O.T. not addressing  FALLS: Has patient fallen in last 6 months? No  LIVING ENVIRONMENT: Lives with: lives with their spouse and lives with their son-son will be leaving eventually, but is there indefinitely  Lives in: House/apartment (pt lives on first floor)  Stairs: Yes: Internal: 15 steps; on left going up and External: 2 steps; uses handheld assist and door frame Has following equipment at home: Quad cane small base, Walker - 2 wheeled, bed side commode, Grab bars, and transport chair, chair alarm, bed alarm, call button for son, insertable bed rails   PLOF: Independent with transfers, Needs assistance with ADLs, Needs assistance with homemaking, Needs assistance with gait, and typically has supervision with transfers   PATIENT GOALS: PT asks pt if she would like to get stronger to which she nods.  Son would like for mom to get back to her normal routine safely.  And to be able to leave her alone and know she is safe and for her to transition to the cane just to be a bit more independent.    OBJECTIVE: (from evaluation unless otherwise noted)  HAND DOMINANCE: Right  ADLs: Overall ADLs: mostly supervision/cueing, min assist at times Transfers/ambulation related to ADLs: Eating: mod I  Grooming: mod I  UB Dressing: mod I  LB Dressing: min assist  to don Lt foot in pants and occasionally Lt sock, slip on shoes Toileting: mod I (occasional assist for perineal care w/ BM)  Bathing: set up, cues for thoroughness Tub Shower transfers: supervision/cues Equipment:  BSC for shower seat, grab bars  IADLs: Shopping: husband has done for several years Light housekeeping: dependent, has cleaning lady for heavier cleaning 1x/month Meal Prep: dependent at this time Community mobility: not currently driving -  relying on family Medication management: husband administering Financial management: son has been doing Handwriting: 100% legible and print, name only  MOBILITY STATUS:  uses walker   UPPER EXTREMITY ROM:  BUE AROM WNLs   UPPER EXTREMITY MMT:   RUE grossly 4/5, LUE MMT grossly 5/5   HAND FUNCTION: Grip strength: Right: 38.1 lbs; Left: 33.0 lbs  COORDINATION: 9 Hole Peg test: Right: 28 sec; Left: 26 sec  SENSATION: Slightly dulled RUE, pt appears to detect stimuli and localization  EDEMA: none   COGNITION: Overall cognitive status: Difficulty to assess due to: Communication impairment (aphasia). Pt can follow 1 step visual cues/demo  VISION: Subjective report: bluriness since stroke Baseline vision: Wears glasses all the time Visual history: corrective eye surgery  VISION ASSESSMENT: Not tested D/t aphasia   PERCEPTION: Not tested  PRAXIS: Not tested  OBSERVATIONS: Pt mostly limited by Mervin Hack, overall needs sup/min assist for BADLS  TODAY'S TREATMENT:                                                                                                                              - Therapeutic activities completed for duration as noted below including:  1 lb wrist weight put on pt's RUE as she completed the following -   Item reading and identifying with animal cards as written by therapist. 100% accuracy with animal identification using RUE for coordination and strengthening but 25% accuracy with expression of animal name.   Pt completed large wooden sheep puzzle with cues to place pieces with RUE for coordination. 100% accuracy with piece placement.  3 item retrieval in kitchen with labeled cabinets and drawers with 50% accuracy. Pt then placed items back in cabinets with 100% accuracy.   Pt retrieved all 5 post it notes from cabinets as called out by therapist with 50% accuracy with use of RUE for challenge to coordination and strength.   Wrist weight removed -  Pt completed bimanual coordination shoulder flexion, shoulder abduction, elbow ext and flex, and chest presses. Pt able to maintain symmetry 75% of time with 10 reps each and use of mirror for external feedback.   PATIENT EDUCATION: Education details: item recognition Person educated: Patient and Spouse Education method: Explanation, Demonstration, Tactile cues, and Verbal cues Education comprehension: verbalized understanding, returned demonstration, verbal cues required, tactile cues required, and needs further education  HOME EXERCISE PROGRAM: 1/12 - dowel HEP   GOALS: Goals reviewed with patient? Yes  SHORT TERM GOALS: Target date: 01/18/23  Independent with HEP for RUE strengthening  Baseline: Goal status: IN PROGRESS  2.  Pt to perform LE dressing (donning pants and Lt sock) w/ AE and no more than min cues Baseline:  Goal status: MET  3.  Pt to prepare simple snack, sandwich, or microwaveable items with min cues and direct sup Baseline:  Goal status: IN PROGRESS  4.  Pt to perform bathing and perineal care with no more than min cues/sup Baseline:  Goal status: IN PROGRESS   LONG TERM GOALS: Target date: 02/16/23  Independent with all BADLS Baseline:  Goal status: IN PROGRESS  2.  Pt to perform familiar stovetop meal with direct supervision and min cues prn Baseline:  Goal status: INITIAL  3.  Pt to consistently perform light IADLS including washing dishes and laundry Baseline:  Goal status: IN PROGRESS   ASSESSMENT:  CLINICAL IMPRESSION: Pt is good candidate for skilled OT services following CVA to improve independence and safety with ADLs and IADLs.   PERFORMANCE DEFICITS: in functional skills including ADLs, IADLs, coordination, dexterity, sensation, strength, mobility, body mechanics, decreased knowledge of use of DME, and UE functional use, cognitive skills including attention and safety awareness, and psychosocial skills including coping strategies.    IMPAIRMENTS: are limiting patient from ADLs, IADLs, leisure, and social participation.   CO-MORBIDITIES: may have co-morbidities  that affects occupational performance. Patient will benefit from skilled OT to address above impairments and improve overall function.  REHAB POTENTIAL: Good  PLAN:  OT FREQUENCY: 2x/week  OT DURATION: 8 weeks, plus evaluation (may only need 6 weeks)   PLANNED INTERVENTIONS: self care/ADL training, therapeutic exercise, therapeutic activity, neuromuscular re-education, balance training, functional mobility training, moist heat, patient/family education, cognitive remediation/compensation, visual/perceptual remediation/compensation, coping strategies training, and DME and/or AE instructions  RECOMMENDED OTHER SERVICES: none at this time  CONSULTED AND AGREED WITH PLAN OF CARE: Patient and family member/caregiver  PLAN FOR NEXT SESSION:  progress to strengthening as tolerated; env scanning review; ADL completion   Delana Meyer, OT 01/06/2023, 5:09 PM

## 2023-01-06 NOTE — Patient Instructions (Signed)
Add TV shows and preferred activities to the device   If there is any communication breakdown or frustration due to a missing word, please add it to the device   Thank you for adding photos of family!

## 2023-01-08 ENCOUNTER — Ambulatory Visit: Payer: Medicare Other | Admitting: Occupational Therapy

## 2023-01-08 ENCOUNTER — Encounter: Payer: Self-pay | Admitting: Occupational Therapy

## 2023-01-08 ENCOUNTER — Encounter: Payer: Self-pay | Admitting: Physical Therapy

## 2023-01-08 ENCOUNTER — Ambulatory Visit: Payer: Medicare Other | Admitting: Speech Pathology

## 2023-01-08 ENCOUNTER — Ambulatory Visit: Payer: Medicare Other | Admitting: Physical Therapy

## 2023-01-08 DIAGNOSIS — R4701 Aphasia: Secondary | ICD-10-CM

## 2023-01-08 DIAGNOSIS — M6281 Muscle weakness (generalized): Secondary | ICD-10-CM

## 2023-01-08 DIAGNOSIS — R41840 Attention and concentration deficit: Secondary | ICD-10-CM

## 2023-01-08 DIAGNOSIS — I69351 Hemiplegia and hemiparesis following cerebral infarction affecting right dominant side: Secondary | ICD-10-CM

## 2023-01-08 DIAGNOSIS — R2681 Unsteadiness on feet: Secondary | ICD-10-CM

## 2023-01-08 DIAGNOSIS — R41842 Visuospatial deficit: Secondary | ICD-10-CM

## 2023-01-08 DIAGNOSIS — R2689 Other abnormalities of gait and mobility: Secondary | ICD-10-CM

## 2023-01-08 NOTE — Patient Instructions (Signed)
   Use the white board on her device  Continue to practice writing at the word level  Find a category Juley is familiar with, today we did spices and herbs - feel free to use this again - it doesn't hurt to repeat  Make up words (blush, lipstick etc), food orders, colors, clothing, drinks, desserts and write 5-10 words   Use fill in the blank if needed  Have her read the words aloud

## 2023-01-08 NOTE — Therapy (Signed)
OUTPATIENT OCCUPATIONAL THERAPY NEURO TREATMENT  Patient Name: Kelsey Jacobs MRN: [id number] DOB:November 18, 1946, 77 y.o., female Today's Date: 01/08/2023  PCP: Dr. Vanessa Barbara  REFERRING PROVIDER: Levada Schilling, MD  END OF SESSION:  OT End of Session - 01/08/23 1228     Visit Number 7    Number of Visits 17    Date for OT Re-Evaluation 02/16/23    Authorization Type MCR, UHC    Progress Note Due on Visit 10    OT Start Time 1232    OT Stop Time 1311    OT Time Calculation (min) 39 min    Activity Tolerance Patient tolerated treatment well    Behavior During Therapy WFL for tasks assessed/performed             Past Medical History:  Diagnosis Date   Medical history non-contributory    Past Surgical History:  Procedure Laterality Date   BACK SURGERY     FOOT SURGERY     INCISION AND DRAINAGE ABSCESS Left 03/29/2021   Procedure: INCISION AND DRAINAGE AXILLARY ABSCESS;  Surgeon: Autumn Messing III, MD;  Location: [facility];  Service: General;  Laterality: Left;   Patient Active Problem List   Diagnosis Date Noted   Axillary abscess 03/30/2021    ONSET DATE: 12/04/2022 referral date  REFERRING DIAG:  Diagnosis  I63.512 (ICD-10-CM) - Cerebral infarction due to unspecified occlusion or stenosis of left middle cerebral artery  Z74.09 (ICD-10-CM) - Other reduced mobility  Z78.9 (ICD-10-CM) - Other specified health status  I69.320 (ICD-10-CM) - Aphasia following cerebral infarction    THERAPY DIAG:  Muscle weakness (generalized)  Hemiplegia and hemiparesis following cerebral infarction affecting right dominant side (HCC)  Visuospatial deficit  Attention and concentration deficit  Aphasia  Rationale for Evaluation and Treatment: Rehabilitation  SUBJECTIVE:   SUBJECTIVE STATEMENT: She has been completing tracing and puzzles at home.   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  2/10 BLE - O.T. not addressing  FALLS: Has patient fallen in last 6 months? No  LIVING ENVIRONMENT: Lives with: lives with their spouse and lives with their son-son will be leaving eventually, but is there indefinitely  Lives in: House/apartment (pt lives on first floor)  Stairs: Yes: Internal: 15 steps; on left going up and External: 2 steps; uses handheld assist and door frame Has following equipment at home: Quad cane small base, Walker - 2 wheeled, bed side commode, Grab bars, and transport chair, chair alarm, bed alarm, call button for son, insertable bed rails   PLOF: Independent with transfers, Needs assistance with ADLs, Needs assistance with homemaking, Needs assistance with gait, and typically has supervision with transfers   PATIENT GOALS: PT asks pt if she would like to get stronger to which she nods.  Son would like for mom to get back to her normal routine safely.  And to be able to leave her alone and know she is safe and for her to transition to the cane just to be a bit more independent.    OBJECTIVE: (from evaluation unless otherwise noted)  HAND DOMINANCE: Right  ADLs: Overall ADLs: mostly supervision/cueing, min assist at times Transfers/ambulation related to ADLs: Eating: mod I  Grooming: mod I  UB Dressing: mod I  LB Dressing: min assist to don Lt foot in  pants and occasionally Lt sock, slip on shoes Toileting: mod I (occasional assist for perineal care w/ BM)  Bathing: set up, cues for thoroughness Tub Shower transfers: supervision/cues Equipment:  BSC for shower seat, grab bars  IADLs: Shopping: husband has done for several years Light housekeeping: dependent, has cleaning lady for heavier cleaning 1x/month Meal Prep: dependent at this time Community mobility: not currently driving - relying on family Medication  management: husband administering Financial management: son has been doing Handwriting: 100% legible and print, name only  MOBILITY STATUS:  uses walker   UPPER EXTREMITY ROM:  BUE AROM WNLs   UPPER EXTREMITY MMT:   RUE grossly 4/5, LUE MMT grossly 5/5   HAND FUNCTION: Grip strength: Right: 38.1 lbs; Left: 33.0 lbs  COORDINATION: 9 Hole Peg test: Right: 28 sec; Left: 26 sec  SENSATION: Slightly dulled RUE, pt appears to detect stimuli and localization  EDEMA: none   COGNITION: Overall cognitive status: Difficulty to assess due to: Communication impairment (aphasia). Pt can follow 1 step visual cues/demo  VISION: Subjective report: bluriness since stroke Baseline vision: Wears glasses all the time Visual history: corrective eye surgery  VISION ASSESSMENT: Not tested D/t aphasia   PERCEPTION: Not tested  PRAXIS: Not tested  OBSERVATIONS: Pt mostly limited by Mervin Hack, overall needs sup/min assist for BADLS  TODAY'S TREATMENT:                                                                                                                              - Neuro re-education completed for duration as noted below including:  Pt completed 36 piece puzzle on iPad with cues to place pieces with RUE for coordination. 80% accuracy with piece placement requiring encouragement from therapist to finish.  yellow Thera-Band HEP including shoulder flexion, horizontal abd/add, bicep curl, and tricep extension to promote strengthening of affected extremity and overall endurance as noted in pt instructions.    OT placed 6 BlazePods in front of patient and had patient tap pods with palm of hand and bimanually as pods lit up using focus mode for improved processing, scanning and locating of items, reaction time, upper extremity range of motion, gross motor coordination and bimanual coordination/trunk control.  Pt had 2 strikes and 63 hits in 2 minutes with average reaction time of 164ms   using R hand Pt had 0 strikes and 58 hits in 2 minutes with average reaction time of 187ms using 2 hands PATIENT EDUCATION: Education details:Theraband HEP Person educated: Patient and Spouse Education method: Explanation, Demonstration, Tactile cues, Verbal cues, and Handouts Education comprehension: verbalized understanding, returned demonstration, verbal cues required, tactile cues required, and needs further education  HOME EXERCISE PROGRAM: 1/12 - dowel HEP 1/31 - RUE yellow theraband    GOALS: Goals reviewed with patient? Yes  SHORT TERM GOALS: Target date: 01/18/23  Independent with HEP for RUE strengthening Baseline: Goal status: IN PROGRESS  2.  Pt to perform LE dressing (donning pants and  Lt sock) w/ AE and no more than min cues Baseline:  Goal status: MET  3.  Pt to prepare simple snack, sandwich, or microwaveable items with min cues and direct sup Baseline:  Goal status: IN PROGRESS  4.  Pt to perform bathing and perineal care with no more than min cues/sup Baseline:  Goal status: IN PROGRESS   LONG TERM GOALS: Target date: 02/16/23  Independent with all BADLS Baseline:  Goal status: IN PROGRESS  2.  Pt to perform familiar stovetop meal with direct supervision and min cues prn Baseline:  Goal status: INITIAL  3.  Pt to consistently perform light IADLS including washing dishes and laundry Baseline:  Goal status: IN PROGRESS   ASSESSMENT:  CLINICAL IMPRESSION: Pt is good candidate for skilled OT services following CVA to improve independence and safety with ADLs and IADLs.   PERFORMANCE DEFICITS: in functional skills including ADLs, IADLs, coordination, dexterity, sensation, strength, mobility, body mechanics, decreased knowledge of use of DME, and UE functional use, cognitive skills including attention and safety awareness, and psychosocial skills including coping strategies.   IMPAIRMENTS: are limiting patient from ADLs, IADLs, leisure, and social  participation.   CO-MORBIDITIES: may have co-morbidities  that affects occupational performance. Patient will benefit from skilled OT to address above impairments and improve overall function.  REHAB POTENTIAL: Good  PLAN:  OT FREQUENCY: 2x/week  OT DURATION: 8 weeks, plus evaluation (may only need 6 weeks)   PLANNED INTERVENTIONS: self care/ADL training, therapeutic exercise, therapeutic activity, neuromuscular re-education, balance training, functional mobility training, moist heat, patient/family education, cognitive remediation/compensation, visual/perceptual remediation/compensation, coping strategies training, and DME and/or AE instructions  RECOMMENDED OTHER SERVICES: none at this time  CONSULTED AND AGREED WITH PLAN OF CARE: Patient and family member/caregiver  PLAN FOR NEXT SESSION:  Review theraband HEP; env scanning review; ADL completion; higher level cognition   Dennis Bast, OT 01/08/2023, 1:52 PM

## 2023-01-08 NOTE — Patient Instructions (Signed)
  Access Code: [email or web address] URL: [email or web address] Date: 01/08/2023 Prepared by: Elease Etienne  Exercises - Seated Hamstring Stretch  - 1 x daily - 5 x weekly - 1 sets - 2-3 reps - 45 seconds hold - Seated Single Knee to Chest  - 1 x daily - 5 x weekly - 1 sets - 2-3 reps - 45 seconds hold - Standing Hip Flexor Stretch  - 1 x daily - 5 x weekly - 1 sets - 2-3 reps - 45 seconds hold - Standing Hip Extension with Leg Bent and Support  - 1 x daily - 5 x weekly - 1 sets - 10 reps - Forward Backward Monster Walk with Band at [employer] and Counter Support  - 1 x daily - 5 x weekly - 3 sets - 10 reps - Tandem Walking with Counter Support  - 1 x daily - 5 x weekly - 3 sets - 10 reps - Backward Walking with Counter Support  - 1 x daily - 5 x weekly - 3 sets - 10 reps

## 2023-01-08 NOTE — Therapy (Signed)
OUTPATIENT SPEECH LANGUAGE PATHOLOGY TREATMENT   Patient Name: Kelsey Jacobs MRN: [id number] DOB:05/27/1946, 77 y.o., female 5 Date: 01/08/2023  PCP: Kelsey Beach MD REFERRING PROVIDER: Gwendolyn Lima, FNP  END OF SESSION:  End of Session - 01/08/23 1451     Visit Number 12    Number of Visits 25    Date for SLP Re-Evaluation 02/19/23    Authorization Type medicare    SLP Start Time 1400    SLP Stop Time  1445    SLP Time Calculation (min) 45 min    Activity Tolerance Patient tolerated treatment well                 Past Medical History:  Diagnosis Date   Medical history non-contributory    Past Surgical History:  Procedure Laterality Date   BACK SURGERY     FOOT SURGERY     INCISION AND DRAINAGE ABSCESS Left 03/29/2021   Procedure: INCISION AND DRAINAGE AXILLARY ABSCESS;  Surgeon: Kelsey Kussmaul, MD;  Location: [facility];  Service: General;  Laterality: Left;   Patient Active Problem List   Diagnosis Date Noted   Axillary abscess 03/30/2021    ONSET DATE: 11/06/22   REFERRING DIAG: G29.528 (ICD-10-CM) - Arterial ischemic stroke, Kelsey (middle cerebral artery), left, acute (Kelsey Jacobs) Z74.09 (ICD-10-CM) - Other reduced mobility Z78.9 (ICD-10-CM) - Other specified health status I69.320 (ICD-10-CM) - Aphasia following cerebral infarction  THERAPY DIAG:  No diagnosis found.  Rationale for Evaluation and Treatment: Rehabilitation  SUBJECTIVE:   SUBJECTIVE STATEMENT: "She's been sleeping pretty good."  PAIN:  Are you having pain? No  OBJECTIVE:   TODAY'S TREATMENT:   01-08-23: Targeted use of [employer] Touch Talk writing/drawing screen to train in use of this feature to augment communication as well as target written communication. Kelsey Jacobs wrote family names to command 5/6x with rare min to mod verbal and written cues. She wrote names of basic objects 10/10 with usual min to mod verbal and written cues. In personally relevant category, Kelsey Jacobs wrote 10  herbs/spices with consistent written, fill in the blank cues and usual min A (written cues) to ID and correct 4/7 errors. Kelsey Jacobs verbally approximate names, objects and category naming with frequent mod to max A of placement cues, choral speech and phrase completion. Trained her to use delete and erase features with frequent mod A. Instructed Al to have [name] in categories (see patient instructions) and to accept if she says a letter wrong, but writes is correctly that this is normal with aphasia  01-06-23: Pt accompanied by spouse this session. Husband (Al) reported that their son Kelsey Jacobs) has been working with Kelsey Jacobs using the [employer] device and they've been using it for "everything." SLP re-educated concerning benefits of AAC to support communication. Pt demonstrated independent navigation of device to locate family members. Her verbal productions increased in accuracy following verbal model through device. Family has updated the device with photos for icons of family members. Provided visual aid (white board), SLP facilitated practice of three word phrases. Pt with ~30% accuracy of speech productions for target phrases (e.g. replacing "want" with "love", consistent apraxic errors). Al stated that pt used to go to wine bars a lot ([name], etc.). She likes to drink red wine, pinot noir. SLP added preferred drink to device to support functional communication. Pt's husband reported that he has been doing her PT exercises with her. Pt has demonstrated strength in auditory comprehension, requiring min A to participate  in conversation.    01-01-23: [name] demonstrates ability to navigate device to frequently used phrases and topics. Son and husband are encouraging pt to use device to communicate at home. During verbal attempts today, with usual presentation of apraxia impacting intended target, SLP brings awareness to how SGD is helpful in listener determining message and  providing a verbal model for pt to imitate. SLP provided suggestions on icon modification to make model appropriate length (single to two words) to aid in successful imitation by pt. Pt's son modifies x3 icons with mod-I following direct instruction. Pt writes and says x3 family members with mod-I, additional x3 with visual and verbal cue (SGD). Is able to imitate 4/8 friends names with usual repetition and cues to reduce rate. Answers questions re: friends in 4/6 trials with single word response.   12-30-22: Kelsey Jacobs continues to engage with SGD independently at home for practice. Ongoing instruction to spouse that he and their son will also have to use SGD with Kelsey Jacobs to promote her communication by offering choices and helping her navigate to the topic they need to communicate with her. Demonstrated back up of SGD using name: Kelsey Jacobs. Trained spouse in adding photos of family and friends rather than generic icons. Targeted verbal apraxia with use of SGD images, carrier phrase "I like..."  and "I shop    .Marland Kitchen..Marland Kitchen"with consistent max A, Berry approximated 6/10 sentences. Using SGD as a speech model, Kelsey Jacobs named 4 family and 3 friends. Again used carrier phrase "How is......" and the names of 3 of her friends children to approximate the social question 4/6x with max A.    12-27-22: Pt attends session with Al. Report Kelsey Jacobs is becoming more interested in using SGD, will use of own volition at home without prompting from care partners. Target accurate response to personal Y/N questions, with pt demonstrating 90% accuracy given support to simple language, repetition, written key words. SGD set to fast talk page with yes, no but pt did not need. Demonstrated confirmation supportive communication strategy to Al, who was able to teach back strategy and why it is useful with mod-I. Discussion regarding preferences for later today and weekend with SGD to support Kelsey Jacobs's verbal communication. Kelsey Jacobs demonstrating navigation between  pages to desired icon with mod-I. Usual attempts to repeat after icon with limited success. Given max multi-modal cueing from SLP is able to approximate ~90% of targets. SLP provides education on reducing overall rate to aid in successful verbal communication- not just at word level but in producing phrases/sentences. Pt with increased accuracy using carrier phrase "I like" + noun when pausing between rote phrase and novel noun.   12-25-22: Targeted multimodal communication using SGD to ID preferences of restaurants and shops. Demonstrated steps to customize device with spouse. Added 4 favorite online shops under "going out" and 2 restaurants ([employer], [employer]) With usual min to mod A, Mushka navigated device to select food then restaurants with cues to correct 2 errors and use to back arrow rather than home button to stay in the same folder when error needs to be corrected. Using SGD, she named 5 family and 2 friends, repeating after device with limited success. She benefitted from choral production, visual aid, syllabification, simple and direct language to approximate names, stores and restaurants. Added hair cut, mani/pedi icons under going out as well.   12-20-22: Target use of SGD to express wants and needs. Pt able to navigate to appropriate page with rare min-A. Pt selects icon and attempts  to verbally repeat. Limited success with verbal repetition, benefits from SLP cues (choral production, visual aid, syllabification, simple and direct language). Identified additional need to add to device; son able to add it independently. Target naming simple category of fruits. Pt generates 0 just given category, 0/2 given semantic cues. Direct confrontation naming pt with 90% accuracy, though apraxic errors consistently present. Max-A multimodal cueing to approximate target. Relative strength in writing target name.   12-18-22: SLP provided pt with their trial Lingraph[employer] device and A in setting per pt's  preferences. Provided education to son on how to modify device and how family can employ to A in communication and therapy practice. With verbal cues, pt able to demonstrate navigation of device through various folders and select dictated icon with 100% accuracy. Pt's son endorses confidence in ability to modify device, will begin with device customization per pt preferences prior to next session.    PATIENT EDUCATION: Education details: Sees today's treatment and patient instructions, aphasia ed Person educated: Patient, Spouse, and Child(ren) Education method: Explanation, Demonstration, Verbal cues, and Handouts Education comprehension: verbalized understanding, verbal cues required, and needs further education   GOALS: Goals reviewed with patient? Yes  SHORT TERM GOALS: Target date: 12/25/22  Pt will name 5 family members with occasional mod A Baseline: Goal status: MET  2.  Pt will answer personally relevant yes/no questions accurately 85% with multimodal communication Baseline:  Goal status: MET  3.  Pt will ID 5 food and shopping preferences using multimodal communication with occasional min A Baseline:  Goal status: MET  4.  Pt will name 6 items in personally relevant categories using AAC/multimodal communication as needed with occasional mod A Baseline:  Goal status: NOT MET  5.  Family/caregivers will carryover 3 communication strategies to support pt's comprehension and expression Baseline:  Goal status: MET   LONG TERM GOALS: Target date: 02/18/22  Pt will initiate use mulitmodal communication to communicate wants/needs 3x over 1 week with usual min A from caregivers Baseline:  Goal status: IN PROGRESS  2.  Pt or caregivers will customize 4 items on AAC system with rare min A  Baseline:  Goal status: IN PROGRESS  3.  Pt will use multimodal communication to communicate 3 grocery items and 3 personal care items she needs with occasional min A from  family Baseline:  Goal status: IN PROGRESS  4.  Pt will access online shopping site she uses and purchase a needed item with occasional  min A from family Baseline:  Goal status: IN PROGRESS  5.  Using compensatory strategies and multimodal communication, pt will take 3turns in conversation with occasional min A Baseline:  Goal status: IN PROGRESS  ASSESSMENT:  CLINICAL IMPRESSION: Patient is a 77 y.o. female who was seen today for aphasia. 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 Pt demonstrating improvement with spontaneous verbal productions. Responsive to verbal models. Imitation at word level, continues to improve. Continue to recommend utilization of SGD, as family reports success in communication with device. Continue to recommend skilled ST services.   OBJECTIVE IMPAIRMENTS: include attention, memory, aphasia, and apraxia. These impairments are limiting patient from return to work, managing medications, managing appointments, managing finances, household responsibilities, ADLs/IADLs, and effectively communicating at home and in community. Factors affecting potential to achieve goals and functional outcome are severity of impairments. Patient will benefit from skilled SLP services  to address above impairments and improve overall function.  REHAB POTENTIAL: Good  PLAN:  SLP FREQUENCY: 2x/week  SLP DURATION: 12 weeks  PLANNED INTERVENTIONS: Language facilitation, Environmental controls, Cueing hierachy, Cognitive reorganization, Internal/external aids, Functional tasks, Multimodal communication approach, SLP instruction and feedback, Compensatory strategies, and Patient/family education   Ellyce, La[place], CCC-SLP 01/08/2023, 2:59 PM

## 2023-01-08 NOTE — Therapy (Signed)
OUTPATIENT PHYSICAL THERAPY NEURO TREATMENT NOTE   Patient Name: Kelsey Jacobs MRN: [id number] DOB:01-01-1946, 77 y.o., female Today's Date: 01/08/2023  PCP: Dr. Michell Heinrich [facility] [place]: Levada Schilling, MD  END OF SESSION:  PT End of Session - 01/08/23 1318     Visit Number 11    Number of Visits 17   16+eval   Date for PT Re-Evaluation 02/07/23   pushed out due to potential scheduling delay   Authorization Type MEDICARE PART A AND B    Progress Note Due on Visit 20    PT Start Time 1317    PT Stop Time 1402    PT Time Calculation (min) 45 min    Equipment Utilized During Treatment Gait belt    Activity Tolerance Patient tolerated treatment well    Behavior During Therapy WFL for tasks assessed/performed               Past Medical History:  Diagnosis Date   Medical history non-contributory    Past Surgical History:  Procedure Laterality Date   BACK SURGERY     FOOT SURGERY     INCISION AND DRAINAGE ABSCESS Left 03/29/2021   Procedure: INCISION AND DRAINAGE AXILLARY ABSCESS;  Surgeon: Jovita Kussmaul, MD;  Location: [facility];  Service: General;  Laterality: Left;   Patient Active Problem List   Diagnosis Date Noted   Axillary abscess 03/30/2021    ONSET DATE: 11/28/2022  REFERRING DIAG: R26.9 (ICD-10-CM) - Gait abnormality  THERAPY DIAG:  Muscle weakness (generalized)  Hemiplegia and hemiparesis following cerebral infarction affecting right dominant side ([place])  Other abnormalities of gait and mobility  Unsteadiness on feet  Rationale for Evaluation and Treatment: Rehabilitation  SUBJECTIVE:                                                                                                                                                                                             SUBJECTIVE STATEMENT:   Pt presents with husband this visit who denies falls and patch is still helping right leg pain.  She continues to get  good sleep, but issue is potential fit of CPAP mask (pt presents w/ red closed and dry wound on upper bridge of nose reporting minor pain w/ pressure from glasses or finger).     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 Yes: NPRS scale: 3/10 Pain location: right thigh and knee Pain description: achy, sore Aggravating factors: pt unsure Relieving factors: pt unsure  PRECAUTIONS: Fall-anterior hip precautions have been lifted  WEIGHT BEARING RESTRICTIONS: No  FALLS: Has patient fallen in last 6 months? No  LIVING ENVIRONMENT: Lives with: lives with their spouse and lives with their son-son will be leaving eventually, but is there indefinitely  Lives in: House/apartment Stairs: Yes: Internal: 15 steps; on left going up and External: 2 steps; uses handheld assist and door frame Has following equipment at home: Quad cane small base, Walker - 2 wheeled, bed side commode, Grab bars, and transport chair, chair alarm, bed alarm, call button for son, insertable bed rails  PLOF: Independent with transfers, Needs assistance with ADLs, Needs assistance with homemaking, Needs assistance with gait, and typically has supervision with transfers  PATIENT GOALS: PT asks pt if she would like to get stronger to which she nods.  Son would like for mom to get back to her normal routine safely.  And to be able to leave her alone and know she is safe and for her to transition to the cane just to be a bit more independent.  OBJECTIVE:  -Side stepping at counter w/ green theraband at thighs 2x10' -Forward and backward monster walks at counter w/ green theraband at thighs 4x10', added to HEP w/ edu on improving crouched walk and width of BOS -Seated hamstring stretch x45 sec each LE, discussed and return demo of variation for medial and lateral focus -Long sitting calf  stretch RLE only w/ focus on breathing to relax x 1 minute w/ notable grimace and reporting pain when prompted > changed this to standing gastroc stretch x83minute each LE (PT facilitates appropriate weight shift into stretch and edu on ankle posture to deepen stretch vs hip flexor stretch-to husband and pt) on HEP -Standing hip flexor stretch x45 second each side, pt does well self-initiating switch at appropriate time -Seated SKTC (modified using 6" step and forward lean), pt reports feeling stretch w/ improved pace of breathing compared to prior 2x45 seconds each -Forward tandem 3x10', attempted backwards tandem 2x10' w/ pt benefiting from trials of regular backwards walking to practice left LE clearance past RLE  GAIT: Gait pattern: step through pattern, decreased arm swing- Right, decreased arm swing- Left, decreased step length- Left, decreased stance time- Right, decreased hip/knee flexion- Right, decreased hip/knee flexion- Left, antalgic, and trendelenburg Distance walked: 600' Assistive device utilized: None Level of assistance: SBA Comments: Pt paces activity better today allowing for improved dyspnea on exertion and visibly improved pursed lip breathing.  She does waver to the left with fatigue at increased distance w/ x2 attempts to furniture surf, but corrects as prompted.  PATIENT EDUCATION: Education details: Edu on 20 minute rule for redness > wound and following up with MD/sleep clinic about CPAP mask fit.  Continue HEP w/ updates.   Person educated: Patient and Spouse Education method: Medical illustrator Education comprehension: verbalized understanding, returned demonstration, and needs further education  HOME EXERCISE PROGRAM:  Access Code: [email or web address] URL: [email or web address] Date: 01/08/2023 Prepared by: Camille Bal  Exercises - Seated Hamstring Stretch  - 1 x daily - 5 x weekly - 1 sets - 2-3 reps - 45 seconds hold - Seated Single Knee  to Chest  - 1 x daily - 5 x weekly - 1 sets - 2-3 reps - 45 seconds hold - Standing Hip Flexor Stretch  - 1 x daily - 5 x weekly - 1 sets - 2-3 reps - 45 seconds hold - Standing Hip Extension with Leg Bent and Support  - 1 x  daily - 5 x weekly - 1 sets - 10 reps - Forward Backward Monster Walk with Band at Thighs and Counter Support  - 1 x daily - 5 x weekly - 3 sets - 10 reps - Tandem Walking with Counter Support  - 1 x daily - 5 x weekly - 3 sets - 10 reps - Backward Walking with Counter Support  - 1 x daily - 5 x weekly - 3 sets - 10 reps  GOALS: Goals reviewed with patient? Yes  SHORT TERM GOALS: Target date: 01/03/2023  Pt will perform strength, stretching, and balance HEP with supervision from family to maintain progress. Baseline:  Revised based on current functional level 01/01/2023 Goal status: MET  2.  Increase Berg score to >/ 42/56 to demo improvement in balance. Baseline: 37/56 (1/2); 46/56 (1/24) Goal status: MET  3.  Pt will decrease 5xSTS to </=13 seconds w/ BUE support in order to demonstrate decreased risk for falls and improved functional bilateral LE strength and power. Baseline: 16.86 sec w/ BUE support; 13.47 sec w/ BUE support (01/01/2023) Goal status: IN PROGRESS  4.  Pt will demonstrate a gait speed of >/=2.5 feet/sec using LRAD in order to decrease risk for falls. Baseline: 2.08 ft/sec w/ RW; 2.57 ft/sec w/ RW (1/24) Goal status: MET  5.  Pt will negotiation 2 stairs w/o rail using LRAD at no more than supervision level in order to improve safe accessibility to home environment. Baseline: Pt needs minA at home to access home via garage or front door; son reports they built a rail and pt is safest with this option at this time (01/02/2023) Goal status: REVISED-D/C'd  LONG TERM GOALS: Target date: 01/31/2023  Pt will increase FOTO score to 69% in order to demonstrate subjective functional improvement. Baseline: 54% Goal status: INITIAL  2.  BERG to be assessed  w/ STG/LTG set as appropriate.  Increase Berg score to >/= 47/56 for reduced fall risk.  Baseline: 37/56 on 12-10-22 Goal status: INITIAL  3.  Pt will decrease 5xSTS to </=13 seconds w/o BUE support in order to demonstrate decreased risk for falls and improved functional bilateral LE strength and power. Baseline: 16.86 sec w/ BUE support Goal status: INITIAL  4.  Pt will demonstrate a gait speed of >/=2.98 feet/sec using LRAD in order to decrease risk for falls. Baseline: 2.08 ft/sec w/ RW Goal status: INITIAL  5.  Pt will ambulate >/=500' over level indoor surfaces at modI level to improve endurance and safety with household mobility. Baseline: 125' w/ RW SBA Goal status: INITIAL  ASSESSMENT:  CLINICAL IMPRESSION: Reviewed and updated HEP this session to increase dynamic stability challenge.  Pt most challenged by backwards walking with decreased left foot clearance.  She continues to benefit from skilled PT to improve aerobic tolerance and progress to LRAD for safe functional mobility as able.    OBJECTIVE IMPAIRMENTS: Abnormal gait, decreased activity tolerance, decreased balance, decreased strength, increased muscle spasms, improper body mechanics, and postural dysfunction.   ACTIVITY LIMITATIONS: carrying, lifting, bending, squatting, stairs, transfers, bed mobility, and locomotion level  PARTICIPATION LIMITATIONS: meal prep, cleaning, laundry, medication management, driving, shopping, and community activity  PERSONAL FACTORS: Age, Fitness, Past/current experiences, Transportation, and 1-2 comorbidities: recent left THA and HTN  are also affecting patient's functional outcome.   REHAB POTENTIAL: Good  CLINICAL DECISION MAKING: Stable/uncomplicated  EVALUATION COMPLEXITY: Low  PLAN:  PT FREQUENCY: 2x/week  PT DURATION: 8 weeks  PLANNED INTERVENTIONS: Therapeutic exercises, Therapeutic activity, Neuromuscular re-education, Balance  training, Gait training, Patient/Family  education, Self Care, Joint mobilization, Stair training, Vestibular training, DME instructions, Manual therapy, and Re-evaluation  PLAN FOR NEXT SESSION:   Right NMR, LLE strengthening, balance training-maybe manual dual task, compliant/unlevel surfaces, dynamic tasks, gait training with no device vs RW; stair training, eyes closed, all endurance tasks and BREATHING, trial backwards tandem again, tilt board cone taps, SLS unsupported tasks, aerobic focused tasks   Bary Richard, PT, DPT 01/08/2023, 3:11 PM

## 2023-01-13 ENCOUNTER — Ambulatory Visit: Payer: Medicare Other | Admitting: Speech Pathology

## 2023-01-13 ENCOUNTER — Ambulatory Visit: Payer: Medicare Other | Admitting: Physical Therapy

## 2023-01-13 ENCOUNTER — Encounter: Payer: Medicare Other | Admitting: Speech Pathology

## 2023-01-13 ENCOUNTER — Encounter: Payer: Self-pay | Admitting: Occupational Therapy

## 2023-01-13 ENCOUNTER — Ambulatory Visit: Payer: Medicare Other | Admitting: Occupational Therapy

## 2023-01-15 ENCOUNTER — Encounter: Payer: Self-pay | Admitting: Speech Pathology

## 2023-01-15 ENCOUNTER — Ambulatory Visit: Payer: Medicare Other | Admitting: Occupational Therapy

## 2023-01-15 ENCOUNTER — Encounter: Payer: Self-pay | Admitting: Occupational Therapy

## 2023-01-15 ENCOUNTER — Ambulatory Visit: Payer: Medicare Other | Attending: Nurse Practitioner | Admitting: Speech Pathology

## 2023-01-15 ENCOUNTER — Ambulatory Visit: Payer: Medicare Other

## 2023-01-15 DIAGNOSIS — M6281 Muscle weakness (generalized): Secondary | ICD-10-CM

## 2023-01-15 DIAGNOSIS — R41842 Visuospatial deficit: Secondary | ICD-10-CM | POA: Insufficient documentation

## 2023-01-15 DIAGNOSIS — R2681 Unsteadiness on feet: Secondary | ICD-10-CM

## 2023-01-15 DIAGNOSIS — R2689 Other abnormalities of gait and mobility: Secondary | ICD-10-CM | POA: Diagnosis present

## 2023-01-15 DIAGNOSIS — R41840 Attention and concentration deficit: Secondary | ICD-10-CM | POA: Diagnosis present

## 2023-01-15 DIAGNOSIS — R4701 Aphasia: Secondary | ICD-10-CM | POA: Diagnosis present

## 2023-01-15 DIAGNOSIS — I69351 Hemiplegia and hemiparesis following cerebral infarction affecting right dominant side: Secondary | ICD-10-CM | POA: Diagnosis present

## 2023-01-15 NOTE — Therapy (Signed)
OUTPATIENT OCCUPATIONAL THERAPY NEURO TREATMENT  Patient Name: Kelsey Jacobs MRN: [id number] DOB:February 20, 1946, 77 y.o., female Today's Date: 01/15/2023  PCP: Dr. Vanessa Barbara  REFERRING PROVIDER: Levada Schilling, MD  END OF SESSION:  OT End of Session - 01/15/23 1235     Visit Number 8    Number of Visits 17    Date for OT Re-Evaluation 02/16/23    Authorization Type MCR, UHC    Progress Note Due on Visit 10    OT Start Time 1234    OT Stop Time 1315    OT Time Calculation (min) 41 min    Activity Tolerance Patient tolerated treatment well    Behavior During Therapy WFL for tasks assessed/performed             Past Medical History:  Diagnosis Date   Medical history non-contributory    Past Surgical History:  Procedure Laterality Date   BACK SURGERY     FOOT SURGERY     INCISION AND DRAINAGE ABSCESS Left 03/29/2021   Procedure: INCISION AND DRAINAGE AXILLARY ABSCESS;  Surgeon: Autumn Messing III, MD;  Location: [facility];  Service: General;  Laterality: Left;   Patient Active Problem List   Diagnosis Date Noted   Axillary abscess 03/30/2021    ONSET DATE: 12/04/2022 referral date  REFERRING DIAG:  Diagnosis  I63.512 (ICD-10-CM) - Cerebral infarction due to unspecified occlusion or stenosis of left middle cerebral artery  Z74.09 (ICD-10-CM) - Other reduced mobility  Z78.9 (ICD-10-CM) - Other specified health status  I69.320 (ICD-10-CM) - Aphasia following cerebral infarction    THERAPY DIAG:  Hemiplegia and hemiparesis following cerebral infarction affecting right dominant side (HCC)  Visuospatial deficit  Attention and concentration deficit  Muscle weakness (generalized)  Rationale for Evaluation and Treatment: Rehabilitation  SUBJECTIVE:   SUBJECTIVE STATEMENT: She has been completing tracing and puzzles at home.   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  2/10 BLE - O.T. not addressing  FALLS: Has patient fallen in last 6 months? No  LIVING ENVIRONMENT: Lives with: lives with their spouse and lives with their son-son will be leaving eventually, but is there indefinitely  Lives in: House/apartment (pt lives on first floor)  Stairs: Yes: Internal: 15 steps; on left going up and External: 2 steps; uses handheld assist and door frame Has following equipment at home: Quad cane small base, Walker - 2 wheeled, bed side commode, Grab bars, and transport chair, chair alarm, bed alarm, call button for son, insertable bed rails   PLOF: Independent with transfers, Needs assistance with ADLs, Needs assistance with homemaking, Needs assistance with gait, and typically has supervision with transfers   PATIENT GOALS: PT asks pt if she would like to get stronger to which she nods.  Son would like for mom to get back to her normal routine safely.  And to be able to leave her alone and know she is safe and for her to transition to the cane just to be a bit more independent.    OBJECTIVE: (from evaluation unless otherwise noted)  HAND DOMINANCE: Right  ADLs: Overall ADLs: mostly supervision/cueing, min assist at times Transfers/ambulation related to ADLs: Eating: mod I  Grooming: mod I  UB Dressing: mod I  LB Dressing: min assist to don Lt foot in pants and  occasionally Lt sock, slip on shoes Toileting: mod I (occasional assist for perineal care w/ BM)  Bathing: set up, cues for thoroughness Tub Shower transfers: supervision/cues Equipment:  BSC for shower seat, grab bars  IADLs: Shopping: husband has done for several years Light housekeeping: dependent, has cleaning lady for heavier cleaning 1x/month Meal Prep: dependent at this time Community mobility: not currently driving - relying on family Medication management:  husband administering Financial management: son has been doing Handwriting: 100% legible and print, name only  MOBILITY STATUS:  uses walker   UPPER EXTREMITY ROM:  BUE AROM WNLs   UPPER EXTREMITY MMT:   RUE grossly 4/5, LUE MMT grossly 5/5   HAND FUNCTION: Grip strength: Right: 38.1 lbs; Left: 33.0 lbs  COORDINATION: 9 Hole Peg test: Right: 28 sec; Left: 26 sec  SENSATION: Slightly dulled RUE, pt appears to detect stimuli and localization  EDEMA: none   COGNITION: Overall cognitive status: Difficulty to assess due to: Communication impairment (aphasia). Pt can follow 1 step visual cues/demo  VISION: Subjective report: bluriness since stroke Baseline vision: Wears glasses all the time Visual history: corrective eye surgery  VISION ASSESSMENT: Not tested D/t aphasia   PERCEPTION: Not tested  PRAXIS: Not tested  OBSERVATIONS: Pt mostly limited by Mervin Hack, overall needs sup/min assist for BADLS  TODAY'S TREATMENT:                                                                                                                              - Neuro re-education completed for duration as noted below including: Reviewed theraband HEP - pt demo each w/ min cues  Pt made scrambled egg with supervision and min to mod cues for safety and walker negotiation. Recommended pt begin making sandwiches, making simple familiar stovetop dish with direct supervision, and visual aids to use microwave.  Pt/husband also encouraged to let pt do more of hygiene care after toileting using wet wipes for thoroughness - pt may still need verbal cues Printed out STG's/LTG's for pt/husband and issued so they can work on these goals at home as well.   7 pieces of cleverness game for visual/perceptual skills and cognition - pt could id correct pc but required cueing/assist to orient/turn piece around correctly    PATIENT EDUCATION: Education details:Theraband HEP Person educated: Patient and  Spouse Education method: Explanation, Demonstration, Tactile cues, Verbal cues, and Handouts Education comprehension: verbalized understanding, returned demonstration, verbal cues required, tactile cues required, and needs further education  HOME EXERCISE PROGRAM: 1/12 - dowel HEP 1/31 - RUE yellow theraband    GOALS: Goals reviewed with patient? Yes  SHORT TERM GOALS: Target date: 01/18/23  Independent with HEP for RUE strengthening Baseline: Goal status: MET  2.  Pt to perform LE dressing (donning pants and Lt sock) w/ AE and no more than min cues Baseline:  Goal status: MET  3.  Pt to prepare simple snack, sandwich, or microwaveable items with min cues and  direct sup Baseline:  Goal status: IN PROGRESS  4.  Pt to perform bathing and perineal care with no more than min cues/sup Baseline:  Goal status: IN PROGRESS   LONG TERM GOALS: Target date: 02/16/23  Independent with all BADLS Baseline:  Goal status: IN PROGRESS  2.  Pt to perform familiar stovetop meal with direct supervision and min cues prn Baseline:  Goal status: IN PROGRESS  3.  Pt to consistently perform light IADLS including washing dishes and laundry Baseline:  Goal status: IN PROGRESS   ASSESSMENT:  CLINICAL IMPRESSION: Pt is continuing to make physical gains with improve balance and participation in ADLS. Pt limited by aphasia  PERFORMANCE DEFICITS: in functional skills including ADLs, IADLs, coordination, dexterity, sensation, strength, mobility, body mechanics, decreased knowledge of use of DME, and UE functional use, cognitive skills including attention and safety awareness, and psychosocial skills including coping strategies.   IMPAIRMENTS: are limiting patient from ADLs, IADLs, leisure, and social participation.   CO-MORBIDITIES: may have co-morbidities  that affects occupational performance. Patient will benefit from skilled OT to address above impairments and improve overall  function.  REHAB POTENTIAL: Good  PLAN:  OT FREQUENCY: 2x/week  OT DURATION: 8 weeks, plus evaluation (may only need 6 weeks)   PLANNED INTERVENTIONS: self care/ADL training, therapeutic exercise, therapeutic activity, neuromuscular re-education, balance training, functional mobility training, moist heat, patient/family education, cognitive remediation/compensation, visual/perceptual remediation/compensation, coping strategies training, and DME and/or AE instructions  RECOMMENDED OTHER SERVICES: none at this time  CONSULTED AND AGREED WITH PLAN OF CARE: Patient and family member/caregiver  PLAN FOR NEXT SESSION:  env scanning review; higher level cognition, progress towards remaining goals   Hans Eden, OT 01/15/2023, 12:37 PM

## 2023-01-15 NOTE — Patient Instructions (Signed)
  The device is backed up to [employer] using writing to answer personal questions  If she can't get a word out, see if she can write some letters in the word (it doesn't have  to be perfect, as long as you understand it)  When she is spelling aloud, she often says the wrong letter, but writes the correct letter - don't worry that she says the wrong letter  Accept any form of communication, speaking ,writing, gestures or her device or any combination. The important thing is that she is communicating her thoughts/wants, again, it doesn't have to be perfect if she gets her point across

## 2023-01-15 NOTE — Therapy (Signed)
OUTPATIENT SPEECH LANGUAGE PATHOLOGY TREATMENT   Patient Name: Kelsey Jacobs MRN: [id number] DOB:[birth year], 77 y.o., female 52 Date: 01/15/2023  PCP: Larene Beach MD REFERRING PROVIDER: Gwendolyn Lima, FNP  END OF SESSION:  End of Session - 01/15/23 1308     Visit Number 13    Number of Visits 25    Date for SLP Re-Evaluation 02/19/23    Authorization Type medicare    SLP Start Time 1315    SLP Stop Time  1400    SLP Time Calculation (min) 45 min    Activity Tolerance Patient tolerated treatment well                 Past Medical History:  Diagnosis Date   Medical history non-contributory    Past Surgical History:  Procedure Laterality Date   BACK SURGERY     FOOT SURGERY     INCISION AND DRAINAGE ABSCESS Left 03/29/2021   Procedure: INCISION AND DRAINAGE AXILLARY ABSCESS;  Surgeon: Jovita Kussmaul, MD;  Location: [facility];  Service: General;  Laterality: Left;   Patient Active Problem List   Diagnosis Date Noted   Axillary abscess 03/30/2021    ONSET DATE: 11/06/22   REFERRING DIAG: N23.557 (ICD-10-CM) - Arterial ischemic stroke, MCA (middle cerebral artery), left, acute ([place]) Z74.09 (ICD-10-CM) - Other reduced mobility Z78.9 (ICD-10-CM) - Other specified health status I69.320 (ICD-10-CM) - Aphasia following cerebral infarction  THERAPY DIAG:  Aphasia  Rationale for Evaluation and Treatment: Rehabilitation  SUBJECTIVE:   SUBJECTIVE STATEMENT: Insurance approved SGD - it will ship this week PAIN:  Are you having pain? No  OBJECTIVE:   TODAY'S TREATMENT:    01-15-23: SGD backed up per family request. They continue to report success using SGD for preferences and choices at home. Ongoing training in use of white board on device to augment communication. Shelley wrote names of 5 basic objects to photo 4/5x, with written and verbal cues she corrected error 1/5. Progressed to using written expression at word level to write answers to simple personally  relevant categories. She frequently required visual, auditory and slow rate cues to comprehend the questions. With extended time and usual min A, Jazzlene wrote approximations or accurate word 10/12 trials. She consistently Id'd errors, however she required consistent written fill in the blank cues to self correct. Instructed spouse and son (see patient instructions) to cue Emylie to use writing, accepting written approximations, to augment speech or device. Camrynn demonstrated accurate use of delete icon to clear the white board with occasional min A due to missing the icon and repeated tapping on the space above.   01-08-23: Targeted use of [employer] Touch Talk writing/drawing screen to train in use of this feature to augment communication as well as target written communication. Laine wrote family names to command 5/6x with rare min to mod verbal and written cues. She wrote names of basic objects 10/10 with usual min to mod verbal and written cues. In personally relevant category, Kjersten wrote 10 herbs/spices with consistent written, fill in the blank cues and usual min A (written cues) to ID and correct 4/7 errors. Desire verbally approximate names, objects and category naming with frequent mod to max A of placement cues, choral speech and phrase completion. Trained her to use delete and erase features with frequent mod A. Instructed Al to have [name] in categories (see patient instructions) and to accept if she says a letter wrong, but writes is correctly that this is normal  with aphasia  01-06-23: Pt accompanied by spouse this session. Husband (Al) reported that their son [name]) has been working with [name] using the [employer] device and they've been using it for "everything." SLP re-educated concerning benefits of AAC to support communication. Pt demonstrated independent navigation of device to locate family members. Her verbal productions increased in accuracy following verbal model through device.  Family has updated the device with photos for icons of family members. Provided visual aid (white board), SLP facilitated practice of three word phrases. Pt with ~30% accuracy of speech productions for target phrases (e.g. replacing "want" with "love", consistent apraxic errors). Al stated that pt used to go to wine bars a lot ([name], etc.). She likes to drink red wine, pinot noir. SLP added preferred drink to device to support functional communication. Pt's husband reported that he has been doing her PT exercises with her. Pt has demonstrated strength in auditory comprehension, requiring min A to participate in conversation.    01-01-23: Sahalie demonstrates ability to navigate device to frequently used phrases and topics. Son and husband are encouraging pt to use device to communicate at home. During verbal attempts today, with usual presentation of apraxia impacting intended target, SLP brings awareness to how SGD is helpful in listener determining message and providing a verbal model for pt to imitate. SLP provided suggestions on icon modification to make model appropriate length (single to two words) to aid in successful imitation by pt. Pt's son modifies x3 icons with mod-I following direct instruction. Pt writes and says x3 family members with mod-I, additional x3 with visual and verbal cue (SGD). Is able to imitate 4/8 friends names with usual repetition and cues to reduce rate. Answers questions re: friends in 4/6 trials with single word response.   12-30-22: Gowri continues to engage with SGD independently at home for practice. Ongoing instruction to spouse that he and their son will also have to use SGD with [name] to promote her communication by offering choices and helping her navigate to the topic they need to communicate with her. Demonstrated back up of SGD using name: Meghana Tullo. Trained spouse in adding photos of family and friends rather than generic icons. Targeted verbal  apraxia with use of SGD images, carrier phrase "I like..."  and "I shop    .Marland Kitchen..Marland Kitchen"with consistent max A, Gearldene approximated 6/10 sentences. Using SGD as a speech model, Charles named 4 family and 3 friends. Again used carrier phrase "How is......" and the names of 3 of her friends children to approximate the social question 4/6x with max A.    12-27-22: Pt attends session with Al. Report Nashonda is becoming more interested in using SGD, will use of own volition at home without prompting from care partners. Target accurate response to personal Y/N questions, with pt demonstrating 90% accuracy given support to simple language, repetition, written key words. SGD set to fast talk page with yes, no but pt did not need. Demonstrated confirmation supportive communication strategy to Al, who was able to teach back strategy and why it is useful with mod-I. Discussion regarding preferences for later today and weekend with SGD to support Morgane's verbal communication. Rennae demonstrating navigation between pages to desired icon with mod-I. Usual attempts to repeat after icon with limited success. Given max multi-modal cueing from SLP is able to approximate ~90% of targets. SLP provides education on reducing overall rate to aid in successful verbal communication- not just at word level but in producing phrases/sentences. Pt with  increased accuracy using carrier phrase "I like" + noun when pausing between rote phrase and novel noun.   12-25-22: Targeted multimodal communication using SGD to ID preferences of restaurants and shops. Demonstrated steps to customize device with spouse. Added 4 favorite online shops under "going out" and 2 restaurants ([employer], [employer]) With usual min to mod A, Maysun navigated device to select food then restaurants with cues to correct 2 errors and use to back arrow rather than home button to stay in the same folder when error needs to be corrected. Using SGD, she named 5 family and 2 friends,  repeating after device with limited success. She benefitted from choral production, visual aid, syllabification, simple and direct language to approximate names, stores and restaurants. Added hair cut, mani/pedi icons under going out as well.   12-20-22: Target use of SGD to express wants and needs. Pt able to navigate to appropriate page with rare min-A. Pt selects icon and attempts to verbally repeat. Limited success with verbal repetition, benefits from SLP cues (choral production, visual aid, syllabification, simple and direct language). Identified additional need to add to device; son able to add it independently. Target naming simple category of fruits. Pt generates 0 just given category, 0/2 given semantic cues. Direct confrontation naming pt with 90% accuracy, though apraxic errors consistently present. Max-A multimodal cueing to approximate target. Relative strength in writing target name.   12-18-22: SLP provided pt with their trial Lingraphica device and A in setting per pt's preferences. Provided education to son on how to modify device and how family can employ to A in communication and therapy practice. With verbal cues, pt able to demonstrate navigation of device through various folders and select dictated icon with 100% accuracy. Pt's son endorses confidence in ability to modify device, will begin with device customization per pt preferences prior to next session.    PATIENT EDUCATION: Education details: Sees today's treatment and patient instructions, aphasia ed Person educated: Patient, Spouse, and Child(ren) Education method: Explanation, Demonstration, Verbal cues, and Handouts Education comprehension: verbalized understanding, verbal cues required, and needs further education   GOALS: Goals reviewed with patient? Yes  SHORT TERM GOALS: Target date: 12/25/22  Pt will name 5 family members with occasional mod A Baseline: Goal status: MET  2.  Pt will answer personally relevant  yes/no questions accurately 85% with multimodal communication Baseline:  Goal status: MET  3.  Pt will ID 5 food and shopping preferences using multimodal communication with occasional min A Baseline:  Goal status: MET  4.  Pt will name 6 items in personally relevant categories using AAC/multimodal communication as needed with occasional mod A Baseline:  Goal status: NOT MET  5.  Family/caregivers will carryover 3 communication strategies to support pt's comprehension and expression Baseline:  Goal status: MET   LONG TERM GOALS: Target date: 02/18/22  Pt will initiate use mulitmodal communication to communicate wants/needs 3x over 1 week with usual min A from caregivers Baseline:  Goal status: IN PROGRESS  2.  Pt or caregivers will customize 4 items on AAC system with rare min A  Baseline:  Goal status: IN PROGRESS  3.  Pt will use multimodal communication to communicate 3 grocery items and 3 personal care items she needs with occasional min A from family Baseline:  Goal status: IN PROGRESS  4.  Pt will access online shopping site she uses and purchase a needed item with occasional  min A from family Baseline:  Goal status: IN PROGRESS  5.  Using compensatory strategies and multimodal communication, pt will take 3turns in conversation with occasional min A Baseline:  Goal status: IN PROGRESS  ASSESSMENT:  CLINICAL IMPRESSION: Patient is a 77 y.o. female who was seen today for aphasia. 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 Pt demonstrating improvement with spontaneous verbal productions. Responsive to verbal models. Imitation at word level, continues to improve. Continue to recommend utilization of SGD, as family reports success in communication with device. Continue to recommend skilled ST services.   OBJECTIVE IMPAIRMENTS: include attention,  memory, aphasia, and apraxia. These impairments are limiting patient from return to work, managing medications, managing appointments, managing finances, household responsibilities, ADLs/IADLs, and effectively communicating at home and in community. Factors affecting potential to achieve goals and functional outcome are severity of impairments. Patient will benefit from skilled SLP services to address above impairments and improve overall function.  REHAB POTENTIAL: Good  PLAN:  SLP FREQUENCY: 2x/week  SLP DURATION: 12 weeks  PLANNED INTERVENTIONS: Language facilitation, Environmental controls, Cueing hierachy, Cognitive reorganization, Internal/external aids, Functional tasks, Multimodal communication approach, SLP instruction and feedback, Compensatory strategies, and Patient/family education   Imajean, Mcdermid, CCC-SLP 01/15/2023, 3:02 PM

## 2023-01-15 NOTE — Therapy (Signed)
OUTPATIENT PHYSICAL THERAPY NEURO TREATMENT NOTE   Patient Name: Kelsey Jacobs MRN: [id number] DOB:05-17-1946, 77 y.o., female Today's Date: 01/15/2023  PCP: Dr. Michell Heinrich [facility]) [place]: Levada Schilling, MD  END OF SESSION:  PT End of Session - 01/15/23 1405     Visit Number 12    Number of Visits 17   16+eval   Date for PT Re-Evaluation 02/07/23   pushed out due to potential scheduling delay   Authorization Type MEDICARE PART A AND B    Progress Note Due on Visit 20    PT Start Time 1400    PT Stop Time 1445    PT Time Calculation (min) 45 min    Equipment Utilized During Treatment Gait belt    Activity Tolerance Patient tolerated treatment well    Behavior During Therapy WFL for tasks assessed/performed                Past Medical History:  Diagnosis Date   Medical history non-contributory    Past Surgical History:  Procedure Laterality Date   BACK SURGERY     FOOT SURGERY     INCISION AND DRAINAGE ABSCESS Left 03/29/2021   Procedure: INCISION AND DRAINAGE AXILLARY ABSCESS;  Surgeon: Jovita Kussmaul, MD;  Location: [facility];  Service: General;  Laterality: Left;   Patient Active Problem List   Diagnosis Date Noted   Axillary abscess 03/30/2021    ONSET DATE: 11/28/2022  REFERRING DIAG: R26.9 (ICD-10-CM) - Gait abnormality  THERAPY DIAG:  Muscle weakness (generalized)  Other abnormalities of gait and mobility  Unsteadiness on feet  Rationale for Evaluation and Treatment: Rehabilitation  SUBJECTIVE:                                                                                                                                                                                             SUBJECTIVE STATEMENT:   She is walking with walker 100% of the time at home. No falls or near falls Husband gets up with her at night for safety but she manages well. She is using lidocaine patch at night for pain.  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 No    PRECAUTIONS: Fall-anterior hip precautions have been lifted  WEIGHT BEARING RESTRICTIONS: No  FALLS: Has patient fallen in last 6 months? No  LIVING ENVIRONMENT: Lives with: lives with their spouse and lives with their son-son will be leaving eventually, but is there indefinitely  Lives  in: House/apartment Stairs: Yes: Internal: 15 steps; on left going up and External: 2 steps; uses handheld assist and door frame Has following equipment at home: Quad cane small base, Walker - 2 wheeled, bed side commode, Grab bars, and transport chair, chair alarm, bed alarm, call button for son, insertable bed rails  PLOF: Independent with transfers, Needs assistance with ADLs, Needs assistance with homemaking, Needs assistance with gait, and typically has supervision with transfers  PATIENT GOALS: PT asks pt if she would like to get stronger to which she nods.  Son would like for mom to get back to her normal routine safely.  And to be able to leave her alone and know she is safe and for her to transition to the cane just to be a bit more independent.  OBJECTIVE:   Reviewed seated hamstring stretch Gait training: 1 x 230' 5 x sit to stand: no UE support: 15.45 sec Gait speed: 2.43 ft/sec without AD Performed 6 minute walk test without AD: 870 feet (goal should be >1000 feet) Resisted walking: black sport cord: - resistance pulling in back: 5x fwd and bwd: 10 feet each way - resistance pulling in front: 5x fwd and bwd: 10 feet each way - Resistance pulling to R and to L: 2x each way 10 feet.   Pt and husband educated on trying to use walker for first hour after she wakes up and when she wakes up at night. Throughout the day she should try not using walker, also avoid furniture and wall surfing and husband to provide SBA  when she is walking without AD within home. Both educated that she should trying pushing grocery cart and do 10-15 min of shopping to improve her walking endurance and functional integration.  PATIENT EDUCATION: Education details: Edu on 20 minute rule for redness > wound and following up with MD/sleep clinic about CPAP mask fit.  Continue HEP w/ updates.   Person educated: Patient and Spouse Education method: Customer service manager Education comprehension: verbalized understanding, returned demonstration, and needs further education  HOME EXERCISE PROGRAM:  Access Code: [email or web address] URL: [email or web address] Date: 01/08/2023 Prepared by: Elease Etienne  Exercises - Seated Hamstring Stretch  - 1 x daily - 5 x weekly - 1 sets - 2-3 reps - 45 seconds hold - Seated Single Knee to Chest  - 1 x daily - 5 x weekly - 1 sets - 2-3 reps - 45 seconds hold - Standing Hip Flexor Stretch  - 1 x daily - 5 x weekly - 1 sets - 2-3 reps - 45 seconds hold - Standing Hip Extension with Leg Bent and Support  - 1 x daily - 5 x weekly - 1 sets - 10 reps - Forward Backward Monster Walk with Band at [employer] and Counter Support  - 1 x daily - 5 x weekly - 3 sets - 10 reps - Tandem Walking with Counter Support  - 1 x daily - 5 x weekly - 3 sets - 10 reps - Backward Walking with Counter Support  - 1 x daily - 5 x weekly - 3 sets - 10 reps  GOALS: Goals reviewed with patient? Yes  SHORT TERM GOALS: Target date: 01/03/2023  Pt will perform strength, stretching, and balance HEP with supervision from family to maintain progress. Baseline:  Revised based on current functional level 01/01/2023 Goal status: MET  2.  Increase Berg score to >/ 42/56 to demo improvement in balance. Baseline: 37/56 (1/2); 46/56 (1/24) Goal status: MET  3.  Pt will decrease 5xSTS to </=13 seconds w/ BUE support in order to demonstrate decreased risk for falls and improved functional bilateral LE strength and  power. Baseline: 16.86 sec w/ BUE support; 13.47 sec w/ BUE support (01/01/2023) Goal status: IN PROGRESS  4.  Pt will demonstrate a gait speed of >/=2.5 feet/sec using LRAD in order to decrease risk for falls. Baseline: 2.08 ft/sec w/ RW; 2.57 ft/sec w/ RW (1/24) Goal status: MET  5.  Pt will negotiation 2 stairs w/o rail using LRAD at no more than supervision level in order to improve safe accessibility to home environment. Baseline: Pt needs minA at home to access home via garage or front door; son reports they built a rail and pt is safest with this option at this time (01/02/2023) Goal status: REVISED-D/C'd  LONG TERM GOALS: Target date: 01/31/2023  Pt will increase FOTO score to 69% in order to demonstrate subjective functional improvement. Baseline: 54% Goal status: INITIAL  2.  BERG to be assessed w/ STG/LTG set as appropriate.  Increase Berg score to >/= 47/56 for reduced fall risk.  Baseline: 37/56 on 12-10-22 Goal status: INITIAL  3.  Pt will decrease 5xSTS to </=13 seconds w/o BUE support in order to demonstrate decreased risk for falls and improved functional bilateral LE strength and power. Baseline: 15.45 sec without UE support (01/15/23) Goal status: IN PROGRESS  4.  Pt will demonstrate a gait speed of >/=2.98 feet/sec using LRAD in order to decrease risk for falls. Baseline: 2.08 ft/sec w/ RW; 2.43 ft/sec without AD (01/15/23) Goal status: IN PROGRESS  5.  Pt will ambulate >/=500' over level indoor surfaces at modI level to improve endurance and safety with household mobility. Baseline: 125' w/ RW SBA; 870' without AD and SBA/CGA Goal status: MET  ASSESSMENT:  CLINICAL IMPRESSION: Reassessed goals today. Pt met LTG#5 and making good progress towards her LTG#3 and 4. .    OBJECTIVE IMPAIRMENTS: Abnormal gait, decreased activity tolerance, decreased balance, decreased strength, increased muscle spasms, improper body mechanics, and postural dysfunction.   ACTIVITY  LIMITATIONS: carrying, lifting, bending, squatting, stairs, transfers, bed mobility, and locomotion level  PARTICIPATION LIMITATIONS: meal prep, cleaning, laundry, medication management, driving, shopping, and community activity  PERSONAL FACTORS: Age, Fitness, Past/current experiences, Transportation, and 1-2 comorbidities: recent left THA and HTN  are also affecting patient's functional outcome.   REHAB POTENTIAL: Good  CLINICAL DECISION MAKING: Stable/uncomplicated  EVALUATION COMPLEXITY: Low  PLAN:  PT FREQUENCY: 2x/week  PT DURATION: 8 weeks  PLANNED INTERVENTIONS: Therapeutic exercises, Therapeutic activity, Neuromuscular re-education, Balance training, Gait training, Patient/Family education, Self Care, Joint mobilization, Stair training, Vestibular training, DME instructions, Manual therapy, and Re-evaluation  PLAN FOR NEXT SESSION:   How is walking without walker going? Right NMR, LLE strengthening, balance training-maybe manual dual task, compliant/unlevel surfaces, dynamic tasks, gait training with no device vs RW; stair training, eyes closed, all endurance tasks and BREATHING, trial backwards tandem again, tilt board cone taps, SLS unsupported tasks, aerobic focused tasks   Kerrie Pleasure, PT 01/15/2023, 2:51 PM

## 2023-01-17 ENCOUNTER — Ambulatory Visit: Payer: Medicare Other | Admitting: Physical Therapy

## 2023-01-17 ENCOUNTER — Ambulatory Visit: Payer: Medicare Other | Admitting: Speech Pathology

## 2023-01-17 DIAGNOSIS — R4701 Aphasia: Secondary | ICD-10-CM | POA: Diagnosis not present

## 2023-01-17 NOTE — Therapy (Signed)
OUTPATIENT SPEECH LANGUAGE PATHOLOGY TREATMENT   Patient Name: Kelsey Jacobs MRN: [id number] DOB:[birth year], 77 y.o., female 55 Date: 01/17/2023  PCP: Kelsey Beach MD REFERRING PROVIDER: Gwendolyn Lima, FNP  END OF SESSION:  End of Session - 01/17/23 1318     Visit Number 14    Number of Visits 25    Date for SLP Re-Evaluation 02/19/23    Authorization Type medicare    SLP Start Time 1318    SLP Stop Time  1400    SLP Time Calculation (min) 42 min    Activity Tolerance Patient tolerated treatment well             Past Medical History:  Diagnosis Date   Medical history non-contributory    Past Surgical History:  Procedure Laterality Date   BACK SURGERY     FOOT SURGERY     INCISION AND DRAINAGE ABSCESS Left 03/29/2021   Procedure: INCISION AND DRAINAGE AXILLARY ABSCESS;  Surgeon: Kelsey Kussmaul, MD;  Location: [facility];  Service: General;  Laterality: Left;   Patient Active Problem List   Diagnosis Date Noted   Axillary abscess 03/30/2021    ONSET DATE: 11/06/22   REFERRING DIAG: [id number] (ICD-10-CM) - Arterial ischemic stroke, MCA (middle cerebral artery), left, acute (Kelsey Jacobs) Z74.09 (ICD-10-CM) - Other reduced mobility Z78.9 (ICD-10-CM) - Other specified health status I69.320 (ICD-10-CM) - Aphasia following cerebral infarction  THERAPY DIAG:  Aphasia  Rationale for Evaluation and Treatment: Rehabilitation  SUBJECTIVE:   SUBJECTIVE STATEMENT: "We're doing well" PAIN:  Are you having pain? No  OBJECTIVE:   TODAY'S TREATMENT:    01-17-23: Pt accompanied by spouse. SLP provided assist to backup SGD. Spouse reported that Kelsey pt used to be an avid reader. SLP linked audio book website to Kelsey Jacobs and demonstrated how to navigate Kelsey Kelsey Jacobs app to Kelsey Jacobs. SLP discussed benefits of app because it allows for changing font size to support reading difficulty.  Pt reported that she has been feeling more anxious lately. She experienced overstimulation this  session and yesterday, per husband.  Provided external visual support (white board) and consistent visual cues and models, pt produced targeted one-, two-, and three- syllable combinations with ~35% accuracy. SLP provided model to demonstrate hand tapping out syllables when speaking. Utilized apraxia strategies to improve accurate productions (slow rate, divide into syllables, provide models and visual aids).  Pt upset during session d/t frustration with speech. She endorsed that her communication difficulty is "making me crazy". SLP supported pt with counseling and encouraged her to speak to her doctor about her concerns. Also discussed creating a journal for pt so that her family can help her track her progress/recovery.  01-15-23: SGD backed up per family request. They continue to report success using SGD for preferences and choices at home. Ongoing training in use of white board on device to augment communication. Kelsey Jacobs wrote names of 5 basic objects to photo 4/5x, with written and verbal cues she corrected error 1/5. Progressed to using written expression at word level to write answers to simple personally relevant categories. She frequently required visual, auditory and slow rate cues to comprehend Kelsey questions. With extended time and usual min A, Kelsey Jacobs wrote approximations or accurate word 10/12 trials. She consistently Id'd errors, however she required consistent written fill in Kelsey blank cues to self correct. Instructed spouse and son (see patient instructions) to cue Kelsey Jacobs to use writing, accepting written approximations, to augment speech or device. Kelsey Jacobs demonstrated accurate use of delete icon  to clear Kelsey white board with occasional min A due to missing Kelsey icon and repeated tapping on Kelsey space above.   01-08-23: Targeted use of [employer] writing/drawing screen to train in use of this feature to augment communication as well as target written communication. [name] wrote family names to command  5/6x with rare min to mod verbal and written cues. She wrote names of basic objects 10/10 with usual min to mod verbal and written cues. In personally relevant category, Kelsey Jacobs wrote 10 herbs/spices with consistent written, fill in Kelsey blank cues and usual min A (written cues) to ID and correct 4/7 errors. Kelsey Jacobs verbally approximate names, objects and category naming with frequent mod to max A of placement cues, choral speech and phrase completion. Trained her to use delete and erase features with frequent mod A. Instructed Kelsey Jacobs to have Kelsey Jacobs in categories (see patient instructions) and to accept if she says a letter wrong, but writes is correctly that this is normal with aphasia  01-06-23: Pt accompanied by spouse this session. Husband (Kelsey Jacobs) reported that their son Kelsey Jacobs) has been working with Kelsey Jacobs using [employer]sey [employer] device and they've been using it for "everything." SLP re-educated concerning benefits of AAC to support communication. Pt demonstrated independent navigation of device to locate family members. Her verbal productions increased in accuracy following verbal model through device. Family has updated Kelsey device with photos for icons of family members. Provided visual aid (white board), SLP facilitated practice of three word phrases. Pt with ~30% accuracy of speech productions for target phrases (e.g. replacing "want" with "love", consistent apraxic errors). Kelsey Jacobs stated that pt used to go to wine bars a lot ([employer] Kelsey [employer], etc.). She likes to drink red wine, pinot noir. SLP added preferred drink to device to support functional communication. Pt's husband reported that he has been doing her PT exercises with her. Pt has demonstrated strength in auditory comprehension, requiring min A to participate in conversation.   PATIENT EDUCATION: Education details: Sees today's treatment and patient instructions, aphasia ed Person educated: Patient, Spouse, and Child(ren) Education  method: Explanation, Demonstration, Verbal cues, and Handouts Education comprehension: verbalized understanding, verbal cues required, and needs further education   GOALS: Goals reviewed with patient? Yes  SHORT TERM GOALS: Target date: 12/25/22  Pt will name 5 family members with occasional mod A Baseline: Goal status: MET  2.  Pt will answer personally relevant yes/no questions accurately 85% with multimodal communication Baseline:  Goal status: MET  3.  Pt will ID 5 food and shopping preferences using multimodal communication with occasional min A Baseline:  Goal status: MET  4.  Pt will name 6 items in personally relevant categories using AAC/multimodal communication as needed with occasional mod A Baseline:  Goal status: NOT MET  5.  Family/caregivers will carryover 3 communication strategies to support pt's comprehension and expression Baseline:  Goal status: MET   LONG TERM GOALS: Target date: 02/18/22  Pt will initiate use mulitmodal communication to communicate wants/needs 3x over 1 week with usual min A from caregivers Baseline:  Goal status: IN PROGRESS  2.  Pt or caregivers will customize 4 items on AAC system with rare min A  Baseline:  Goal status: IN PROGRESS  3.  Pt will use multimodal communication to communicate 3 grocery items and 3 personal care items she needs with occasional min A from family Baseline:  Goal status: IN PROGRESS  4.  Pt will access online shopping site she  uses and purchase a needed item with occasional  min A from family Baseline:  Goal status: IN PROGRESS  5.  Using compensatory strategies and multimodal communication, pt will take 3turns in conversation with occasional min A Baseline:  Goal status: IN PROGRESS  ASSESSMENT:  CLINICAL IMPRESSION: Patient is a 77 y.o. female who was seen today for aphasia. 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 Pt demonstrating improvement with spontaneous verbal productions. Responsive to verbal models. Imitation at word level, continues to improve. Continue to recommend utilization of SGD, as family reports success in communication with device. Continue to recommend skilled ST services.   OBJECTIVE IMPAIRMENTS: include attention, memory, aphasia, and apraxia. These impairments are limiting patient from return to work, managing medications, managing appointments, managing finances, household responsibilities, ADLs/IADLs, and effectively communicating at home and in community. Factors affecting potential to achieve goals and functional outcome are severity of impairments. Patient will benefit from skilled SLP services to address above impairments and improve overall function.  REHAB POTENTIAL: Good  PLAN:  SLP FREQUENCY: 2x/week  SLP DURATION: 12 weeks  PLANNED INTERVENTIONS: Language facilitation, Environmental controls, Cueing hierachy, Cognitive reorganization, Internal/external aids, Functional tasks, Multimodal communication approach, SLP instruction and feedback, Compensatory strategies, and Patient/family education   Leroy Libman, Student-SLP 01/17/2023, 1:19 PM

## 2023-01-20 ENCOUNTER — Ambulatory Visit: Payer: Medicare Other | Admitting: Occupational Therapy

## 2023-01-20 ENCOUNTER — Encounter: Payer: Self-pay | Admitting: Occupational Therapy

## 2023-01-20 ENCOUNTER — Encounter: Payer: Self-pay | Admitting: Speech Pathology

## 2023-01-20 ENCOUNTER — Ambulatory Visit: Payer: Medicare Other | Admitting: Speech Pathology

## 2023-01-20 ENCOUNTER — Ambulatory Visit: Payer: Medicare Other | Admitting: Physical Therapy

## 2023-01-20 ENCOUNTER — Encounter: Payer: Self-pay | Admitting: Physical Therapy

## 2023-01-20 DIAGNOSIS — I69351 Hemiplegia and hemiparesis following cerebral infarction affecting right dominant side: Secondary | ICD-10-CM

## 2023-01-20 DIAGNOSIS — R41840 Attention and concentration deficit: Secondary | ICD-10-CM

## 2023-01-20 DIAGNOSIS — R41842 Visuospatial deficit: Secondary | ICD-10-CM

## 2023-01-20 DIAGNOSIS — R4701 Aphasia: Secondary | ICD-10-CM | POA: Diagnosis not present

## 2023-01-20 DIAGNOSIS — M6281 Muscle weakness (generalized): Secondary | ICD-10-CM

## 2023-01-20 DIAGNOSIS — R2681 Unsteadiness on feet: Secondary | ICD-10-CM

## 2023-01-20 DIAGNOSIS — R2689 Other abnormalities of gait and mobility: Secondary | ICD-10-CM

## 2023-01-20 NOTE — Therapy (Signed)
OUTPATIENT OCCUPATIONAL THERAPY NEURO TREATMENT  Patient Name: Kelsey Jacobs MRN: [id number] DOB:[birth year], 77 y.o., female Today's Date: 01/20/2023  PCP: Dr. Vanessa Barbara  REFERRING PROVIDER: Levada Schilling, MD  END OF SESSION:  OT End of Session - 01/20/23 1402     Visit Number 9    Number of Visits 17    Date for OT Re-Evaluation 02/16/23    Authorization Type MCR, UHC    Progress Note Due on Visit 10    OT Start Time 1404    OT Stop Time 1444    OT Time Calculation (min) 40 min    Activity Tolerance Patient tolerated treatment well    Behavior During Therapy WFL for tasks assessed/performed              Past Medical History:  Diagnosis Date   Medical history non-contributory    Past Surgical History:  Procedure Laterality Date   BACK SURGERY     FOOT SURGERY     INCISION AND DRAINAGE ABSCESS Left 03/29/2021   Procedure: INCISION AND DRAINAGE AXILLARY ABSCESS;  Surgeon: Jovita Kussmaul, MD;  Location: [facility];  Service: General;  Laterality: Left;   Patient Active Problem List   Diagnosis Date Noted   Axillary abscess 03/30/2021    ONSET DATE: 12/04/2022 referral date  REFERRING DIAG:  Diagnosis  I63.512 (ICD-10-CM) - Cerebral infarction due to unspecified occlusion or stenosis of left middle cerebral artery  Z74.09 (ICD-10-CM) - Other reduced mobility  Z78.9 (ICD-10-CM) - Other specified health status  I69.320 (ICD-10-CM) - Aphasia following cerebral infarction    THERAPY DIAG:  Hemiplegia and hemiparesis following cerebral infarction affecting right dominant side (HCC)  Visuospatial deficit  Attention and concentration deficit  Muscle weakness (generalized)  Rationale for Evaluation and Treatment: Rehabilitation  SUBJECTIVE:   SUBJECTIVE STATEMENT: She made a salad, prepared something that had to go in the oven, put items away and obtained them. She has been really active in the kitchen though did have an error with the microwave. Her family  has not yet marked the microwave.   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  4/10 BLE - O.T. not addressing  FALLS: Has patient fallen in last 6 months? No  LIVING ENVIRONMENT: Lives with: lives with their spouse and lives with their son-son will be leaving eventually, but is there indefinitely  Lives in: House/apartment (pt lives on first floor)  Stairs: Yes: Internal: 15 steps; on left going up and External: 2 steps; uses handheld assist and door frame Has following equipment at home: Quad cane small base, Walker - 2 wheeled, bed side commode, Grab bars, and transport chair, chair alarm, bed alarm, call button for son, insertable bed rails   PLOF: Independent with transfers, Needs assistance with ADLs, Needs assistance with homemaking, Needs assistance with gait, and typically has supervision with transfers   PATIENT GOALS: PT asks pt if she would like to get stronger to which she nods.  Son would like for mom to get back to her normal routine safely.  And to be able to leave her alone and know she is safe and for her to transition to the cane just to be a bit more independent.    OBJECTIVE: (from evaluation unless otherwise noted)  HAND DOMINANCE: Right  ADLs: Overall  ADLs: mostly supervision/cueing, min assist at times Transfers/ambulation related to ADLs: Eating: mod I  Grooming: mod I  UB Dressing: mod I  LB Dressing: min assist to don Lt foot in pants and occasionally Lt sock, slip on shoes Toileting: mod I (occasional assist for perineal care w/ BM)  Bathing: set up, cues for thoroughness Tub Shower transfers: supervision/cues Equipment:  BSC for shower seat, grab bars  IADLs: Shopping: husband has done for several years Light housekeeping:  dependent, has cleaning lady for heavier cleaning 1x/month Meal Prep: dependent at this time Community mobility: not currently driving - relying on family Medication management: husband administering Financial management: son has been doing Handwriting: 100% legible and print, name only  MOBILITY STATUS:  uses walker   UPPER EXTREMITY ROM:  BUE AROM WNLs   UPPER EXTREMITY MMT:   RUE grossly 4/5, LUE MMT grossly 5/5   HAND FUNCTION: Grip strength: Right: 38.1 lbs; Left: 33.0 lbs  COORDINATION: 9 Hole Peg test: Right: 28 sec; Left: 26 sec  SENSATION: Slightly dulled RUE, pt appears to detect stimuli and localization  EDEMA: none   COGNITION: Overall cognitive status: Difficulty to assess due to: Communication impairment (aphasia). Pt can follow 1 step visual cues/demo  VISION: Subjective report: bluriness since stroke Baseline vision: Wears glasses all the time Visual history: corrective eye surgery  VISION ASSESSMENT: Not tested D/t aphasia   PERCEPTION: Not tested  PRAXIS: Not tested  OBSERVATIONS: Pt mostly limited by Mervin Hack, overall needs sup/min assist for BADLS  TODAY'S TREATMENT:                                                                                                                              - Neuro re-education completed for duration as noted below including: Pt completed 5 word searches on iPad requiring min cueing for proper completion and increased time.  Pt completed 36 piece puzzle on iPad with cues to place pieces with RUE for coordination. 60% accuracy with piece placement requiring encouragement from therapist to finish and increased time.   Attempted traffic puzzle on iPad but was not able to complete.   Completed wooden puzzle x2 with mod cueing for proper completion.   PATIENT EDUCATION: Education details:Theraband HEP Person educated: Patient and Spouse Education method: Explanation, Demonstration, Tactile cues, Verbal cues, and  Handouts Education comprehension: verbalized understanding, returned demonstration, verbal cues required, tactile cues required, and needs further education  HOME EXERCISE PROGRAM: 1/12 - dowel HEP 1/31 - RUE yellow theraband    GOALS: Goals reviewed with patient? Yes  SHORT TERM GOALS: Target date: 01/18/23  Independent with HEP for RUE strengthening Baseline: Goal status: MET  2.  Pt to perform LE dressing (donning pants and Lt sock) w/ AE and no more than min cues Baseline:  Goal status: MET  3.  Pt to prepare simple snack, sandwich, or microwaveable items with min cues and direct sup Baseline:  Goal status: IN PROGRESS  4.  Pt to perform bathing and perineal care with no more than min cues/sup Baseline:  Goal status: IN PROGRESS   LONG TERM GOALS: Target date: 02/16/23  Independent with all BADLS Baseline:  Goal status: IN PROGRESS  2.  Pt to perform familiar stovetop meal with direct supervision and min cues prn Baseline:  Goal status: IN PROGRESS  3.  Pt to consistently perform light IADLS including washing dishes and laundry Baseline:  01/20/2023 - has been doing laundry and putting away dishes (uses dishwasher) Goal status: IN PROGRESS   ASSESSMENT:  CLINICAL IMPRESSION: Pt is continuing to make physical gains with improve balance and participation in ADLS. Pt limited by aphasia  PERFORMANCE DEFICITS: in functional skills including ADLs, IADLs, coordination, dexterity, sensation, strength, mobility, body mechanics, decreased knowledge of use of DME, and UE functional use, cognitive skills including attention and safety awareness, and psychosocial skills including coping strategies.   IMPAIRMENTS: are limiting patient from ADLs, IADLs, leisure, and social participation.   CO-MORBIDITIES: may have co-morbidities  that affects occupational performance. Patient will benefit from skilled OT to address above impairments and improve overall function.  REHAB  POTENTIAL: Good  PLAN:  OT FREQUENCY: 2x/week  OT DURATION: 8 weeks, plus evaluation (may only need 6 weeks)   PLANNED INTERVENTIONS: self care/ADL training, therapeutic exercise, therapeutic activity, neuromuscular re-education, balance training, functional mobility training, moist heat, patient/family education, cognitive remediation/compensation, visual/perceptual remediation/compensation, coping strategies training, and DME and/or AE instructions  RECOMMENDED OTHER SERVICES: none at this time  CONSULTED AND AGREED WITH PLAN OF CARE: Patient and family member/caregiver  PLAN FOR NEXT SESSION:  env scanning review; higher level cognition, progress towards remaining goals   Dennis Bast, OT 01/20/2023, 5:33 PM

## 2023-01-20 NOTE — Therapy (Signed)
OUTPATIENT SPEECH LANGUAGE PATHOLOGY TREATMENT   Patient Name: Kelsey Jacobs MRN: [id number] DOB:03/03/1946, 77 y.o., female 26 Date: 01/20/2023  PCP: Kelsey Beach MD REFERRING PROVIDER: Gwendolyn Lima, FNP  END OF SESSION:  End of Session - 01/20/23 1310     Visit Number 14    Number of Visits 25    Date for SLP Re-Evaluation 02/19/23    Authorization Type medicare    SLP Start Time 1315    SLP Stop Time  1400    SLP Time Calculation (min) 45 min    Activity Tolerance Patient tolerated treatment well             Past Medical History:  Diagnosis Date   Medical history non-contributory    Past Surgical History:  Procedure Laterality Date   BACK SURGERY     FOOT SURGERY     INCISION AND DRAINAGE ABSCESS Left 03/29/2021   Procedure: INCISION AND DRAINAGE AXILLARY ABSCESS;  Surgeon: Kelsey Kussmaul, MD;  Location: [facility];  Service: General;  Laterality: Left;   Patient Active Problem List   Diagnosis Date Noted   Axillary abscess 03/30/2021    ONSET DATE: 11/06/22   REFERRING DIAG: [id number] (ICD-10-CM) - Arterial ischemic stroke, MCA (middle cerebral artery), left, acute ([place]) Z74.09 (ICD-10-CM) - Other reduced mobility Z78.9 (ICD-10-CM) - Other specified health status I69.320 (ICD-10-CM) - Aphasia following cerebral infarction  THERAPY DIAG:  Aphasia  Rationale for Evaluation and Treatment: Rehabilitation  SUBJECTIVE:   SUBJECTIVE STATEMENT: "We're doing well" PAIN:  Are you having pain? No  OBJECTIVE:   TODAY'S TREATMENT:   01-20-23: Pt received her Touch Talk and brought it in today. She used SGD to tell me where she ate with her friend and we added another restaurant she frequents. Using SGD she Id'd 3 friends she has seen the past week.  Kelsey Jacobs used Teacher, English as a foreign language (writing, SGD and speech at word level) to ask about extending her visits with rare min A to use writing. Targeted word finding and verbal expression generating list of 12  items in personally relevant category (make up) with consistent gestures, written cues for auditory comprehension. She repeated each item with placement cues 3x, 9/12 words. Kelsey Jacobs reports successful use of SGD and written communication at home with family support to augment verbal expression. She wrote (approximated) 3 brands of personal care items and Kelsey Jacobs Id'd them quickly. Kelsey Jacobs continues to benefit from encouragement to expand her communicative settings. Instructed Kelsey Jacobs (see patient instructions) to practice with her doing internet searches to her favorite stores and texting.   01-17-23: Pt accompanied by spouse. SLP provided assist to backup SGD. Spouse reported that the pt used to be an avid reader. SLP linked audio book website to [place] and demonstrated how to navigate the [place] app to [name]. SLP discussed benefits of app because it allows for changing font size to support reading difficulty.  Pt reported that she has been feeling more anxious lately. She experienced overstimulation this session and yesterday, per husband.  Provided external visual support (white board) and consistent visual cues and models, pt produced targeted one-, two-, and three- syllable combinations with ~35% accuracy. SLP provided model to demonstrate hand tapping out syllables when speaking. Utilized apraxia strategies to improve accurate productions (slow rate, divide into syllables, provide models and visual aids).  Pt upset during session d/t frustration with speech. She endorsed that her communication difficulty is "making me crazy". SLP supported pt with counseling and encouraged her to speak  to her doctor about her concerns. Also discussed creating a journal for pt so that her family can help her track her progress/recovery.  01-15-23: SGD backed up per family request. They continue to report success using SGD for preferences and choices at home. Ongoing training in use of white board on device to augment communication. Kelsey Jacobs  wrote names of 5 basic objects to photo 4/5x, with written and verbal cues she corrected error 1/5. Progressed to using written expression at word level to write answers to simple personally relevant categories. She frequently required visual, auditory and slow rate cues to comprehend the questions. With extended time and usual min A, Kelsey Jacobs wrote approximations or accurate word 10/12 trials. She consistently Id'd errors, however she required consistent written fill in the blank cues to self correct. Instructed spouse and son (see patient instructions) to cue Kelsey Jacobs to use writing, accepting written approximations, to augment speech or device. Kelsey Jacobs demonstrated accurate use of delete icon to clear the white board with occasional min A due to missing the icon and repeated tapping on the space above.   01-08-23: Targeted use of [employer] Touch Talk writing/drawing screen to train in use of this feature to augment communication as well as target written communication. Kelsey Jacobs wrote family names to command 5/6x with rare min to mod verbal and written cues. She wrote names of basic objects 10/10 with usual min to mod verbal and written cues. In personally relevant category, Ily wrote 10 herbs/spices with consistent written, fill in the blank cues and usual min A (written cues) to ID and correct 4/7 errors. Kelsey Jacobs verbally approximate names, objects and category naming with frequent mod to max A of placement cues, choral speech and phrase completion. Trained her to use delete and erase features with frequent mod A. Instructed Kelsey Jacobs to have [name] in categories (see patient instructions) and to accept if she says a letter wrong, but writes is correctly that this is normal with aphasia  01-06-23: Pt accompanied by spouse this session. Husband (Kelsey Jacobs) reported that their son Kelsey Jacobs) has been working with Kelsey Jacobs using the [employer] device and they've been using it for "everything." SLP re-educated concerning benefits of  AAC to support communication. Pt demonstrated independent navigation of device to locate family members. Her verbal productions increased in accuracy following verbal model through device. Family has updated the device with photos for icons of family members. Provided visual aid (white board), SLP facilitated practice of three word phrases. Pt with ~30% accuracy of speech productions for target phrases (e.g. replacing "want" with "love", consistent apraxic errors). Kelsey Jacobs stated that pt used to go to wine bars a lot ([name], etc.). She likes to drink red wine, pinot noir. SLP added preferred drink to device to support functional communication. Pt's husband reported that he has been doing her PT exercises with her. Pt has demonstrated strength in auditory comprehension, requiring min A to participate in conversation.   PATIENT EDUCATION: Education details: Sees today's treatment and patient instructions, aphasia ed Person educated: Patient, Spouse, and Child(ren) Education method: Explanation, Demonstration, Verbal cues, and Handouts Education comprehension: verbalized understanding, verbal cues required, and needs further education   GOALS: Goals reviewed with patient? Yes  SHORT TERM GOALS: Target date: 12/25/22  Pt will name 5 family members with occasional mod A Baseline: Goal status: MET  2.  Pt will answer personally relevant yes/no questions accurately 85% with multimodal communication Baseline:  Goal status: MET  3.  Pt will ID  5 food and shopping preferences using multimodal communication with occasional min A Baseline:  Goal status: MET  4.  Pt will name 6 items in personally relevant categories using AAC/multimodal communication as needed with occasional mod A Baseline:  Goal status: NOT MET  5.  Family/caregivers will carryover 3 communication strategies to support pt's comprehension and expression Baseline:  Goal status: MET   LONG TERM GOALS: Target  date: 02/18/22  Pt will initiate use mulitmodal communication to communicate wants/needs 3x over 1 week with usual min A from caregivers Baseline:  Goal status: IN PROGRESS  2.  Pt or caregivers will customize 4 items on AAC system with rare min A  Baseline:  Goal status: IN PROGRESS  3.  Pt will use multimodal communication to communicate 3 grocery items and 3 personal care items she needs with occasional min A from family Baseline:  Goal status: IN PROGRESS  4.  Pt will access online shopping site she uses and purchase a needed item with occasional  min A from family Baseline:  Goal status: IN PROGRESS  5.  Using compensatory strategies and multimodal communication, pt will take 3turns in conversation with occasional min A Baseline:  Goal status: IN PROGRESS  ASSESSMENT:  CLINICAL IMPRESSION: Patient is a 77 y.o. female who was seen today for aphasia. 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 Pt demonstrating improvement with spontaneous verbal productions. Responsive to verbal models. Imitation at word level, continues to improve. Continue to recommend utilization of SGD, as family reports success in communication with device. Continue to recommend skilled ST services.   OBJECTIVE IMPAIRMENTS: include attention, memory, aphasia, and apraxia. These impairments are limiting patient from return to work, managing medications, managing appointments, managing finances, household responsibilities, ADLs/IADLs, and effectively communicating at home and in community. Factors affecting potential to achieve goals and functional outcome are severity of impairments. Patient will benefit from skilled SLP services to address above impairments and improve overall function.  REHAB POTENTIAL: Good  PLAN:  SLP FREQUENCY: 2x/week  SLP DURATION: 12 weeks  PLANNED  INTERVENTIONS: Language facilitation, Environmental controls, Cueing hierachy, Cognitive reorganization, Internal/external aids, Functional tasks, Multimodal communication approach, SLP instruction and feedback, Compensatory strategies, and Patient/family education   Arynn, [place], CCC-SLP 01/20/2023, 2:56 PM

## 2023-01-20 NOTE — Therapy (Unsigned)
OUTPATIENT PHYSICAL THERAPY NEURO TREATMENT NOTE   Patient Name: Kelsey Jacobs MRN: [id number] DOB:1946-01-01, 77 y.o., female Today's Date: 01/20/2023  PCP: Dr. Michell Heinrich [facility]) [place]: Levada Schilling, MD  END OF SESSION:  PT End of Session - 01/20/23 1527     Visit Number 13    Number of Visits 17   16+eval   Date for PT Re-Evaluation 02/07/23   pushed out due to potential scheduling delay   Authorization Type MEDICARE PART A AND B    Progress Note Due on Visit 20    PT Start Time 1447    PT Stop Time 1527    PT Time Calculation (min) 40 min    Equipment Utilized During Treatment Gait belt    Activity Tolerance Patient tolerated treatment well    Behavior During Therapy WFL for tasks assessed/performed                 Past Medical History:  Diagnosis Date   Medical history non-contributory    Past Surgical History:  Procedure Laterality Date   BACK SURGERY     FOOT SURGERY     INCISION AND DRAINAGE ABSCESS Left 03/29/2021   Procedure: INCISION AND DRAINAGE AXILLARY ABSCESS;  Surgeon: Jovita Kussmaul, MD;  Location: [facility];  Service: General;  Laterality: Left;   Patient Active Problem List   Diagnosis Date Noted   Axillary abscess 03/30/2021    ONSET DATE: 11/28/2022  REFERRING DIAG: R26.9 (ICD-10-CM) - Gait abnormality  THERAPY DIAG:  No diagnosis found.  Rationale for Evaluation and Treatment: Rehabilitation  SUBJECTIVE:                                                                                                                                                                                             SUBJECTIVE STATEMENT:   She is walking w/ cane ~90% of the time, using RW only when tired or at bedtime for safety.  Husband denies any issues sequencing the cane.  She denies falls or acute changes.  Husband states statin has been cut in half.  Pt is using cane w/ 6 pt rubber tip.  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 YES 4/10 Right foot Pins and needles Intermittent Unsure what aggravates or improves.    PRECAUTIONS: Fall-anterior hip precautions have been lifted  WEIGHT BEARING RESTRICTIONS: No  FALLS: Has patient fallen in last 6 months? No  LIVING ENVIRONMENT: Lives with: lives with  their spouse and lives with their son-son will be leaving eventually, but is there indefinitely  Lives in: House/apartment Stairs: Yes: Internal: 15 steps; on left going up and External: 2 steps; uses handheld assist and door frame Has following equipment at home: Quad cane small base, Walker - 2 wheeled, bed side commode, Grab bars, and transport chair, chair alarm, bed alarm, call button for son, insertable bed rails  PLOF: Independent with transfers, Needs assistance with ADLs, Needs assistance with homemaking, Needs assistance with gait, and typically has supervision with transfers  PATIENT GOALS: PT asks pt if she would like to get stronger to which she nods.  Son would like for mom to get back to her normal routine safely.  And to be able to leave her alone and know she is safe and for her to transition to the cane just to be a bit more independent.  OBJECTIVE:  -Pt ambulates modI w/ cane (6pt rubber tip) x805' in 6 minutes, max encouragement to complete last lap, cued for pursed lip breathing to prolong endurance; seated rest following Blaze pods 3 x1 minute rounds w/ 30 seconds rest between sets progressed as follows: -Round 1 using 3 pods on mirror and 3 on ground -Round 2 and 3 using 2 mirrors for increased reflection distraction and 3 pods on ground for increased dynamic mobility and motor planning challenge -Seated on green physioball using 2lb yellow dowel rod to tap 3 pods in semi-circle outside BOS at waist height 1x70mnute round, 30  seconds rest, followed by second 1 minute round using secondary distracting color and increased augmented feedback to associate colors and promote success w/ task w/ pt having 6 inaccurate taps of 29 total.  Pt requesting to rest on mat following task. -Pt requesting to ambulate 2 laps indicating need for posterior hip stretch w/ task (230') w/o AD, supervision level, no overt LOB  PATIENT EDUCATION: Education details: Continue reducing time w/ RW w/ use of cane as needed.  Person educated: Patient and Spouse Education method: ECustomer service managerEducation comprehension: verbalized understanding, returned demonstration, and needs further education  HOME EXERCISE PROGRAM:  Access Code: [email or web address] URL: [email or web address] Date: 01/08/2023 Prepared by: MElease Etienne Exercises - Seated Hamstring Stretch  - 1 x daily - 5 x weekly - 1 sets - 2-3 reps - 45 seconds hold - Seated Single Knee to Chest  - 1 x daily - 5 x weekly - 1 sets - 2-3 reps - 45 seconds hold - Standing Hip Flexor Stretch  - 1 x daily - 5 x weekly - 1 sets - 2-3 reps - 45 seconds hold - Standing Hip Extension with Leg Bent and Support  - 1 x daily - 5 x weekly - 1 sets - 10 reps - Forward Backward Monster Walk with Band at T[employer]and Counter Support  - 1 x daily - 5 x weekly - 3 sets - 10 reps - Tandem Walking with Counter Support  - 1 x daily - 5 x weekly - 3 sets - 10 reps - Backward Walking with Counter Support  - 1 x daily - 5 x weekly - 3 sets - 10 reps  GOALS: Goals reviewed with patient? Yes  SHORT TERM GOALS: Target date: 01/03/2023  Pt will perform strength, stretching, and balance HEP with supervision from family to maintain progress. Baseline:  Revised based on current functional level 01/01/2023 Goal status: MET  2.  Increase Berg score to >/ 42/56 to demo improvement in balance. Baseline:  37/56 (1/2); 46/56 (1/24) Goal status: MET  3.  Pt will decrease 5xSTS to </=13 seconds w/  BUE support in order to demonstrate decreased risk for falls and improved functional bilateral LE strength and power. Baseline: 16.86 sec w/ BUE support; 13.47 sec w/ BUE support (01/01/2023) Goal status: IN PROGRESS  4.  Pt will demonstrate a gait speed of >/=2.5 feet/sec using LRAD in order to decrease risk for falls. Baseline: 2.08 ft/sec w/ RW; 2.57 ft/sec w/ RW (1/24) Goal status: MET  5.  Pt will negotiation 2 stairs w/o rail using LRAD at no more than supervision level in order to improve safe accessibility to home environment. Baseline: Pt needs minA at home to access home via garage or front door; son reports they built a rail and pt is safest with this option at this time (01/02/2023) Goal status: REVISED-D/C'd  LONG TERM GOALS: Target date: 01/31/2023  Pt will increase FOTO score to 69% in order to demonstrate subjective functional improvement. Baseline: 54% Goal status: INITIAL  2.  BERG to be assessed w/ STG/LTG set as appropriate.  Increase Berg score to >/= 47/56 for reduced fall risk.  Baseline: 37/56 on 12-10-22 Goal status: INITIAL  3.  Pt will decrease 5xSTS to </=13 seconds w/o BUE support in order to demonstrate decreased risk for falls and improved functional bilateral LE strength and power. Baseline: 15.45 sec without UE support (01/15/23) Goal status: IN PROGRESS  4.  Pt will demonstrate a gait speed of >/=2.98 feet/sec using LRAD in order to decrease risk for falls. Baseline: 2.08 ft/sec w/ RW; 2.43 ft/sec without AD (01/15/23) Goal status: IN PROGRESS  5.  Pt will ambulate >/=500' over level indoor surfaces at modI level to improve endurance and safety with household mobility. Baseline: 125' w/ RW SBA; 870' without AD and SBA/CGA Goal status: MET  ASSESSMENT:  CLINICAL IMPRESSION: Focus of skilled session on addressing dynamic stability, motor planning, and coordination with increased levels of distraction.  Pt does well with blaze pods tasks completed this visit  needing increased feedback to accurately tap correct color pods when distracting color added.  Will continue to progress task to incorporate this dual tasking component among other cognitive challenges.  OBJECTIVE IMPAIRMENTS: Abnormal gait, decreased activity tolerance, decreased balance, decreased strength, increased muscle spasms, improper body mechanics, and postural dysfunction.   ACTIVITY LIMITATIONS: carrying, lifting, bending, squatting, stairs, transfers, bed mobility, and locomotion level  PARTICIPATION LIMITATIONS: meal prep, cleaning, laundry, medication management, driving, shopping, and community activity  PERSONAL FACTORS: Age, Fitness, Past/current experiences, Transportation, and 1-2 comorbidities: recent left THA and HTN  are also affecting patient's functional outcome.   REHAB POTENTIAL: Good  CLINICAL DECISION MAKING: Stable/uncomplicated  EVALUATION COMPLEXITY: Low  PLAN:  PT FREQUENCY: 2x/week  PT DURATION: 8 weeks  PLANNED INTERVENTIONS: Therapeutic exercises, Therapeutic activity, Neuromuscular re-education, Balance training, Gait training, Patient/Family education, Self Care, Joint mobilization, Stair training, Vestibular training, DME instructions, Manual therapy, and Re-evaluation  PLAN FOR NEXT SESSION:   How is walking without walker going? Right NMR, LLE strengthening, balance training-maybe manual dual task, compliant/unlevel surfaces, dynamic tasks, gait training with no device vs RW; stair training, eyes closed, all endurance tasks and BREATHING, trial backwards tandem again, tilt board cone taps, SLS unsupported tasks, aerobic focused tasks   Bary Richard, PT, DPT 01/20/2023, 3:28 PM

## 2023-01-20 NOTE — Patient Instructions (Signed)
   Great job using writing to communicate!  HW: Financial risk analyst on tablet, look up [facility] and find your order history -   Look up [employer] and find your make up - [name] can help but try to get independent in using tablet the way used to  Next session wear 2 kinds of make up (blush, mascara, lipstick, brow - pick 2)  You are doing great going out and seeing friends - I know this is hard but you have to keep trying and going out with people - good job  Practice texting even if it is a rote response that you can send people  If you want to talk to a friend, use Face Time and tell them right off you can only talk for 10 minutes

## 2023-01-22 ENCOUNTER — Ambulatory Visit: Payer: Medicare Other | Admitting: Occupational Therapy

## 2023-01-22 ENCOUNTER — Encounter: Payer: Self-pay | Admitting: Occupational Therapy

## 2023-01-22 ENCOUNTER — Encounter: Payer: Self-pay | Admitting: Physical Therapy

## 2023-01-22 ENCOUNTER — Ambulatory Visit: Payer: Medicare Other | Admitting: Physical Therapy

## 2023-01-22 ENCOUNTER — Ambulatory Visit: Payer: Medicare Other | Admitting: Speech Pathology

## 2023-01-22 DIAGNOSIS — M6281 Muscle weakness (generalized): Secondary | ICD-10-CM

## 2023-01-22 DIAGNOSIS — R41840 Attention and concentration deficit: Secondary | ICD-10-CM

## 2023-01-22 DIAGNOSIS — R2681 Unsteadiness on feet: Secondary | ICD-10-CM

## 2023-01-22 DIAGNOSIS — R41842 Visuospatial deficit: Secondary | ICD-10-CM

## 2023-01-22 DIAGNOSIS — R2689 Other abnormalities of gait and mobility: Secondary | ICD-10-CM

## 2023-01-22 DIAGNOSIS — I69351 Hemiplegia and hemiparesis following cerebral infarction affecting right dominant side: Secondary | ICD-10-CM

## 2023-01-22 DIAGNOSIS — R4701 Aphasia: Secondary | ICD-10-CM

## 2023-01-22 NOTE — Patient Instructions (Addendum)
   Add the travel folder to her home page and customize to where she travels to - you can delete states she has no interest in, add countries and sights   Keep up practicing writing to help you communicate

## 2023-01-22 NOTE — Therapy (Signed)
OUTPATIENT PHYSICAL THERAPY NEURO TREATMENT NOTE   Patient Name: Kelsey Jacobs MRN: [id number] DOB:[birth year], 77 y.o., female Today's Date: 01/22/2023  PCP: Dr. Michell Heinrich [facility] at [facility]) [place]: Levada Schilling, MD  END OF SESSION:  01/22/23 1449  PT Visits / Re-Eval  Visit Number 14  Number of Visits 17 (16+eval)  Date for PT Re-Evaluation 02/07/23 (pushed out due to potential scheduling delay)  Authorization  Authorization Type MEDICARE PART A AND B  Progress Note Due on Visit 20  PT Time Calculation  PT Start Time 1447  PT Stop Time 1511  PT Time Calculation (min) 24 min  PT - End of Session  Equipment Utilized During Treatment Gait belt  Activity Tolerance Patient tolerated treatment well;Patient limited by fatigue  Behavior During Therapy WFL for tasks assessed/performed        Past Medical History:  Diagnosis Date   Medical history non-contributory    Past Surgical History:  Procedure Laterality Date   BACK SURGERY     FOOT SURGERY     INCISION AND DRAINAGE ABSCESS Left 03/29/2021   Procedure: INCISION AND DRAINAGE AXILLARY ABSCESS;  Surgeon: Jovita Kussmaul, MD;  Location: [facility];  Service: General;  Laterality: Left;   Patient Active Problem List   Diagnosis Date Noted   Axillary abscess 03/30/2021    ONSET DATE: 11/28/2022  REFERRING DIAG: R26.9 (ICD-10-CM) - Gait abnormality  THERAPY DIAG:  Hemiplegia and hemiparesis following cerebral infarction affecting right dominant side ([place])  Other abnormalities of gait and mobility  Unsteadiness on feet  Muscle weakness (generalized)  Rationale for Evaluation and Treatment: Rehabilitation  SUBJECTIVE:                                                                                                                                                                                             SUBJECTIVE STATEMENT:   She continues walking w/ cane ~90-95% of the time, using  RW only when tired or at bedtime for safety.  She denies falls or other acute changes.  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 YES 2/10 Right lower leg Sore Intermittent Unsure what aggravates or improves.    PRECAUTIONS: Fall-anterior hip precautions have been lifted  WEIGHT BEARING RESTRICTIONS: No  FALLS: Has patient fallen in last 6 months? No  LIVING ENVIRONMENT: Lives with: lives with their spouse and lives with their son-son will be leaving eventually, but is there indefinitely  Lives in: House/apartment Stairs: Yes: Internal: 48  steps; on left going up and External: 2 steps; uses handheld assist and door frame Has following equipment at home: Quad cane small base, Walker - 2 wheeled, bed side commode, Grab bars, and transport chair, chair alarm, bed alarm, call button for son, insertable bed rails  PLOF: Independent with transfers, Needs assistance with ADLs, Needs assistance with homemaking, Needs assistance with gait, and typically has supervision with transfers  PATIENT GOALS: PT asks pt if she would like to get stronger to which she nods.  Son would like for mom to get back to her normal routine safely.  And to be able to leave her alone and know she is safe and for her to transition to the cane just to be a bit more independent.  OBJECTIVE:  -Pt preference to walk unassisted at onset of session.  Pt ambulates SBA w/o AD x400', notable decreased stance time on RLE causing x2 instances of left LOB w/o need for physical assist to correct. -2lb yellow dowel rod forward bend to overhead press x6 > 8" cone target into overhead raise x8, encouraged increased knee bend when pushing to low target -Deadlift to 8" step w/ 5lb kettlebell 2x8 -Standing red star ball taps x 2lb dowel rod normal BOS x2 minutes for standing  anticipatory balance and endurance Pt indicates severe fatigue following task.  Session concluded early per pt request.  PATIENT EDUCATION: Education details: Continue reducing time w/ RW w/ use of cane as needed.  Person educated: Patient and Spouse Education method: Customer service manager Education comprehension: verbalized understanding, returned demonstration, and needs further education  HOME EXERCISE PROGRAM:  Access Code: [email or web address] URL: [email or web address] Date: 01/08/2023 Prepared by: Elease Etienne  Exercises - Seated Hamstring Stretch  - 1 x daily - 5 x weekly - 1 sets - 2-3 reps - 45 seconds hold - Seated Single Knee to Chest  - 1 x daily - 5 x weekly - 1 sets - 2-3 reps - 45 seconds hold - Standing Hip Flexor Stretch  - 1 x daily - 5 x weekly - 1 sets - 2-3 reps - 45 seconds hold - Standing Hip Extension with Leg Bent and Support  - 1 x daily - 5 x weekly - 1 sets - 10 reps - Forward Backward Monster Walk with Band at [employer] and Counter Support  - 1 x daily - 5 x weekly - 3 sets - 10 reps - Tandem Walking with Counter Support  - 1 x daily - 5 x weekly - 3 sets - 10 reps - Backward Walking with Counter Support  - 1 x daily - 5 x weekly - 3 sets - 10 reps  GOALS: Goals reviewed with patient? Yes  SHORT TERM GOALS: Target date: 01/03/2023  Pt will perform strength, stretching, and balance HEP with supervision from family to maintain progress. Baseline:  Revised based on current functional level 01/01/2023 Goal status: MET  2.  Increase Berg score to >/ 42/56 to demo improvement in balance. Baseline: 37/56 (1/2); 46/56 (1/24) Goal status: MET  3.  Pt will decrease 5xSTS to </=13 seconds w/ BUE support in order to demonstrate decreased risk for falls and improved functional bilateral LE strength and power. Baseline: 16.86 sec w/ BUE support; 13.47 sec w/ BUE support (01/01/2023) Goal status: IN PROGRESS  4.  Pt will demonstrate a gait speed of  >/=2.5 feet/sec using LRAD in order to decrease risk for falls. Baseline: 2.08 ft/sec w/ RW; 2.57 ft/sec w/ RW (1/24) Goal status: MET  5.  Pt will negotiation 2 stairs w/o rail using LRAD at no more than supervision level in order to improve safe accessibility to home environment. Baseline: Pt needs minA at home to access home via garage or front door; son reports they built a rail and pt is safest with this option at this time (01/02/2023) Goal status: REVISED-D/C'd  LONG TERM GOALS: Target date: 01/31/2023  Pt will increase FOTO score to 69% in order to demonstrate subjective functional improvement. Baseline: 54% Goal status: INITIAL  2.  BERG to be assessed w/ STG/LTG set as appropriate.  Increase Berg score to >/= 47/56 for reduced fall risk.  Baseline: 37/56 on 12-10-22 Goal status: INITIAL  3.  Pt will decrease 5xSTS to </=13 seconds w/o BUE support in order to demonstrate decreased risk for falls and improved functional bilateral LE strength and power. Baseline: 15.45 sec without UE support (01/15/23) Goal status: IN PROGRESS  4.  Pt will demonstrate a gait speed of >/=2.98 feet/sec using LRAD in order to decrease risk for falls. Baseline: 2.08 ft/sec w/ RW; 2.43 ft/sec without AD (01/15/23) Goal status: IN PROGRESS  5.  Pt will ambulate >/=500' over level indoor surfaces at modI level to improve endurance and safety with household mobility. Baseline: 125' w/ RW SBA; 870' without AD and SBA/CGA Goal status: MET  ASSESSMENT:  CLINICAL IMPRESSION: Session limited by fatigue with session concluded early this visit.  She tolerates increased standing activity this session w/ focus on aerobic component to static balance tasks.  Her balance both statically and dynamically has drastically improved along with her progression to moderately complex tasks using multimodal cues.  She continues to be limited by exertional dyspnea and cardiovascular fatigue, but is motivated by family to remain  active at home.  Will continue to address deficits to progress towards LTGs.  OBJECTIVE IMPAIRMENTS: Abnormal gait, decreased activity tolerance, decreased balance, decreased strength, increased muscle spasms, improper body mechanics, and postural dysfunction.   ACTIVITY LIMITATIONS: carrying, lifting, bending, squatting, stairs, transfers, bed mobility, and locomotion level  PARTICIPATION LIMITATIONS: meal prep, cleaning, laundry, medication management, driving, shopping, and community activity  PERSONAL FACTORS: Age, Fitness, Past/current experiences, Transportation, and 1-2 comorbidities: recent left THA and HTN  are also affecting patient's functional outcome.   REHAB POTENTIAL: Good  CLINICAL DECISION MAKING: Stable/uncomplicated  EVALUATION COMPLEXITY: Low  PLAN:  PT FREQUENCY: 2x/week  PT DURATION: 8 weeks  PLANNED INTERVENTIONS: Therapeutic exercises, Therapeutic activity, Neuromuscular re-education, Balance training, Gait training, Patient/Family education, Self Care, Joint mobilization, Stair training, Vestibular training, DME instructions, Manual therapy, and Re-evaluation  PLAN FOR NEXT SESSION:  Right NMR, LLE strengthening, balance training-maybe manual dual task, compliant/unlevel surfaces, dynamic tasks, stair training, eyes closed, all endurance tasks and BREATHING, trial backwards tandem again, tilt board cone taps, SLS unsupported tasks, aerobic focused tasks   Bary Richard, PT, DPT 01/22/2023, 2:49 PM

## 2023-01-22 NOTE — Therapy (Signed)
OUTPATIENT SPEECH LANGUAGE PATHOLOGY TREATMENT   Patient Name: Kelsey Jacobs MRN: [id number] DOB:04-03-1946, 77 y.o., female 60 Date: 01/22/2023  PCP: Larene Beach MD REFERRING PROVIDER: Gwendolyn Lima, FNP  END OF SESSION:  End of Session - 01/22/23 1320     Visit Number 15    Number of Visits 25    Date for SLP Re-Evaluation 02/19/23    Authorization Type medicare    SLP Start Time 1315    SLP Stop Time  1400    SLP Time Calculation (min) 45 min    Activity Tolerance Patient tolerated treatment well             Past Medical History:  Diagnosis Date   Medical history non-contributory    Past Surgical History:  Procedure Laterality Date   BACK SURGERY     FOOT SURGERY     INCISION AND DRAINAGE ABSCESS Left 03/29/2021   Procedure: INCISION AND DRAINAGE AXILLARY ABSCESS;  Surgeon: Jovita Kussmaul, MD;  Location: [facility];  Service: General;  Laterality: Left;   Patient Active Problem List   Diagnosis Date Noted   Axillary abscess 03/30/2021    ONSET DATE: 11/06/22   REFERRING DIAG: [id number] (ICD-10-CM) - Arterial ischemic stroke, MCA (middle cerebral artery), left, acute ([place]) Z74.09 (ICD-10-CM) - Other reduced mobility Z78.9 (ICD-10-CM) - Other specified health status I69.320 (ICD-10-CM) - Aphasia following cerebral infarction  THERAPY DIAG:  Aphasia  Rationale for Evaluation and Treatment: Rehabilitation  SUBJECTIVE:   SUBJECTIVE STATEMENT: "We're doing well" PAIN:  Are you having pain? No  OBJECTIVE:   TODAY'S TREATMENT:   01-22-23: Targeting word finding, written expression and multimodal communication generating grocery/ingredient list for 2 dishes she made, chicken Piccata and meatballs - Krisna wrote 7 ingredients for piccata with usual min to mod fill in the blank and written cues. She Id'd errors with rare min A, consistent mod A to correct. She wrote 6 ingredients/grocery list for meatballs with usual mod A to correct errors. Sharilyn correctly  wrote 6/13 ingredients with no errors, others she approximated the word enough for me and spouse to comprehend. She used SGD to indicate that she wanted to talk about travel, locating travel folder. Instructed son (pt instructions) to customize travel folder on her home page for access to this relevant topic. Using SGD, written expression, SLP questioning and written cues, Shealeigh took 4 turns in conversation re: shopping and brands to ID and indicate clothing and handbag brand preferences.   01-20-23: Pt received her Touch Talk and brought it in today. She used SGD to tell me where she ate with her friend and we added another restaurant she frequents. Using SGD she Id'd 3 friends she has seen the past week.  Alannie used Teacher, English as a foreign language (writing, SGD and speech at word level) to ask about extending her visits with rare min A to use writing. Targeted word finding and verbal expression generating list of 12 items in personally relevant category (make up) with consistent gestures, written cues for auditory comprehension. She repeated each item with placement cues 3x, 9/12 words. Al reports successful use of SGD and written communication at home with family support to augment verbal expression. She wrote (approximated) 3 brands of personal care items and Al Id'd them quickly. Nikeya continues to benefit from encouragement to expand her communicative settings. Instructed [name] (see patient instructions) to practice with her doing internet searches to her favorite stores and texting.   01-17-23: Pt accompanied by spouse. SLP provided assist to  backup SGD. Spouse reported that the pt used to be an avid reader. SLP linked audio book website to [place] and demonstrated how to navigate the [employer] app to [name]. SLP discussed benefits of app because it allows for changing font size to support reading difficulty.  Pt reported that she has been feeling more anxious lately. She experienced overstimulation this session and  yesterday, per husband.  Provided external visual support (white board) and consistent visual cues and models, pt produced targeted one-, two-, and three- syllable combinations with ~35% accuracy. SLP provided model to demonstrate hand tapping out syllables when speaking. Utilized apraxia strategies to improve accurate productions (slow rate, divide into syllables, provide models and visual aids).  Pt upset during session d/t frustration with speech. She endorsed that her communication difficulty is "making me crazy". SLP supported pt with counseling and encouraged her to speak to her doctor about her concerns. Also discussed creating a journal for pt so that her family can help her track her progress/recovery.  01-15-23: SGD backed up per family request. They continue to report success using SGD for preferences and choices at home. Ongoing training in use of white board on device to augment communication. Rickesha wrote names of 5 basic objects to photo 4/5x, with written and verbal cues she corrected error 1/5. Progressed to using written expression at word level to write answers to simple personally relevant categories. She frequently required visual, auditory and slow rate cues to comprehend the questions. With extended time and usual min A, Pe[place]on wrote approximations or accurate word 10/12 trials. She consistently Id'd errors, however she required consistent written fill in the blank cues to self correct. Instructed spouse and son (see patient instructions) to cue Eliyah to use writing, accepting written approximations, to augment speech or device. Louie demonstrated accurate use of delete icon to clear the white board with occasional min A due to missing the icon and repeated tapping on the space above.    PATIENT EDUCATION: Education details: Sees today's treatment and patient instructions, aphasia ed Person educated: Patient, Spouse, and Child(ren) Education method: Explanation, Demonstration, Verbal cues, and  Handouts Education comprehension: verbalized understanding, verbal cues required, and needs further education   GOALS: Goals reviewed with patient? Yes  SHORT TERM GOALS: Target date: 12/25/22  Pt will name 5 family members with occasional mod A Baseline: Goal status: MET  2.  Pt will answer personally relevant yes/no questions accurately 85% with multimodal communication Baseline:  Goal status: MET  3.  Pt will ID 5 food and shopping preferences using multimodal communication with occasional min A Baseline:  Goal status: MET  4.  Pt will name 6 items in personally relevant categories using AAC/multimodal communication as needed with occasional mod A Baseline:  Goal status: NOT MET  5.  Family/caregivers will carryover 3 communication strategies to support pt's comprehension and expression Baseline:  Goal status: MET   LONG TERM GOALS: Target date: 02/18/22  Pt will initiate use mulitmodal communication to communicate wants/needs 3x over 1 week with usual min A from caregivers Baseline:  Goal status: IN PROGRESS  2.  Pt or caregivers will customize 4 items on AAC system with rare min A  Baseline:  Goal status: IN PROGRESS  3.  Pt will use multimodal communication to communicate 3 grocery items and 3 personal care items she needs with occasional min A from family Baseline:  Goal status: IN PROGRESS  4.  Pt will access online shopping site she uses and purchase a  needed item with occasional  min A from family Baseline:  Goal status: IN PROGRESS  5.  Using compensatory strategies and multimodal communication, pt will take 3turns in conversation with occasional min A Baseline:  Goal status: IN PROGRESS  ASSESSMENT:  CLINICAL IMPRESSION: Patient is a 77 y.o. female who was seen today for aphasia. 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 Pt  demonstrating improvement with spontaneous verbal productions. Responsive to verbal models. Imitation at word level, continues to improve. Continue to recommend utilization of SGD, as family reports success in communication with device. Improved written expression at the word level, with min to mod Genesis is writing to augment communication as well as use SGD Continue to recommend skilled ST services.   OBJECTIVE IMPAIRMENTS: include attention, memory, aphasia, and apraxia. These impairments are limiting patient from return to work, managing medications, managing appointments, managing finances, household responsibilities, ADLs/IADLs, and effectively communicating at home and in community. Factors affecting potential to achieve goals and functional outcome are severity of impairments. Patient will benefit from skilled SLP services to address above impairments and improve overall function.  REHAB POTENTIAL: Good  PLAN:  SLP FREQUENCY: 2x/week  SLP DURATION: 12 weeks  PLANNED INTERVENTIONS: Language facilitation, Environmental controls, Cueing hierachy, Cognitive reorganization, Internal/external aids, Functional tasks, Multimodal communication approach, SLP instruction and feedback, Compensatory strategies, and Patient/family education   Melyssa, [place], CCC-SLP 01/22/2023, 2:15 PM

## 2023-01-22 NOTE — Therapy (Signed)
OUTPATIENT OCCUPATIONAL THERAPY NEURO PROGRESS NOTE  Patient Name: Kelsey Jacobs MRN: [id number] DOB:1946/06/21, 77 y.o., female Today's Date: 01/22/2023  PCP: Dr. Vanessa Barbara  REFERRING PROVIDER: Levada Schilling, MD  END OF SESSION:  OT End of Session - 01/22/23 1404     Visit Number 10    Number of Visits 17    Date for OT Re-Evaluation 02/16/23    Authorization Type MCR, UHC    Progress Note Due on Visit 10    OT Start Time 1405    OT Stop Time 1443    OT Time Calculation (min) 38 min    Activity Tolerance Patient tolerated treatment well    Behavior During Therapy WFL for tasks assessed/performed               Past Medical History:  Diagnosis Date   Medical history non-contributory    Past Surgical History:  Procedure Laterality Date   BACK SURGERY     FOOT SURGERY     INCISION AND DRAINAGE ABSCESS Left 03/29/2021   Procedure: INCISION AND DRAINAGE AXILLARY ABSCESS;  Surgeon: Jovita Kussmaul, MD;  Location: [facility];  Service: General;  Laterality: Left;   Patient Active Problem List   Diagnosis Date Noted   Axillary abscess 03/30/2021    ONSET DATE: 12/04/2022 referral date  REFERRING DIAG:  Diagnosis  I63.512 (ICD-10-CM) - Cerebral infarction due to unspecified occlusion or stenosis of left middle cerebral artery  Z74.09 (ICD-10-CM) - Other reduced mobility  Z78.9 (ICD-10-CM) - Other specified health status  I69.320 (ICD-10-CM) - Aphasia following cerebral infarction    THERAPY DIAG:  Hemiplegia and hemiparesis following cerebral infarction affecting right dominant side (HCC)  Visuospatial deficit  Attention and concentration deficit  Muscle weakness (generalized)  Rationale for Evaluation and Treatment: Rehabilitation  SUBJECTIVE:   SUBJECTIVE STATEMENT: She has been helping in the kitchen but has not made a stovetop meal by herself yet.   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  8/10 shoulder(s), unable to identify which vs both shoulders with exercises  FALLS: Has patient fallen in last 6 months? No  LIVING ENVIRONMENT: Lives with: lives with their spouse and lives with their son-son will be leaving eventually, but is there indefinitely  Lives in: House/apartment (pt lives on first floor)  Stairs: Yes: Internal: 15 steps; on left going up and External: 2 steps; uses handheld assist and door frame Has following equipment at home: Quad cane small base, Walker - 2 wheeled, bed side commode, Grab bars, and transport chair, chair alarm, bed alarm, call button for son, insertable bed rails   PLOF: Independent with transfers, Needs assistance with ADLs, Needs assistance with homemaking, Needs assistance with gait, and typically has supervision with transfers   PATIENT GOALS: PT asks pt if she would like to get stronger to which she nods.  Son would like for mom to get back to her normal routine safely.  And to be able to leave her alone and know she is safe and for her to transition to the cane just to be a bit more independent.    OBJECTIVE: (from evaluation unless otherwise noted)  HAND DOMINANCE: Right  ADLs: Overall ADLs: mostly supervision/cueing, min assist at times Transfers/ambulation related to ADLs: Eating: mod I  Grooming: mod I  UB Dressing: mod I  LB Dressing: min assist to don Lt foot in pants and occasionally Lt sock, slip on shoes Toileting: mod I (occasional assist for perineal care w/ BM)  Bathing: set up, cues for thoroughness Tub Shower transfers: supervision/cues Equipment:  BSC for shower seat, grab bars  IADLs: Shopping: husband has done for several years Light housekeeping: dependent, has cleaning lady for heavier cleaning 1x/month Meal Prep: dependent at this  time Community mobility: not currently driving - relying on family Medication management: husband administering Financial management: son has been doing Handwriting: 100% legible and print, name only  MOBILITY STATUS:  uses walker   UPPER EXTREMITY ROM:  BUE AROM WNLs   UPPER EXTREMITY MMT:   RUE grossly 4/5, LUE MMT grossly 5/5   HAND FUNCTION: Grip strength: Right: 38.1 lbs; Left: 33.0 lbs  COORDINATION: 9 Hole Peg test: Right: 28 sec; Left: 26 sec  SENSATION: Slightly dulled RUE, pt appears to detect stimuli and localization  EDEMA: none   COGNITION: Overall cognitive status: Difficulty to assess due to: Communication impairment (aphasia). Pt can follow 1 step visual cues/demo  VISION: Subjective report: bluriness since stroke Baseline vision: Wears glasses all the time Visual history: corrective eye surgery  VISION ASSESSMENT: Not tested D/t aphasia   PERCEPTION: Not tested  PRAXIS: Not tested  OBSERVATIONS: Pt mostly limited by Mervin Hack, overall needs sup/min assist for BADLS  TODAY'S TREATMENT:                                                                                                                              - Neuro re-education completed for duration as noted below including: R shoulder abduction 4/5 with MMT all other planes WNL  3 lb wrist weight shoulder flexion, abduction, horizontal abduction, elbow flexion, elbow extension x 10 each for strengthening.   Wrist flex and ext with yellow flex bar x  30 seconds for strength and endurance of affected extremity  Supination with yellow flex bar x 2 min for strength and endurance of affected extremity  Pronation with tan flex bar x 1 min for strength and endurance of affected extremity  Functional lifting and placing of weighted bowls, pans, and pots side to side with cues to avoid R shoulder hike for strengthening.  PATIENT EDUCATION: Education details:ADL completion Person educated: Patient  and Spouse Education method: Explanation, Demonstration, Tactile cues, Verbal cues, and Handouts Education comprehension: verbalized understanding, returned demonstration, verbal cues required, tactile cues required, and needs further education  HOME EXERCISE PROGRAM: 1/12 - dowel HEP 1/31 - RUE yellow theraband   GOALS: Goals reviewed with patient? Yes  SHORT TERM GOALS: Target date: 01/18/23  Independent with HEP for RUE strengthening Baseline: Goal status: MET  2.  Pt to perform LE dressing (donning pants and Lt sock) w/ AE and no more than min cues Baseline:  Goal status: MET  3.  Pt to prepare simple snack, sandwich, or microwaveable items with min  cues and direct sup Baseline:  Goal status: IN PROGRESS  4.  Pt to perform bathing and perineal care with no more than min cues/sup Baseline:  Goal status: MET   LONG TERM GOALS: Target date: 02/16/23  Independent with all BADLS Baseline:  [id number]: Supervision to intermittent min A Goal status: IN PROGRESS  2.  Pt to perform familiar stovetop meal with direct supervision and min cues prn Baseline:  2/14: planning to cook stovetop pasta meal this weekend Goal status: IN PROGRESS  3.  Pt to consistently perform light IADLS including washing dishes and laundry Baseline:  01/20/2023 - has been doing laundry and putting away dishes (uses dishwasher) Goal status: IN PROGRESS  ASSESSMENT:  CLINICAL IMPRESSION: Pt is continuing to make physical gains with improve balance and participation in ADLS. Pt limited by aphasia  PERFORMANCE DEFICITS: in functional skills including ADLs, IADLs, coordination, dexterity, sensation, strength, mobility, body mechanics, decreased knowledge of use of DME, and UE functional use, cognitive skills including attention and safety awareness, and psychosocial skills including coping strategies.   IMPAIRMENTS: are limiting patient from ADLs, IADLs, leisure, and social participation.    CO-MORBIDITIES: may have co-morbidities  that affects occupational performance. Patient will benefit from skilled OT to address above impairments and improve overall function.  REHAB POTENTIAL: Good  PLAN:  OT FREQUENCY: 2x/week  OT DURATION: 8 weeks, plus evaluation (may only need 6 weeks)   PLANNED INTERVENTIONS: self care/ADL training, therapeutic exercise, therapeutic activity, neuromuscular re-education, balance training, functional mobility training, moist heat, patient/family education, cognitive remediation/compensation, visual/perceptual remediation/compensation, coping strategies training, and DME and/or AE instructions  RECOMMENDED OTHER SERVICES: none at this time  CONSULTED AND AGREED WITH PLAN OF CARE: Patient and family member/caregiver  PLAN FOR NEXT SESSION:  env scanning review; higher level cognition, progress towards remaining goals; review ADLs   Dennis Bast, OT 01/22/2023, 5:49 PM

## 2023-01-27 ENCOUNTER — Ambulatory Visit: Payer: Medicare Other | Admitting: Occupational Therapy

## 2023-01-27 ENCOUNTER — Ambulatory Visit: Payer: Medicare Other | Admitting: Speech Pathology

## 2023-01-27 ENCOUNTER — Encounter: Payer: Self-pay | Admitting: Occupational Therapy

## 2023-01-27 ENCOUNTER — Encounter: Payer: Self-pay | Admitting: Physical Therapy

## 2023-01-27 ENCOUNTER — Ambulatory Visit: Payer: Medicare Other | Admitting: Physical Therapy

## 2023-01-27 DIAGNOSIS — R4701 Aphasia: Secondary | ICD-10-CM

## 2023-01-27 DIAGNOSIS — R41842 Visuospatial deficit: Secondary | ICD-10-CM

## 2023-01-27 DIAGNOSIS — R2681 Unsteadiness on feet: Secondary | ICD-10-CM

## 2023-01-27 DIAGNOSIS — R41840 Attention and concentration deficit: Secondary | ICD-10-CM

## 2023-01-27 DIAGNOSIS — I69351 Hemiplegia and hemiparesis following cerebral infarction affecting right dominant side: Secondary | ICD-10-CM

## 2023-01-27 DIAGNOSIS — R2689 Other abnormalities of gait and mobility: Secondary | ICD-10-CM

## 2023-01-27 DIAGNOSIS — M6281 Muscle weakness (generalized): Secondary | ICD-10-CM

## 2023-01-27 NOTE — Therapy (Unsigned)
OUTPATIENT PHYSICAL THERAPY NEURO TREATMENT NOTE   Patient Name: Kelsey Jacobs MRN: [id number] DOB:May 13, 1946, 77 y.o., female Today's Date: 01/27/2023  PCP: Dr. Michell Heinrich [facility] at [facility]) [place]: Levada Schilling, MD  END OF SESSION:   PT End of Session - 01/27/23 1454     Visit Number 15    Number of Visits 17   16+eval   Date for PT Re-Evaluation 02/07/23   pushed out due to potential scheduling delay   Authorization Type MEDICARE PART A AND B    Progress Note Due on Visit 20    PT Start Time 1450    PT Stop Time 1530    PT Time Calculation (min) 40 min    Equipment Utilized During Treatment Gait belt    Activity Tolerance Patient tolerated treatment well    Behavior During Therapy WFL for tasks assessed/performed            Past Medical History:  Diagnosis Date   Medical history non-contributory    Past Surgical History:  Procedure Laterality Date   BACK SURGERY     FOOT SURGERY     INCISION AND DRAINAGE ABSCESS Left 03/29/2021   Procedure: INCISION AND DRAINAGE AXILLARY ABSCESS;  Surgeon: Jovita Kussmaul, MD;  Location: [facility];  Service: General;  Laterality: Left;   Patient Active Problem List   Diagnosis Date Noted   Axillary abscess 03/30/2021    ONSET DATE: 11/28/2022  REFERRING DIAG: R26.9 (ICD-10-CM) - Gait abnormality  THERAPY DIAG:  Muscle weakness (generalized)  Hemiplegia and hemiparesis following cerebral infarction affecting right dominant side ([place])  Other abnormalities of gait and mobility  Unsteadiness on feet  Rationale for Evaluation and Treatment: Rehabilitation  SUBJECTIVE:                                                                                                                                                                                             SUBJECTIVE STATEMENT:   She primarily ambulates with the cane.  He RLE lower portion continues to bother her and she is wearing the lidocaine  patch.  She is unsure it is helping.  She denies falls and husband denies near falls.  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 YES 4/10 Right lower leg Tingly Intermittent Unsure what aggravates or improves.  PRECAUTIONS: Fall-anterior hip precautions have been lifted  WEIGHT BEARING RESTRICTIONS: No  FALLS: Has patient fallen in last 6 months? No  LIVING ENVIRONMENT: Lives with: lives  with their spouse and lives with their son-son will be leaving eventually, but is there indefinitely  Lives in: House/apartment Stairs: Yes: Internal: 15 steps; on left going up and External: 2 steps; uses handheld assist and door frame Has following equipment at home: Quad cane small base, Walker - 2 wheeled, bed side commode, Grab bars, and transport chair, chair alarm, bed alarm, call button for son, insertable bed rails  PLOF: Independent with transfers, Needs assistance with ADLs, Needs assistance with homemaking, Needs assistance with gait, and typically has supervision with transfers  PATIENT GOALS: PT asks pt if she would like to get stronger to which she nods.  Son would like for mom to get back to her normal routine safely.  And to be able to leave her alone and know she is safe and for her to transition to the cane just to be a bit more independent.  OBJECTIVE:  -SciFit x6 minutes using BUE/BLE in hill mode on level 5.0 for dynamic cardiovascular warmup -Incline crunches x8 > yellow dowel (2lb) w/ 3lb wt x15 w/ inc demo and multimodal cuing for form -Discussed scheduling and plan for remaining 3 visits and plan for discharge at end of current cert. -STS w/ green theraband posterior resistance at waist x15, cued for eccentric control -Soft BOSU step ups w/ unilateral UE support on counter x10 each LE -Unilateral counter support forward and  backwards tandem (once backward, twice forward) x10'  PATIENT EDUCATION: Education details: Continue use of cane for safety.  Discussion of upcoming discharge plan due to good patient progress.  Person educated: Patient and Spouse Education method: Customer service manager Education comprehension: verbalized understanding, returned demonstration, and needs further education  HOME EXERCISE PROGRAM:  Access Code: [email or web address] URL: [email or web address] Date: 01/08/2023 Prepared by: Elease Etienne  Exercises - Seated Hamstring Stretch  - 1 x daily - 5 x weekly - 1 sets - 2-3 reps - 45 seconds hold - Seated Single Knee to Chest  - 1 x daily - 5 x weekly - 1 sets - 2-3 reps - 45 seconds hold - Standing Hip Flexor Stretch  - 1 x daily - 5 x weekly - 1 sets - 2-3 reps - 45 seconds hold - Standing Hip Extension with Leg Bent and Support  - 1 x daily - 5 x weekly - 1 sets - 10 reps - Forward Backward Monster Walk with Band at [employer] and Counter Support  - 1 x daily - 5 x weekly - 3 sets - 10 reps - Tandem Walking with Counter Support  - 1 x daily - 5 x weekly - 3 sets - 10 reps - Backward Walking with Counter Support  - 1 x daily - 5 x weekly - 3 sets - 10 reps  GOALS: Goals reviewed with patient? Yes  SHORT TERM GOALS: Target date: 01/03/2023  Pt will perform strength, stretching, and balance HEP with supervision from family to maintain progress. Baseline:  Revised based on current functional level 01/01/2023 Goal status: MET  2.  Increase Berg score to >/ 42/56 to demo improvement in balance. Baseline: 37/56 (1/2); 46/56 (1/24) Goal status: MET  3.  Pt will decrease 5xSTS to </=13 seconds w/ BUE support in order to demonstrate decreased risk for falls and improved functional bilateral LE strength and power. Baseline: 16.86 sec w/ BUE support; 13.47 sec w/ BUE support (01/01/2023) Goal status: IN PROGRESS  4.  Pt will demonstrate a gait speed of >/=2.5 feet/sec using  LRAD in order to  decrease risk for falls. Baseline: 2.08 ft/sec w/ RW; 2.57 ft/sec w/ RW (1/24) Goal status: MET  5.  Pt will negotiation 2 stairs w/o rail using LRAD at no more than supervision level in order to improve safe accessibility to home environment. Baseline: Pt needs minA at home to access home via garage or front door; son reports they built a rail and pt is safest with this option at this time (01/02/2023) Goal status: REVISED-D/C'd  LONG TERM GOALS: Target date: 01/31/2023  Pt will increase FOTO score to 69% in order to demonstrate subjective functional improvement. Baseline: 54% Goal status: INITIAL  2.  BERG to be assessed w/ STG/LTG set as appropriate.  Increase Berg score to >/= 47/56 for reduced fall risk.  Baseline: 37/56 on 12-10-22 Goal status: INITIAL  3.  Pt will decrease 5xSTS to </=13 seconds w/o BUE support in order to demonstrate decreased risk for falls and improved functional bilateral LE strength and power. Baseline: 15.45 sec without UE support (01/15/23) Goal status: IN PROGRESS  4.  Pt will demonstrate a gait speed of >/=2.98 feet/sec using LRAD in order to decrease risk for falls. Baseline: 2.08 ft/sec w/ RW; 2.43 ft/sec without AD (01/15/23) Goal status: IN PROGRESS  5.  Pt will ambulate >/=500' over level indoor surfaces at modI level to improve endurance and safety with household mobility. Baseline: 125' w/ RW SBA; 870' without AD and SBA/CGA Goal status: MET  ASSESSMENT:  CLINICAL IMPRESSION: Pt tolerates session better this visit with less fatigue impacting completion of session.  Continued work on dynamic balance and LE stability w/ pt verbalizing fear of falling during higher level SLS task.  Overall, her dynamic stability and performance of more complex tasks has drastically improved w/ pt able to tolerate increased aerobic demand with decreased UE reliance.  She continues to benefit from skilled PT to address ongoing LE discomfort and to promote  functional independence w/ cane and decrease fall risk as able.  OBJECTIVE IMPAIRMENTS: Abnormal gait, decreased activity tolerance, decreased balance, decreased strength, increased muscle spasms, improper body mechanics, and postural dysfunction.   ACTIVITY LIMITATIONS: carrying, lifting, bending, squatting, stairs, transfers, bed mobility, and locomotion level  PARTICIPATION LIMITATIONS: meal prep, cleaning, laundry, medication management, driving, shopping, and community activity  PERSONAL FACTORS: Age, Fitness, Past/current experiences, Transportation, and 1-2 comorbidities: recent left THA and HTN  are also affecting patient's functional outcome.   REHAB POTENTIAL: Good  CLINICAL DECISION MAKING: Stable/uncomplicated  EVALUATION COMPLEXITY: Low  PLAN:  PT FREQUENCY: 2x/week  PT DURATION: 8 weeks  PLANNED INTERVENTIONS: Therapeutic exercises, Therapeutic activity, Neuromuscular re-education, Balance training, Gait training, Patient/Family education, Self Care, Joint mobilization, Stair training, Vestibular training, DME instructions, Manual therapy, and Re-evaluation  PLAN FOR NEXT SESSION:  Right NMR, LLE strengthening, balance training-maybe manual dual task, compliant/unlevel surfaces, dynamic tasks, stair training, eyes closed, all endurance tasks and BREATHING, trial backwards tandem again, tilt board cone taps, SLS unsupported tasks, aerobic focused tasks, Begin assessing LTGs for remaining 3 visits.   Bary Richard, PT, DPT 01/27/2023, 3:31 PM

## 2023-01-27 NOTE — Therapy (Unsigned)
OUTPATIENT OCCUPATIONAL THERAPY NEURO PROGRESS NOTE  Patient Name: Kelsey Jacobs MRN: [id number] DOB:1946/01/14, 77 y.o., female Today's Date: 01/27/2023  PCP: Dr. Vanessa Barbara  REFERRING PROVIDER: Levada Schilling, MD  END OF SESSION:  OT End of Session - 01/27/23 1407     Visit Number 11    Number of Visits 17    Date for OT Re-Evaluation 02/16/23    Authorization Type MCR, UHC    Progress Note Due on Visit 10    OT Start Time 1405    OT Stop Time 1445    OT Time Calculation (min) 40 min    Activity Tolerance Patient tolerated treatment well    Behavior During Therapy WFL for tasks assessed/performed              Past Medical History:  Diagnosis Date   Medical history non-contributory    Past Surgical History:  Procedure Laterality Date   BACK SURGERY     FOOT SURGERY     INCISION AND DRAINAGE ABSCESS Left 03/29/2021   Procedure: INCISION AND DRAINAGE AXILLARY ABSCESS;  Surgeon: Jovita Kussmaul, MD;  Location: [facility];  Service: General;  Laterality: Left;   Patient Active Problem List   Diagnosis Date Noted   Axillary abscess 03/30/2021    ONSET DATE: 12/04/2022 referral date  REFERRING DIAG:  Diagnosis  I63.512 (ICD-10-CM) - Cerebral infarction due to unspecified occlusion or stenosis of left middle cerebral artery  Z74.09 (ICD-10-CM) - Other reduced mobility  Z78.9 (ICD-10-CM) - Other specified health status  I69.320 (ICD-10-CM) - Aphasia following cerebral infarction    THERAPY DIAG:  Muscle weakness (generalized)  Hemiplegia and hemiparesis following cerebral infarction affecting right dominant side ([facility])  Visuospatial deficit  Attention and concentration deficit  Aphasia  Rationale for Evaluation and Treatment: Rehabilitation  SUBJECTIVE:   SUBJECTIVE STATEMENT: She helped in the kitchen again but still did not make a stovetop meal by herself yet.   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  8/10 shoulder(s), unable to identify which vs both shoulders with exercises  FALLS: Has patient fallen in last 6 months? No  LIVING ENVIRONMENT: Lives with: lives with their spouse and lives with their son-son will be leaving eventually, but is there indefinitely  Lives in: House/apartment (pt lives on first floor)  Stairs: Yes: Internal: 15 steps; on left going up and External: 2 steps; uses handheld assist and door frame Has following equipment at home: Quad cane small base, Walker - 2 wheeled, bed side commode, Grab bars, and transport chair, chair alarm, bed alarm, call button for son, insertable bed rails   PLOF: Independent with transfers, Needs assistance with ADLs, Needs assistance with homemaking, Needs assistance with gait, and typically has supervision with transfers   PATIENT GOALS: PT asks pt if she would like to get stronger to which she nods.  Son would like for mom to get back to her normal routine safely.  And to be able to leave her alone and know she is safe and for her to transition to the cane just to be a bit more independent.    OBJECTIVE: (from evaluation unless otherwise noted)  HAND DOMINANCE: Right  ADLs: Overall ADLs: mostly supervision/cueing, min assist at times Transfers/ambulation related to ADLs: Eating: mod I  Grooming: mod I  UB Dressing: mod I  LB Dressing: min assist to don Lt foot in pants and occasionally Lt sock, slip on shoes Toileting: mod I (occasional assist for perineal care w/ BM)  Bathing: set up, cues for thoroughness Tub Shower transfers: supervision/cues Equipment:  BSC for shower seat, grab bars  IADLs: Shopping: husband has done for several years Light housekeeping: dependent, has cleaning lady for heavier cleaning 1x/month Meal Prep: dependent at this  time Community mobility: not currently driving - relying on family Medication management: husband administering Financial management: son has been doing Handwriting: 100% legible and print, name only  MOBILITY STATUS:  uses walker   UPPER EXTREMITY ROM:  BUE AROM WNLs   UPPER EXTREMITY MMT:   RUE grossly 4/5, LUE MMT grossly 5/5   HAND FUNCTION: Grip strength: Right: 38.1 lbs; Left: 33.0 lbs  COORDINATION: 9 Hole Peg test: Right: 28 sec; Left: 26 sec  SENSATION: Slightly dulled RUE, pt appears to detect stimuli and localization  EDEMA: none   COGNITION: Overall cognitive status: Difficulty to assess due to: Communication impairment (aphasia). Pt can follow 1 step visual cues/demo  VISION: Subjective report: bluriness since stroke Baseline vision: Wears glasses all the time Visual history: corrective eye surgery  VISION ASSESSMENT: Not tested D/t aphasia   PERCEPTION: Not tested  PRAXIS: Not tested  OBSERVATIONS: Pt mostly limited by Mervin Hack, overall needs sup/min assist for BADLS  TODAY'S TREATMENT:                                                                                                                              - Neuro re-education completed for duration as noted below including: Word search x 2 for visual scanning and processing requiring encouragement, increased time, and cues for completion.   Pt participated in Memory game (20 pieces) x 2 requiring R coordination, scanning, ROM, and verbalization of animal after finding a match. Increased time and cueing required for completion.   PATIENT EDUCATION: Education details:ADL completion Person educated: Patient and Spouse Education method: Explanation, Demonstration, Tactile cues, Verbal cues, and Handouts Education comprehension: verbalized understanding, returned demonstration, verbal cues required, tactile cues required, and needs further education  HOME EXERCISE PROGRAM: 1/12 - dowel HEP 1/31 -  RUE yellow theraband   GOALS: Goals reviewed with patient? Yes  SHORT TERM GOALS: Target date: 01/18/23  Independent with HEP for RUE strengthening Baseline: Goal status: MET  2.  Pt to perform LE dressing (donning pants and Lt sock) w/ AE and no more than min cues Baseline:  Goal status: MET  3.  Pt to prepare simple snack, sandwich, or microwaveable items with min cues and direct sup Baseline:  Goal status: IN PROGRESS  4.  Pt to perform bathing and perineal care with no more than min cues/sup Baseline:  Goal status: MET   LONG TERM GOALS: Target date: 02/16/23  Independent with all BADLS Baseline:  [id number]: Supervision to intermittent min A Goal status: IN PROGRESS  2.  Pt to perform familiar stovetop meal with direct supervision and min cues prn Baseline:  2/14: planning to cook stovetop pasta meal this weekend Goal status: IN PROGRESS  3.  Pt to consistently perform light IADLS including washing dishes and laundry Baseline:  01/20/2023 - has been doing laundry and putting away dishes (uses dishwasher) Goal status: IN PROGRESS  ASSESSMENT:  CLINICAL IMPRESSION: Pt remains good candidate for skilled OT services to maximize independence and safety with ADLs and IADLs secondary to aphasia and cognitive impairments.   PERFORMANCE DEFICITS: in functional skills including ADLs, IADLs, coordination, dexterity, sensation, strength, mobility, body mechanics, decreased knowledge of use of DME, and UE functional use, cognitive skills including attention and safety awareness, and psychosocial skills including coping strategies.   IMPAIRMENTS: are limiting patient from ADLs, IADLs, leisure, and social participation.   CO-MORBIDITIES: may have co-morbidities  that affects occupational performance. Patient will benefit from skilled OT to address above impairments and improve overall function.  REHAB POTENTIAL: Good  PLAN:  OT FREQUENCY: 2x/week  OT DURATION: 8 weeks, plus  evaluation (may only need 6 weeks)   PLANNED INTERVENTIONS: self care/ADL training, therapeutic exercise, therapeutic activity, neuromuscular re-education, balance training, functional mobility training, moist heat, patient/family education, cognitive remediation/compensation, visual/perceptual remediation/compensation, coping strategies training, and DME and/or AE instructions  RECOMMENDED OTHER SERVICES: none at this time  CONSULTED AND AGREED WITH PLAN OF CARE: Patient and family member/caregiver  PLAN FOR NEXT SESSION:  env scanning review; higher level cognition, progress towards remaining goals; review ADLs   Dennis Bast, OT 01/27/2023, 2:14 PM

## 2023-01-27 NOTE — Therapy (Signed)
OUTPATIENT SPEECH LANGUAGE PATHOLOGY TREATMENT   Patient Name: Kelsey Jacobs MRN: [id number] DOB:11-May-1946, 77 y.o., female 68 Date: 01/27/2023  PCP: Larene Beach MD REFERRING PROVIDER: Gwendolyn Lima, FNP  END OF SESSION:  End of Session - 01/27/23 1317     Visit Number 16    Number of Visits 25    Date for SLP Re-Evaluation 02/19/23    Authorization Type medicare    SLP Start Time 1318    SLP Stop Time  1400    SLP Time Calculation (min) 42 min    Activity Tolerance Patient tolerated treatment well             Past Medical History:  Diagnosis Date   Medical history non-contributory    Past Surgical History:  Procedure Laterality Date   BACK SURGERY     FOOT SURGERY     INCISION AND DRAINAGE ABSCESS Left 03/29/2021   Procedure: INCISION AND DRAINAGE AXILLARY ABSCESS;  Surgeon: Jovita Kussmaul, MD;  Location: [facility];  Service: General;  Laterality: Left;   Patient Active Problem List   Diagnosis Date Noted   Axillary abscess 03/30/2021    ONSET DATE: 11/06/22   REFERRING DIAG: [id number] (ICD-10-CM) - Arterial ischemic stroke, MCA (middle cerebral artery), left, acute ([place]) Z74.09 (ICD-10-CM) - Other reduced mobility Z78.9 (ICD-10-CM) - Other specified health status I69.320 (ICD-10-CM) - Aphasia following cerebral infarction  THERAPY DIAG:  Aphasia  Rationale for Evaluation and Treatment: Rehabilitation  SUBJECTIVE:   SUBJECTIVE STATEMENT: "We're good, but yesterday was hard because she got five hours of sleep" PAIN:  Are you having pain? No  OBJECTIVE:   TODAY'S TREATMENT:   01-27-23: Pt accompanied by spouse and son. Son reported that pt has been occasionally writing on SGD if she is struggling a lot to communicate with spoken word. SLP encouraged pt to use writing more frequently to supplement communication. Re-educated pt and family on purpose of SGD -- not only to practice speech, but also to support communication. SLP facilitated  conversation on topic of pt's past travel experiences. Pt demonstrated ability to navigate device to locate appropriate icons and utilize whiteboard app for written communication. Pt id'd writing errors with rare min A and self corrected to closer target approximation with usual mod A. Despite spelling errors, clinician and fmaily able to understand pt's written communicative attempts with approximately 85% accuracy. Of note: pt was writing names of locations, which included difficulty-to-spell words.  HEP: Add more travel terminology to SGD.   01-22-23: Targeting word finding, written expression and multimodal communication generating grocery/ingredient list for 2 dishes she made, chicken Piccata and meatballs - Kelsey Jacobs wrote 7 ingredients for piccata with usual min to mod fill in the blank and written cues. She Id'd errors with rare min A, consistent mod A to correct. She wrote 6 ingredients/grocery list for meatballs with usual mod A to correct errors. Kelsey Jacobs correctly wrote 6/13 ingredients with no errors, others she approximated the word enough for me and spouse to comprehend. She used SGD to indicate that she wanted to talk about travel, locating travel folder. Instructed son (pt instructions) to customize travel folder on her home page for access to this relevant topic. Using SGD, written expression, SLP questioning and written cues, Kelsey Jacobs took 4 turns in conversation re: shopping and brands to ID and indicate clothing and handbag brand preferences.   01-20-23: Pt received her Touch Talk and brought it in today. She used SGD to tell me where she ate with  her friend and we added another restaurant she frequents. Using SGD she Id'd 3 friends she has seen the past week.  [name] used Teacher, English as a foreign language (writing, SGD and speech at word level) to ask about extending her visits with rare min A to use writing. Targeted word finding and verbal expression generating list of 12 items in personally relevant category (make  up) with consistent gestures, written cues for auditory comprehension. She repeated each item with placement cues 3x, 9/12 words. [name] reports successful use of SGD and written communication at home with family support to augment verbal expression. She wrote (approximated) 3 brands of personal care items and [name] Id'd them quickly. Kelsey Jacobs to benefit from encouragement to expand her communicative settings. Instructed [name] (see patient instructions) to practice with her doing internet searches to her favorite stores and texting.   01-17-23: Pt accompanied by spouse. SLP provided assist to backup SGD. Spouse reported that the pt used to be an avid reader. SLP linked audio book website to [name] and demonstrated how to navigate the [place] app to [name]. SLP discussed benefits of app because it allows for changing font size to support reading difficulty.  Pt reported that she has been feeling more anxious lately. She experienced overstimulation this session and yesterday, per husband.  Provided external visual support (white board) and consistent visual cues and models, pt produced targeted one-, two-, and three- syllable combinations with ~35% accuracy. SLP provided model to demonstrate hand tapping out syllables when speaking. Utilized apraxia strategies to improve accurate productions (slow rate, divide into syllables, provide models and visual aids).  Pt upset during session d/t frustration with speech. She endorsed that her communication difficulty is "making me crazy". SLP supported pt with counseling and encouraged her to speak to her doctor about her concerns. Also discussed creating a journal for pt so that her family can help her track her progress/recovery.  01-15-23: SGD backed up per family request. They continue to report success using SGD for preferences and choices at home. Ongoing training in use of white board on device to augment communication. [name] wrote names of 5 basic objects to photo  4/5x, with written and verbal cues she corrected error 1/5. Progressed to using written expression at word level to write answers to simple personally relevant categories. She frequently required visual, auditory and slow rate cues to comprehend the questions. With extended time and usual min A, Yarelie wrote approximations or accurate word 10/12 trials. She consistently Id'd errors, however she required consistent written fill in the blank cues to self correct. Instructed spouse and son (see patient instructions) to cue Kalana to use writing, accepting written approximations, to augment speech or device. Nadine demonstrated accurate use of delete icon to clear the white board with occasional min A due to missing the icon and repeated tapping on the space above.    PATIENT EDUCATION: Education details: Sees today's treatment and patient instructions, aphasia ed Person educated: Patient, Spouse, and Child(ren) Education method: Explanation, Demonstration, Verbal cues, and Handouts Education comprehension: verbalized understanding, verbal cues required, and needs further education   GOALS: Goals reviewed with patient? Yes  SHORT TERM GOALS: Target date: 12/25/22  Pt will name 5 family members with occasional mod A Baseline: Goal status: MET  2.  Pt will answer personally relevant yes/no questions accurately 85% with multimodal communication Baseline:  Goal status: MET  3.  Pt will ID 5 food and shopping preferences using multimodal communication with occasional min A Baseline:  Goal  status: MET  4.  Pt will name 6 items in personally relevant categories using AAC/multimodal communication as needed with occasional mod A Baseline:  Goal status: NOT MET  5.  Family/caregivers will carryover 3 communication strategies to support pt's comprehension and expression Baseline:  Goal status: MET   LONG TERM GOALS: Target date: 02/18/22  Pt will initiate use mulitmodal communication to communicate  wants/needs 3x over 1 week with usual min A from caregivers Baseline:  Goal status: IN PROGRESS  2.  Pt or caregivers will customize 4 items on AAC system with rare min A  Baseline:  Goal status: MET  3.  Pt will use multimodal communication to communicate 3 grocery items and 3 personal care items she needs with occasional min A from family Baseline:  Goal status: IN PROGRESS  4.  Pt will access online shopping site she uses and purchase a needed item with occasional  min A from family Baseline:  Goal status: IN PROGRESS  5.  Using compensatory strategies and multimodal communication, pt will take 3turns in conversation with occasional min A Baseline:  Goal status: IN PROGRESS  ASSESSMENT:  CLINICAL IMPRESSION: Patient is a 77 y.o. female who was seen today for aphasia. 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 Pt demonstrating improvement with spontaneous verbal productions. Responsive to verbal models. Imitation at word level, Jacobs to improve. Continue to recommend utilization of SGD, as family reports success in communication with device. Improved written expression at the word level, with min to mod Doralee is writing to augment communication as well as use SGD Continue to recommend skilled ST services.   OBJECTIVE IMPAIRMENTS: include attention, memory, aphasia, and apraxia. These impairments are limiting patient from return to work, managing medications, managing appointments, managing finances, household responsibilities, ADLs/IADLs, and effectively communicating at home and in community. Factors affecting potential to achieve goals and functional outcome are severity of impairments. Patient will benefit from skilled SLP services to address above impairments and improve overall function.  REHAB POTENTIAL: Good  PLAN:  SLP FREQUENCY: 2x/week  SLP  DURATION: 12 weeks  PLANNED INTERVENTIONS: Language facilitation, Environmental controls, Cueing hierachy, Cognitive reorganization, Internal/external aids, Functional tasks, Multimodal communication approach, SLP instruction and feedback, Compensatory strategies, and Patient/family education   Leroy Libman, Student-SLP 01/27/2023, 2:46 PM

## 2023-01-29 ENCOUNTER — Ambulatory Visit: Payer: Medicare Other | Admitting: Speech Pathology

## 2023-01-29 ENCOUNTER — Encounter: Payer: Self-pay | Admitting: Speech Pathology

## 2023-01-29 ENCOUNTER — Encounter: Payer: Self-pay | Admitting: Physical Therapy

## 2023-01-29 ENCOUNTER — Ambulatory Visit: Payer: Medicare Other | Admitting: Occupational Therapy

## 2023-01-29 ENCOUNTER — Ambulatory Visit: Payer: Medicare Other | Admitting: Physical Therapy

## 2023-01-29 ENCOUNTER — Encounter: Payer: Self-pay | Admitting: Occupational Therapy

## 2023-01-29 DIAGNOSIS — R2681 Unsteadiness on feet: Secondary | ICD-10-CM

## 2023-01-29 DIAGNOSIS — R4701 Aphasia: Secondary | ICD-10-CM

## 2023-01-29 DIAGNOSIS — I69351 Hemiplegia and hemiparesis following cerebral infarction affecting right dominant side: Secondary | ICD-10-CM

## 2023-01-29 DIAGNOSIS — M6281 Muscle weakness (generalized): Secondary | ICD-10-CM

## 2023-01-29 DIAGNOSIS — R2689 Other abnormalities of gait and mobility: Secondary | ICD-10-CM

## 2023-01-29 DIAGNOSIS — R41840 Attention and concentration deficit: Secondary | ICD-10-CM

## 2023-01-29 DIAGNOSIS — R41842 Visuospatial deficit: Secondary | ICD-10-CM

## 2023-01-29 NOTE — Therapy (Signed)
OUTPATIENT PHYSICAL THERAPY NEURO TREATMENT NOTE   Patient Name: Kelsey Jacobs MRN: [id number] DOB:Feb 05, 1946, 77 y.o., female Today's Date: 01/29/2023  PCP: Dr. Michell Heinrich [facility] [place]: Levada Schilling, MD  END OF SESSION:   PT End of Session - 01/29/23 1458     Visit Number 16    Number of Visits 17   16+eval   Date for PT Re-Evaluation 02/07/23   pushed out due to potential scheduling delay   Authorization Type MEDICARE PART A AND B    Progress Note Due on Visit 20    PT Start Time 1447    PT Stop Time 1529    PT Time Calculation (min) 42 min    Equipment Utilized During Treatment Gait belt    Activity Tolerance Patient tolerated treatment well    Behavior During Therapy WFL for tasks assessed/performed            Past Medical History:  Diagnosis Date   Medical history non-contributory    Past Surgical History:  Procedure Laterality Date   BACK SURGERY     FOOT SURGERY     INCISION AND DRAINAGE ABSCESS Left 03/29/2021   Procedure: INCISION AND DRAINAGE AXILLARY ABSCESS;  Surgeon: Jovita Kussmaul, MD;  Location: [facility];  Service: General;  Laterality: Left;   Patient Active Problem List   Diagnosis Date Noted   Axillary abscess 03/30/2021    ONSET DATE: 11/28/2022  REFERRING DIAG: R26.9 (ICD-10-CM) - Gait abnormality  THERAPY DIAG:  Muscle weakness (generalized)  Hemiplegia and hemiparesis following cerebral infarction affecting right dominant side ([place])  Other abnormalities of gait and mobility  Unsteadiness on feet  Rationale for Evaluation and Treatment: Rehabilitation  SUBJECTIVE:                                                                                                                                                                                             SUBJECTIVE STATEMENT:   She primarily ambulates with the cane.  He RLE lower portion continues to bother her and she is wearing the lidocaine  patch over anterior tibial aspect of shin.  She denies falls or other acute changes.  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 YES 4/10 Right lower leg Tingly Intermittent Unsure what aggravates or improves.  PRECAUTIONS: Fall-anterior hip precautions have been lifted  WEIGHT BEARING RESTRICTIONS: No  FALLS: Has patient fallen in last 6 months? No  LIVING ENVIRONMENT: Lives with: lives with their  spouse and lives with their son-son will be leaving eventually, but is there indefinitely  Lives in: House/apartment Stairs: Yes: Internal: 15 steps; on left going up and External: 2 steps; uses handheld assist and door frame Has following equipment at home: Quad cane small base, Walker - 2 wheeled, bed side commode, Grab bars, and transport chair, chair alarm, bed alarm, call button for son, insertable bed rails  PLOF: Independent with transfers, Needs assistance with ADLs, Needs assistance with homemaking, Needs assistance with gait, and typically has supervision with transfers  PATIENT GOALS: PT asks pt if she would like to get stronger to which she nods.  Son would like for mom to get back to her normal routine safely.  And to be able to leave her alone and know she is safe and for her to transition to the cane just to be a bit more independent.  OBJECTIVE:  -Discussion of neuropathic pain vs musculoskeletal pain, provided option of trialing over-the-counter biofreeze or comparative topical (using small test area on lower leg before full application) to see if she finds any relief as patch is no longer helping.  Encouraged her to speak w/ neurologist about ongoing pain and use of topicals, patch, and stretching w/ limited relief to see if options outside PT scope are more viable for her. -SciFit x6 minutes using BUE/BLE in hill mode on level  5.0 for HIIT style dynamic cardiovascular warmup -5xSTS w/o UE support 16.29 seconds -Forward tandem on foam beam 3x10' tapping cones alternating laterality, backwards tandem 3x10'; progressing from BUE support to RUE support only -Standing on airex progressing to intermittent palm touch for balance alternating LE touch to midline cone > 2 anterolaterally oriented cones continuous tap before returning to midline standing, pt had difficulty maintaining sequenced w/o cuing -Step downs w/ retro step up using BUE support x10 w/ RLE, x8 w/ LLE prior to fatigue  STAIRS:  Level of Assistance: Modified independence  Stair Negotiation Technique: Step to Pattern Alternating Pattern  Forwards With use of AD: 6pt rubber tip cane  with Single Rail on Left  Number of Stairs: 4x4   Height of Stairs: 6"  Comments: First 2 trials completed as step-to on ascent and descent w/ cane on right.  Last 2 trials using reciprocal ascending, pt attempts descent w/ reciprocal x1 w/ slower movement and notable hesitancy.  Pt demos safer descent w/ step-to.  She is able to safely ascend stairs w/ reciprocal pattern, edu to husband that she can practice this w/ supervision at home for functional strengthening.  PATIENT EDUCATION: Education details: Continue use of cane for safety.  Discussion of upcoming discharge plan due to good patient progress.  Person educated: Patient and Spouse Education method: Customer service manager Education comprehension: verbalized understanding, returned demonstration, and needs further education  HOME EXERCISE PROGRAM:  Access Code: [email or web address] URL: [email or web address] Date: 01/08/2023 Prepared by: Elease Etienne  Exercises - Seated Hamstring Stretch  - 1 x daily - 5 x weekly - 1 sets - 2-3 reps - 45 seconds hold - Seated Single Knee to Chest  - 1 x daily - 5 x weekly - 1 sets - 2-3 reps - 45 seconds hold - Standing Hip Flexor Stretch  - 1 x daily - 5 x weekly -  1 sets - 2-3 reps - 45 seconds hold - Standing Hip Extension with Leg Bent and Support  - 1 x daily - 5 x weekly - 1 sets - 10 reps - Forward Backward Monster Walk with  Band at Thighs and Counter Support  - 1 x daily - 5 x weekly - 3 sets - 10 reps - Tandem Walking with Counter Support  - 1 x daily - 5 x weekly - 3 sets - 10 reps - Backward Walking with Counter Support  - 1 x daily - 5 x weekly - 3 sets - 10 reps  GOALS: Goals reviewed with patient? Yes  SHORT TERM GOALS: Target date: 01/03/2023  Pt will perform strength, stretching, and balance HEP with supervision from family to maintain progress. Baseline:  Revised based on current functional level 01/01/2023 Goal status: MET  2.  Increase Berg score to >/ 42/56 to demo improvement in balance. Baseline: 37/56 (1/2); 46/56 (1/24) Goal status: MET  3.  Pt will decrease 5xSTS to </=13 seconds w/ BUE support in order to demonstrate decreased risk for falls and improved functional bilateral LE strength and power. Baseline: 16.86 sec w/ BUE support; 13.47 sec w/ BUE support (01/01/2023) Goal status: IN PROGRESS  4.  Pt will demonstrate a gait speed of >/=2.5 feet/sec using LRAD in order to decrease risk for falls. Baseline: 2.08 ft/sec w/ RW; 2.57 ft/sec w/ RW (1/24) Goal status: MET  5.  Pt will negotiation 2 stairs w/o rail using LRAD at no more than supervision level in order to improve safe accessibility to home environment. Baseline: Pt needs minA at home to access home via garage or front door; son reports they built a rail and pt is safest with this option at this time (01/02/2023) Goal status: REVISED-D/C'd  LONG TERM GOALS: Target date: 01/31/2023  Pt will increase FOTO score to 69% in order to demonstrate subjective functional improvement. Baseline: 54% Goal status: INITIAL  2.  BERG to be assessed w/ STG/LTG set as appropriate.  Increase Berg score to >/= 47/56 for reduced fall risk.  Baseline: 37/56 on 12-10-22 Goal status:  INITIAL  3.  Pt will decrease 5xSTS to </=13 seconds w/o BUE support in order to demonstrate decreased risk for falls and improved functional bilateral LE strength and power. Baseline: 15.45 sec without UE support (01/15/23); 16.29 sec w/o UE support  Goal status: NOT MET  4.  Pt will demonstrate a gait speed of >/=2.98 feet/sec using LRAD in order to decrease risk for falls. Baseline: 2.08 ft/sec w/ RW; 2.43 ft/sec without AD (01/15/23) Goal status: IN PROGRESS  5.  Pt will ambulate >/=500' over level indoor surfaces at modI level to improve endurance and safety with household mobility. Baseline: 125' w/ RW SBA; 870' without AD and SBA/CGA Goal status: MET  ASSESSMENT:  CLINICAL IMPRESSION: Assessed 5xSTS this visit w/ pt requiring increased time, 16.29 seconds, compared to most recent reassessment of 15.45 seconds w/o UE support.  This was likely due to prior warmup activity and some fatigue.  May benefit from reassessment at following session.  Continued work on dynamic balance w/ narrowed BOS and compliant surfaces with patient demonstrating improved righting reactions and decreased UE reliance.  Her primary limiting fatigue remains her decreased aerobic tolerance.  Will continue to address deficits to progress towards LTGs w/ plan to discharge at the end of current POC.  OBJECTIVE IMPAIRMENTS: Abnormal gait, decreased activity tolerance, decreased balance, decreased strength, increased muscle spasms, improper body mechanics, and postural dysfunction.   ACTIVITY LIMITATIONS: carrying, lifting, bending, squatting, stairs, transfers, bed mobility, and locomotion level  PARTICIPATION LIMITATIONS: meal prep, cleaning, laundry, medication management, driving, shopping, and community activity  PERSONAL FACTORS: Age, Fitness, Past/current experiences, Transportation, and  1-2 comorbidities: recent left THA and HTN  are also affecting patient's functional outcome.   REHAB POTENTIAL:  Good  CLINICAL DECISION MAKING: Stable/uncomplicated  EVALUATION COMPLEXITY: Low  PLAN:  PT FREQUENCY: 2x/week  PT DURATION: 8 weeks  PLANNED INTERVENTIONS: Therapeutic exercises, Therapeutic activity, Neuromuscular re-education, Balance training, Gait training, Patient/Family education, Self Care, Joint mobilization, Stair training, Vestibular training, DME instructions, Manual therapy, and Re-evaluation  PLAN FOR NEXT SESSION:  Right NMR, LLE strengthening, balance training-maybe manual dual task, compliant/unlevel surfaces, dynamic tasks, stair training, eyes closed, all endurance tasks and BREATHING, trial backwards tandem again, tilt board cone taps, SLS unsupported tasks, aerobic focused tasks, Begin assessing LTGs for remaining 3 visits-may want to reassess 5xSTS d/t fatigue.   Bary Richard, PT, DPT 01/29/2023, 3:35 PM

## 2023-01-29 NOTE — Therapy (Signed)
OUTPATIENT SPEECH LANGUAGE PATHOLOGY TREATMENT   Patient Name: Kelsey Jacobs MRN: [id number] DOB:May 13, 1946, 77 y.o., female 21 Date: 01/29/2023  PCP: Larene Beach MD REFERRING PROVIDER: Gwendolyn Lima, FNP  END OF SESSION:  End of Session - 01/29/23 1536     Visit Number 17    Number of Visits 25    Date for SLP Re-Evaluation 02/19/23    Authorization Type medicare    SLP Start Time 1315    SLP Stop Time  1400    SLP Time Calculation (min) 45 min    Activity Tolerance Patient tolerated treatment well             Past Medical History:  Diagnosis Date   Medical history non-contributory    Past Surgical History:  Procedure Laterality Date   BACK SURGERY     FOOT SURGERY     INCISION AND DRAINAGE ABSCESS Left 03/29/2021   Procedure: INCISION AND DRAINAGE AXILLARY ABSCESS;  Surgeon: Jovita Kussmaul, MD;  Location: [facility];  Service: General;  Laterality: Left;   Patient Active Problem List   Diagnosis Date Noted   Axillary abscess 03/30/2021    ONSET DATE: 11/06/22   REFERRING DIAG: [id number] (ICD-10-CM) - Arterial ischemic stroke, MCA (middle cerebral artery), left, acute ([address]) Z74.09 (ICD-10-CM) - Other reduced mobility Z78.9 (ICD-10-CM) - Other specified health status I69.320 (ICD-10-CM) - Aphasia following cerebral infarction  THERAPY DIAG:  No diagnosis found.  Rationale for Evaluation and Treatment: Rehabilitation  SUBJECTIVE:   SUBJECTIVE STATEMENT: "She slept good last night" PAIN:  Are you having pain? No  OBJECTIVE:   TODAY'S TREATMENT:    01-29-23: Targeted verbal apraxia reading conversational phrases (3-4 words) with choral, repetition and placement cues - Kelsey Jacobs approximated 16/27 phrases with max A. Targeted verbal apraxia using SGD travel icons and carrier phrase "I went to..." and "I go to[place] with choral and placement cues, Jovita approximated 7/9 sentences.  Written expression targeted writing 2-3 word conversational phrases with  consistent extended time and frequent copy cues. Kelsey Jacobs exhibits frustration, I explained that she had only been writing 1 word nouns, and this is progressing to verbs and descriptive words.   01-27-23: Pt accompanied by spouse and son. Son reported that pt has been occasionally writing on SGD if she is struggling a lot to communicate with spoken word. SLP encouraged pt to use writing more frequently to supplement communication. Re-educated pt and family on purpose of SGD -- not only to practice speech, but also to support communication. SLP facilitated conversation on topic of pt's past travel experiences. Pt demonstrated ability to navigate device to locate appropriate icons and utilize whiteboard app for written communication. Pt id'd writing errors with rare min A and self corrected to closer target approximation with usual mod A. Despite spelling errors, clinician and fmaily able to understand pt's written communicative attempts with approximately 85% accuracy. Of note: pt was writing names of locations, which included difficulty-to-spell words.  HEP: Add more travel terminology to SGD.   01-22-23: Targeting word finding, written expression and multimodal communication generating grocery/ingredient list for 2 dishes she made, chicken Piccata and meatballs - Kelsey Jacobs wrote 7 ingredients for piccata with usual min to mod fill in the blank and written cues. She Id'd errors with rare min A, consistent mod A to correct. She wrote 6 ingredients/grocery list for meatballs with usual mod A to correct errors. Kelsey Jacobs correctly wrote 6/13 ingredients with no errors, others she approximated the word enough for me and spouse  to comprehend. She used SGD to indicate that she wanted to talk about travel, locating travel folder. Instructed son (pt instructions) to customize travel folder on her home page for access to this relevant topic. Using SGD, written expression, SLP questioning and written cues, Kelsey Jacobs took 4 turns in conversation  re: shopping and brands to ID and indicate clothing and handbag brand preferences.   01-20-23: Pt received her Touch Talk and brought it in today. She used SGD to tell me where she ate with her friend and we added another restaurant she frequents. Using SGD she Id'd 3 friends she has seen the past week.  Kelsey Jacobs used Teacher, English as a foreign language (writing, SGD and speech at word level) to ask about extending her visits with rare min A to use writing. Targeted word finding and verbal expression generating list of 12 items in personally relevant category (make up) with consistent gestures, written cues for auditory comprehension. She repeated each item with placement cues 3x, 9/12 words. Al reports successful use of SGD and written communication at home with family support to augment verbal expression. She wrote (approximated) 3 brands of personal care items and Al Id'd them quickly. Kelsey Jacobs continues to benefit from encouragement to expand her communicative settings. Instructed Kelsey Jacobs (see patient instructions) to practice with her doing internet searches to her favorite stores and texting.   01-17-23: Pt accompanied by spouse. SLP provided assist to backup SGD. Spouse reported that the pt used to be an avid reader. SLP linked audio book website to [place] and demonstrated how to navigate the [place] app to [name]. SLP discussed benefits of app because it allows for changing font size to support reading difficulty.  Pt reported that she has been feeling more anxious lately. She experienced overstimulation this session and yesterday, per husband.  Provided external visual support (white board) and consistent visual cues and models, pt produced targeted one-, two-, and three- syllable combinations with ~35% accuracy. SLP provided model to demonstrate hand tapping out syllables when speaking. Utilized apraxia strategies to improve accurate productions (slow rate, divide into syllables, provide models and visual aids).  Pt upset  during session d/t frustration with speech. She endorsed that her communication difficulty is "making me crazy". SLP supported pt with counseling and encouraged her to speak to her doctor about her concerns. Also discussed creating a journal for pt so that her family can help her track her progress/recovery.    PATIENT EDUCATION: Education details: Sees today's treatment and patient instructions, aphasia ed Person educated: Patient, Spouse, and Child(ren) Education method: Explanation, Demonstration, Verbal cues, and Handouts Education comprehension: verbalized understanding, verbal cues required, and needs further education   GOALS: Goals reviewed with patient? Yes  SHORT TERM GOALS: Target date: 12/25/22  Pt will name 5 family members with occasional mod A Baseline: Goal status: MET  2.  Pt will answer personally relevant yes/no questions accurately 85% with multimodal communication Baseline:  Goal status: MET  3.  Pt will ID 5 food and shopping preferences using multimodal communication with occasional min A Baseline:  Goal status: MET  4.  Pt will name 6 items in personally relevant categories using AAC/multimodal communication as needed with occasional mod A Baseline:  Goal status: NOT MET  5.  Family/caregivers will carryover 3 communication strategies to support pt's comprehension and expression Baseline:  Goal status: MET   LONG TERM GOALS: Target date: 02/18/22  Pt will initiate use mulitmodal communication to communicate wants/needs 3x over 1 week with usual min A from  caregivers Baseline:  Goal status: IN PROGRESS  2.  Pt or caregivers will customize 4 items on AAC system with rare min A  Baseline:  Goal status: MET  3.  Pt will use multimodal communication to communicate 3 grocery items and 3 personal care items she needs with occasional min A from family Baseline:  Goal status: IN PROGRESS  4.  Pt will access online shopping site she uses and purchase a  needed item with occasional  min A from family Baseline:  Goal status: IN PROGRESS  5.  Using compensatory strategies and multimodal communication, pt will take 3turns in conversation with occasional min A Baseline:  Goal status: IN PROGRESS  ASSESSMENT:  CLINICAL IMPRESSION: Patient is a 77 y.o. female who was seen today for aphasia. 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 Pt demonstrating improvement with spontaneous verbal productions. Responsive to verbal models. Imitation at simple phrase level with mod A and  continues to improve. Written expression also improving to more consistent use at home to augment attempted verbalizations. Continue to recommend utilization of SGD, as family reports success in communication with device. Improved written expression at the word level, with min to mod Samone is writing to augment communication as well as use SGD Continue to recommend skilled ST services.   OBJECTIVE IMPAIRMENTS: include attention, memory, aphasia, and apraxia. These impairments are limiting patient from return to work, managing medications, managing appointments, managing finances, household responsibilities, ADLs/IADLs, and effectively communicating at home and in community. Factors affecting potential to achieve goals and functional outcome are severity of impairments. Patient will benefit from skilled SLP services to address above impairments and improve overall function.  REHAB POTENTIAL: Good  PLAN:  SLP FREQUENCY: 2x/week  SLP DURATION: 12 weeks  PLANNED INTERVENTIONS: Language facilitation, Environmental controls, Cueing hierachy, Cognitive reorganization, Internal/external aids, Functional tasks, Multimodal communication approach, SLP instruction and feedback, Compensatory strategies, and Patient/family education   Shawntaya, Mccreless,  CCC-SLP 01/29/2023, 3:43 PM

## 2023-01-29 NOTE — Therapy (Signed)
OUTPATIENT OCCUPATIONAL THERAPY NEURO PROGRESS NOTE  Patient Name: Kelsey Jacobs MRN: [id number] DOB:12/21/1945, 77 y.o., female Today's Date: 01/29/2023  PCP: Dr. Vanessa Barbara  REFERRING PROVIDER: Levada Schilling, MD  END OF SESSION:  OT End of Session - 01/29/23 1407       Visit Number 112    Number of Visits 17     Date for OT Re-Evaluation 02/16/23     Authorization Type MCR, [employer]     Progress Note Due on Visit 10     OT Start Time 1402    OT Stop Time 1443    OT Time Calculation (min) 41 min     Activity Tolerance Patient tolerated treatment well     Behavior During Therapy WFL for tasks assessed/performed      Past Medical History:  Diagnosis Date   Medical history non-contributory    Past Surgical History:  Procedure Laterality Date   BACK SURGERY     FOOT SURGERY     INCISION AND DRAINAGE ABSCESS Left 03/29/2021   Procedure: INCISION AND DRAINAGE AXILLARY ABSCESS;  Surgeon: Autumn Messing III, MD;  Location: [facility];  Service: General;  Laterality: Left;   Patient Active Problem List   Diagnosis Date Noted   Axillary abscess 03/30/2021    ONSET DATE: 12/04/2022 referral date  REFERRING DIAG:  Diagnosis  I63.512 (ICD-10-CM) - Cerebral infarction due to unspecified occlusion or stenosis of left middle cerebral artery  Z74.09 (ICD-10-CM) - Other reduced mobility  Z78.9 (ICD-10-CM) - Other specified health status  I69.320 (ICD-10-CM) - Aphasia following cerebral infarction    THERAPY DIAG:  THERAPY DIAG:  Muscle weakness (generalized)  Hemiplegia and hemiparesis following cerebral infarction affecting right dominant side ([place])  Visuospatial deficit  Attention and concentration deficit   Rationale for Evaluation and Treatment: Rehabilitation  SUBJECTIVE:   SUBJECTIVE STATEMENT: She really finds it challenging to complete puzzles.  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  4/10 RLE  FALLS: Has patient fallen in last 6 months? No  LIVING ENVIRONMENT: Lives with: lives with their spouse and lives with their son-son will be leaving eventually, but is there indefinitely  Lives in: House/apartment (pt lives on first floor)  Stairs: Yes: Internal: 15 steps; on left going up and External: 2 steps; uses handheld assist and door frame Has following equipment at home: Quad cane small base, Walker - 2 wheeled, bed side commode, Grab bars, and transport chair, chair alarm, bed alarm, call button for son, insertable bed rails   PLOF: Independent with transfers, Needs assistance with ADLs, Needs assistance with homemaking, Needs assistance with gait, and typically has supervision with transfers   PATIENT GOALS: PT asks pt if she would like to get stronger to which she nods.  Son would like for mom to get back to her normal routine safely.  And to be able to leave her alone and know she is safe and for her to transition to the cane just to be a bit more independent.    OBJECTIVE: (from evaluation unless otherwise noted)  HAND DOMINANCE: Right  ADLs: Overall ADLs: mostly supervision/cueing, min assist at times Transfers/ambulation related to ADLs: Eating: mod I  Grooming: mod I  UB Dressing: mod I  LB Dressing: min assist to don Lt foot in pants and  occasionally Lt sock, slip on shoes Toileting: mod I (occasional assist for perineal care w/ BM)  Bathing: set up, cues for thoroughness Tub Shower transfers: supervision/cues Equipment:  BSC for shower seat, grab bars  IADLs: Shopping: husband has done for several years Light housekeeping: dependent, has cleaning lady for heavier cleaning 1x/month Meal Prep: dependent at this time Community mobility: not currently driving - relying on family Medication  management: husband administering Financial management: son has been doing Handwriting: 100% legible and print, name only  MOBILITY STATUS:  uses walker   UPPER EXTREMITY ROM:  BUE AROM WNLs   UPPER EXTREMITY MMT:   RUE grossly 4/5, LUE MMT grossly 5/5   HAND FUNCTION: Grip strength: Right: 38.1 lbs; Left: 33.0 lbs  COORDINATION: 9 Hole Peg test: Right: 28 sec; Left: 26 sec  SENSATION: Slightly dulled RUE, pt appears to detect stimuli and localization  EDEMA: none   COGNITION: Overall cognitive status: Difficulty to assess due to: Communication impairment (aphasia). Pt can follow 1 step visual cues/demo  VISION: Subjective report: bluriness since stroke Baseline vision: Wears glasses all the time Visual history: corrective eye surgery  VISION ASSESSMENT: Not tested D/t aphasia   PERCEPTION: Not tested  PRAXIS: Not tested  OBSERVATIONS: Pt mostly limited by Mervin Hack, overall needs sup/min assist for BADLS  TODAY'S TREATMENT:                                                                                                                              - Neuro re-education completed for duration as noted below including:   OT placed 6 BlazePods in front of patient and had patient tap pods with palm of hand and bimanually as pods lit up using focus mode for improved processing, scanning and locating of items, reaction time, upper extremity range of motion, gross motor coordination and bimanual coordination/trunk control.  Pt had 0 strikes and 74 hits in 2 minutes with average reaction time of 1420m using R hand Pt had 0 strikes and 68 hits in 2 minutes with average reaction time of 1577 ms using 2 hands  Pt participated in Memory game (20 pieces) x 2 requiring R coordination, scanning, ROM, and verbalization of animal after finding a match. Increased time and cueing required for completion.    Pt assembled 7 Pieces of [place] for RUE coordination and visuospatial  relations.   PATIENT EDUCATION: Education details:RUE and cognitive rehab Person educated: Patient and Spouse Education method: Explanation, Demonstration, Tactile cues, Verbal cues, and Handouts Education comprehension: verbalized understanding, returned demonstration, verbal cues required, tactile cues required, and needs further education  HOME EXERCISE PROGRAM: 1/12 - dowel HEP 1/31 - RUE yellow theraband   GOALS: Goals reviewed with patient? Yes  SHORT TERM GOALS: Target date: 01/18/23  Independent with HEP for RUE strengthening Baseline: Goal status: MET  2.  Pt to perform LE dressing (donning pants and Lt sock) w/ AE and no more than min cues Baseline:  Goal status: MET  3.  Pt to prepare simple snack, sandwich, or microwaveable items with min cues and direct sup Baseline:  Goal status: IN PROGRESS  4.  Pt to perform bathing and perineal care with no more than min cues/sup Baseline:  Goal status: MET   LONG TERM GOALS: Target date: 02/16/23  Independent with all BADLS Baseline:  [id number]: Supervision to intermittent min A Goal status: IN PROGRESS  2.  Pt to perform familiar stovetop meal with direct supervision and min cues prn Baseline:  2/14: planning to cook stovetop pasta meal this weekend Goal status: IN PROGRESS  3.  Pt to consistently perform light IADLS including washing dishes and laundry Baseline:  01/20/2023 - has been doing laundry and putting away dishes (uses dishwasher) Goal status: IN PROGRESS  ASSESSMENT:  CLINICAL IMPRESSION: Pt is continuing to make physical gains with improve balance and participation in ADLS. Pt limited by aphasia  PERFORMANCE DEFICITS: in functional skills including ADLs, IADLs, coordination, dexterity, sensation, strength, mobility, body mechanics, decreased knowledge of use of DME, and UE functional use, cognitive skills including attention and safety awareness, and psychosocial skills including coping strategies.    IMPAIRMENTS: are limiting patient from ADLs, IADLs, leisure, and social participation.   CO-MORBIDITIES: may have co-morbidities  that affects occupational performance. Patient will benefit from skilled OT to address above impairments and improve overall function.  REHAB POTENTIAL: Good  PLAN:  OT FREQUENCY: 2x/week  OT DURATION: 8 weeks, plus evaluation (may only need 6 weeks)   PLANNED INTERVENTIONS: self care/ADL training, therapeutic exercise, therapeutic activity, neuromuscular re-education, balance training, functional mobility training, moist heat, patient/family education, cognitive remediation/compensation, visual/perceptual remediation/compensation, coping strategies training, and DME and/or AE instructions  RECOMMENDED OTHER SERVICES: none at this time  CONSULTED AND AGREED WITH PLAN OF CARE: Patient and family member/caregiver  PLAN FOR NEXT SESSION:  env scanning review; higher level cognition, progress towards remaining goals; review ADLs   Dennis Bast, OT 01/29/2023, 1:43 PM

## 2023-01-31 ENCOUNTER — Encounter: Payer: Self-pay | Admitting: Occupational Therapy

## 2023-02-03 ENCOUNTER — Ambulatory Visit: Payer: Medicare Other | Admitting: Speech Pathology

## 2023-02-03 ENCOUNTER — Encounter: Payer: Self-pay | Admitting: Physical Therapy

## 2023-02-03 ENCOUNTER — Ambulatory Visit: Payer: Medicare Other | Admitting: Physical Therapy

## 2023-02-03 ENCOUNTER — Encounter: Payer: Self-pay | Admitting: Occupational Therapy

## 2023-02-03 ENCOUNTER — Ambulatory Visit: Payer: Medicare Other | Admitting: Occupational Therapy

## 2023-02-03 DIAGNOSIS — R41842 Visuospatial deficit: Secondary | ICD-10-CM

## 2023-02-03 DIAGNOSIS — M6281 Muscle weakness (generalized): Secondary | ICD-10-CM

## 2023-02-03 DIAGNOSIS — R2681 Unsteadiness on feet: Secondary | ICD-10-CM

## 2023-02-03 DIAGNOSIS — R4701 Aphasia: Secondary | ICD-10-CM | POA: Diagnosis not present

## 2023-02-03 DIAGNOSIS — R2689 Other abnormalities of gait and mobility: Secondary | ICD-10-CM

## 2023-02-03 DIAGNOSIS — I69351 Hemiplegia and hemiparesis following cerebral infarction affecting right dominant side: Secondary | ICD-10-CM

## 2023-02-03 DIAGNOSIS — R41840 Attention and concentration deficit: Secondary | ICD-10-CM

## 2023-02-03 NOTE — Therapy (Signed)
OUTPATIENT SPEECH LANGUAGE PATHOLOGY TREATMENT   Patient Name: Kelsey Jacobs MRN: [id number] DOB:1946/04/11, 77 y.o., female 17 Date: 02/03/2023  PCP: Kelsey Beach MD REFERRING PROVIDER: Gwendolyn Lima, FNP  END OF SESSION:  End of Session - 02/03/23 1311     Visit Number 18    Number of Visits 25    Date for SLP Re-Evaluation 02/19/23    Authorization Type medicare             Past Medical History:  Diagnosis Date   Medical history non-contributory    Past Surgical History:  Procedure Laterality Date   BACK SURGERY     FOOT SURGERY     INCISION AND DRAINAGE ABSCESS Left 03/29/2021   Procedure: INCISION AND DRAINAGE AXILLARY ABSCESS;  Surgeon: Kelsey Kussmaul, MD;  Location: [facility];  Service: General;  Laterality: Left;   Patient Active Problem List   Diagnosis Date Noted   Axillary abscess 03/30/2021    ONSET DATE: 11/06/22   REFERRING DIAG: [id number] (ICD-10-CM) - Arterial ischemic stroke, MCA (middle cerebral artery), left, acute ([place]) Z74.09 (ICD-10-CM) - Other reduced mobility Z78.9 (ICD-10-CM) - Other specified health status I69.320 (ICD-10-CM) - Aphasia following cerebral infarction  THERAPY DIAG:  Aphasia  Rationale for Evaluation and Treatment: Rehabilitation  SUBJECTIVE:   SUBJECTIVE STATEMENT: "Kelsey Jacobs added a bunch of phrases for her to practice" PAIN:  Are you having pain? No  OBJECTIVE:   TODAY'S TREATMENT:     02-03-23: Son added conversational phrases to SGD for home practice - I added to these 3-5 word phrases (I'll have water, how was work, how was your day, how is your family, pass the bread) to target verbal apraxia in  conversational and personally relevant phrases. With fading choral and placement cues, Kelsey Jacobs approximated phrases 2-3x each. Written expression at phrase level targeted writing conversational phrases with usual mod written cues (phrase shown to her then removed prior to her writing) She wrote 10 phrases with 80%  accuracy, she ID's error in the phrase however requires max A copy cues to self correct.   01-29-23: Targeted verbal apraxia reading conversational phrases (3-4 words) with choral, repetition and placement cues - Kelsey Jacobs approximated 16/27 phrases with max A. Targeted verbal apraxia using SGD travel icons and carrier phrase "I went to..." and "I go to[place] with choral and placement cues, Kelsey Jacobs approximated 7/9 sentences.  Written expression targeted writing 2-3 word conversational phrases with consistent extended time and frequent copy cues. Kelsey Jacobs exhibits frustration, I explained that she had only been writing 1 word nouns, and this is progressing to verbs and descriptive words.   01-27-23: Pt accompanied by spouse and son. Son reported that pt has been occasionally writing on SGD if she is struggling a lot to communicate with spoken word. SLP encouraged pt to use writing more frequently to supplement communication. Re-educated pt and family on purpose of SGD -- not only to practice speech, but also to support communication. SLP facilitated conversation on topic of pt's past travel experiences. Pt demonstrated ability to navigate device to locate appropriate icons and utilize whiteboard app for written communication. Pt id'd writing errors with rare min A and self corrected to closer target approximation with usual mod A. Despite spelling errors, clinician and fmaily able to understand pt's written communicative attempts with approximately 85% accuracy. Of note: pt was writing names of locations, which included difficulty-to-spell words.  HEP: Add more travel terminology to SGD.   01-22-23: Targeting word finding, written expression and multimodal  communication generating grocery/ingredient list for 2 dishes she made, chicken Piccata and meatballs - Kelsey Jacobs wrote 7 ingredients for piccata with usual min to mod fill in the blank and written cues. She Id'd errors with rare min A, consistent mod A to correct. She wrote 6  ingredients/grocery list for meatballs with usual mod A to correct errors. Kelsey Jacobs correctly wrote 6/13 ingredients with no errors, others she approximated the word enough for me and spouse to comprehend. She used SGD to indicate that she wanted to talk about travel, locating travel folder. Instructed son (pt instructions) to customize travel folder on her home page for access to this relevant topic. Using SGD, written expression, SLP questioning and written cues, Kelsey Jacobs took 4 turns in conversation re: shopping and brands to ID and indicate clothing and handbag brand preferences.   01-20-23: Pt received her Touch Talk and brought it in today. She used SGD to tell me where she ate with her friend and we added another restaurant she frequents. Using SGD she Id'd 3 friends she has seen the past week.  Kelsey Jacobs used Teacher, English as a foreign language (writing, SGD and speech at word level) to ask about extending her visits with rare min A to use writing. Targeted word finding and verbal expression generating list of 12 items in personally relevant category (make up) with consistent gestures, written cues for auditory comprehension. She repeated each item with placement cues 3x, 9/12 words. Kelsey Jacobs reports successful use of SGD and written communication at home with family support to augment verbal expression. She wrote (approximated) 3 brands of personal care items and Kelsey Jacobs Id'd them quickly. Kelsey Jacobs continues to benefit from encouragement to expand her communicative settings. Instructed Kelsey Jacobs (see patient instructions) to practice with her doing internet searches to her favorite stores and texting.   01-17-23: Pt accompanied by spouse. SLP provided assist to backup SGD. Spouse reported that the pt used to be an avid reader. SLP linked audio book website to Kelsey Jacobs and demonstrated how to navigate the [place] app to Kelsey Jacobs. SLP discussed benefits of app because it allows for changing font size to support reading difficulty.  Pt reported that she  has been feeling more anxious lately. She experienced overstimulation this session and yesterday, per husband.  Provided external visual support (white board) and consistent visual cues and models, pt produced targeted one-, two-, and three- syllable combinations with ~35% accuracy. SLP provided model to demonstrate hand tapping out syllables when speaking. Utilized apraxia strategies to improve accurate productions (slow rate, divide into syllables, provide models and visual aids).  Pt upset during session d/t frustration with speech. She endorsed that her communication difficulty is "making me crazy". SLP supported pt with counseling and encouraged her to speak to her doctor about her concerns. Also discussed creating a journal for pt so that her family can help her track her progress/recovery.    PATIENT EDUCATION: Education details: Sees today's treatment and patient instructions, aphasia ed Person educated: Patient, Spouse, and Child(ren) Education method: Explanation, Demonstration, Verbal cues, and Handouts Education comprehension: verbalized understanding, verbal cues required, and needs further education   GOALS: Goals reviewed with patient? Yes  SHORT TERM GOALS: Target date: 12/25/22  Pt will name 5 family members with occasional mod A Baseline: Goal status: MET  2.  Pt will answer personally relevant yes/no questions accurately 85% with multimodal communication Baseline:  Goal status: MET  3.  Pt will ID 5 food and shopping preferences using multimodal communication with occasional min A Baseline:  Goal  status: MET  4.  Pt will name 6 items in personally relevant categories using AAC/multimodal communication as needed with occasional mod A Baseline:  Goal status: NOT MET  5.  Family/caregivers will carryover 3 communication strategies to support pt's comprehension and expression Baseline:  Goal status: MET   LONG TERM GOALS: Target date: 02/18/22  Pt will initiate  use mulitmodal communication to communicate wants/needs 3x over 1 week with usual min A from caregivers Baseline:  Goal status: IN PROGRESS  2.  Pt or caregivers will customize 4 items on AAC system with rare min A  Baseline:  Goal status: MET  3.  Pt will use multimodal communication to communicate 3 grocery items and 3 personal care items she needs with occasional min A from family Baseline:  Goal status: IN PROGRESS  4.  Pt will access online shopping site she uses and purchase a needed item with occasional  min A from family Baseline:  Goal status: IN PROGRESS  5.  Using compensatory strategies and multimodal communication, pt will take 3turns in conversation with occasional min A Baseline:  Goal status: IN PROGRESS  ASSESSMENT:  CLINICAL IMPRESSION: Patient is a 77 y.o. female who was seen today for aphasia. 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 Pt demonstrating improvement with spontaneous verbal productions. Responsive to verbal models. Imitation at simple phrase level with mod A and  continues to improve. Written expression also improving to more consistent use at home to augment attempted verbalizations. Continue to recommend utilization of SGD, as family reports success in communication with device. Improved written expression at the word level, with min to mod Kadidiatou is writing to augment communication as well as use SGD Continue to recommend skilled ST services.   OBJECTIVE IMPAIRMENTS: include attention, memory, aphasia, and apraxia. These impairments are limiting patient from return to work, managing medications, managing appointments, managing finances, household responsibilities, ADLs/IADLs, and effectively communicating at home and in community. Factors affecting potential to achieve goals and functional outcome are severity of impairments.  Patient will benefit from skilled SLP services to address above impairments and improve overall function.  REHAB POTENTIAL: Good  PLAN:  SLP FREQUENCY: 2x/week  SLP DURATION: 12 weeks  PLANNED INTERVENTIONS: Language facilitation, Environmental controls, Cueing hierachy, Cognitive reorganization, Internal/external aids, Functional tasks, Multimodal communication approach, SLP instruction and feedback, Compensatory strategies, and Patient/family education   Danayja, Vanhout, CCC-SLP 02/03/2023, 2:03 PM

## 2023-02-03 NOTE — Therapy (Signed)
OUTPATIENT PHYSICAL THERAPY NEURO TREATMENT NOTE/RE-CERT   Patient Name: Kelsey Jacobs MRN: [id number] DOB:Dec 25, 1945, 77 y.o., female Today's Date: 02/03/2023  PCP: Dr. Michell Heinrich [facility]) [place] PROVIDER: Levada Schilling, MD  END OF SESSION:   PT End of Session - 02/03/23 1450     Visit Number 17    Number of Visits 18   17+1 for discharge   Date for PT Re-Evaluation 02/07/23   pushed out due to potential scheduling delay   Authorization Type MEDICARE PART A AND B    Progress Note Due on Visit 20    PT Start Time 1450    PT Stop Time 1525    PT Time Calculation (min) 35 min    Equipment Utilized During Treatment Gait belt    Activity Tolerance Patient tolerated treatment well;Patient limited by fatigue    Behavior During Therapy WFL for tasks assessed/performed            Past Medical History:  Diagnosis Date   Medical history non-contributory    Past Surgical History:  Procedure Laterality Date   BACK SURGERY     FOOT SURGERY     INCISION AND DRAINAGE ABSCESS Left 03/29/2021   Procedure: INCISION AND DRAINAGE AXILLARY ABSCESS;  Surgeon: Jovita Kussmaul, MD;  Location: [facility];  Service: General;  Laterality: Left;   Patient Active Problem List   Diagnosis Date Noted   Axillary abscess 03/30/2021    ONSET DATE: 11/28/2022  REFERRING DIAG: R26.9 (ICD-10-CM) - Gait abnormality  THERAPY DIAG:  Muscle weakness (generalized) - Plan: PT plan of care cert/re-cert  Hemiplegia and hemiparesis following cerebral infarction affecting right dominant side ([place]) - Plan: PT plan of care cert/re-cert  Other abnormalities of gait and mobility - Plan: PT plan of care cert/re-cert  Unsteadiness on feet - Plan: PT plan of care cert/re-cert  Rationale for Evaluation and Treatment: Rehabilitation  SUBJECTIVE:                                                                                                                                                                                              SUBJECTIVE STATEMENT:   She primarily ambulates with the cane, but presents carrying the cane without reliance.  She denies falls or near falls. Husband states she has not been endorsing much RLE pain to him, pt indicates it comes and goes.  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 "Not really"  PRECAUTIONS: Fall-anterior hip precautions have  been lifted  WEIGHT BEARING RESTRICTIONS: No  FALLS: Has patient fallen in last 6 months? No  LIVING ENVIRONMENT: Lives with: lives with their spouse and lives with their son-son will be leaving eventually, but is there indefinitely  Lives in: House/apartment Stairs: Yes: Internal: 15 steps; on left going up and External: 2 steps; uses handheld assist and door frame Has following equipment at home: Quad cane small base, Walker - 2 wheeled, bed side commode, Grab bars, and transport chair, chair alarm, bed alarm, call button for son, insertable bed rails  PLOF: Independent with transfers, Needs assistance with ADLs, Needs assistance with homemaking, Needs assistance with gait, and typically has supervision with transfers  PATIENT GOALS: PT asks pt if she would like to get stronger to which she nods.  Son would like for mom to get back to her normal routine safely.  And to be able to leave her alone and know she is safe and for her to transition to the cane just to be a bit more independent.  OBJECTIVE:  -5xSTS w/o UE support:  14.28 seconds -Ambulation x600' independently no AD, pt less dyspneic following than sessions prior, some mild RLE decreased stance time as distance increased w/ pt reporting leg just feels heavy, no overt LOB -Therastone obstacle course x6 progressing from BUE support to RUE support only, no LOB, but truncal sway w/ taller  stones -Step to dot target w/ ipsilateral bean bag toss to target 2 meters away x2 rounds switching LE step forward on each round -STS w/ 3.3lb ball push-to-target x12 for aerobic tolerance -Forward step w/ lateral ball toss to left and return to midline, pt endorses the right leg is very tired and she would like to finish up.  Pt has difficulty sequencing task w/o one step commands.  PATIENT EDUCATION: Education details: Continue use of cane as desired and for longer distances, but discussed that pt is safe to trial ambulation w/o cane outside the home with close family supervision to build tolerance and acclimate to busier environments slowly and safely.  Discussion of upcoming discharge plan for next visit due to good patient progress.  Person educated: Patient and Spouse Education method: Customer service manager Education comprehension: verbalized understanding, returned demonstration, and needs further education  HOME EXERCISE PROGRAM:  Access Code: [email or web address] URL: [email or web address] Date: 01/08/2023 Prepared by: Elease Etienne  Exercises - Seated Hamstring Stretch  - 1 x daily - 5 x weekly - 1 sets - 2-3 reps - 45 seconds hold - Seated Single Knee to Chest  - 1 x daily - 5 x weekly - 1 sets - 2-3 reps - 45 seconds hold - Standing Hip Flexor Stretch  - 1 x daily - 5 x weekly - 1 sets - 2-3 reps - 45 seconds hold - Standing Hip Extension with Leg Bent and Support  - 1 x daily - 5 x weekly - 1 sets - 10 reps - Forward Backward Monster Walk with Band at [employer] and Counter Support  - 1 x daily - 5 x weekly - 3 sets - 10 reps - Tandem Walking with Counter Support  - 1 x daily - 5 x weekly - 3 sets - 10 reps - Backward Walking with Counter Support  - 1 x daily - 5 x weekly - 3 sets - 10 reps  GOALS: Goals reviewed with patient? Yes  SHORT TERM GOALS: Target date: 01/03/2023  Pt will perform strength, stretching, and balance HEP with supervision from family to  maintain  progress. Baseline:  Revised based on current functional level 01/01/2023 Goal status: MET  2.  Increase Berg score to >/ 42/56 to demo improvement in balance. Baseline: 37/56 (1/2); 46/56 (1/24) Goal status: MET  3.  Pt will decrease 5xSTS to </=13 seconds w/ BUE support in order to demonstrate decreased risk for falls and improved functional bilateral LE strength and power. Baseline: 16.86 sec w/ BUE support; 13.47 sec w/ BUE support (01/01/2023) Goal status: IN PROGRESS  4.  Pt will demonstrate a gait speed of >/=2.5 feet/sec using LRAD in order to decrease risk for falls. Baseline: 2.08 ft/sec w/ RW; 2.57 ft/sec w/ RW (1/24) Goal status: MET  5.  Pt will negotiation 2 stairs w/o rail using LRAD at no more than supervision level in order to improve safe accessibility to home environment. Baseline: Pt needs minA at home to access home via garage or front door; son reports they built a rail and pt is safest with this option at this time (01/02/2023) Goal status: REVISED-D/C'd  LONG TERM GOALS: Target date: 02/07/2023  Pt will increase FOTO score to 69% in order to demonstrate subjective functional improvement. Baseline: 54% Goal status: INITIAL  2.  BERG to be assessed w/ STG/LTG set as appropriate.  Increase Berg score to >/= 47/56 for reduced fall risk.  Baseline: 37/56 on 12-10-22 Goal status: INITIAL  3.  Pt will decrease 5xSTS to </=13 seconds w/o BUE support in order to demonstrate decreased risk for falls and improved functional bilateral LE strength and power. Baseline: 15.45 sec without UE support (01/15/23); 16.29 sec w/o UE support; 14.28 sec w/o UE support (02/03/2023) Goal status: PARTIALLY MET  4.  Pt will demonstrate a gait speed of >/=2.98 feet/sec using LRAD in order to decrease risk for falls. Baseline: 2.08 ft/sec w/ RW; 2.43 ft/sec without AD (01/15/23) Goal status: IN PROGRESS  5.  Pt will ambulate >/=500' over level indoor surfaces at modI level to improve  endurance and safety with household mobility. Baseline: 125' w/ RW SBA; 870' without AD and SBA/CGA; 600' w/o AD independently (02/03/2023) Goal status: MET  ASSESSMENT:  CLINICAL IMPRESSION: Reassessed 5xSTS this visit prior to other activity to prevent fatigue from affecting patient's performance with pt completing in improved time of 14.28 seconds w/o UE support compared to over 16 seconds last session.  She ambulates 600' w/o AD independently w/ some fatigue of the RLE noted with increased distance, but patient maintains adequate balance and displays improved general cardiovascular tolerance to ambulation.  Focused remainder of session on increasing cognitive challenge of dynamic tasks w/ multistep tasks.  Pt tolerates these challenges well from a balance standpoint continuing to require single step commands and redirection to task for success with sequencing.  Overall, she has made great strides with PT and is prepared to discharge at remaining visit.  OBJECTIVE IMPAIRMENTS: Abnormal gait, decreased activity tolerance, decreased balance, decreased strength, increased muscle spasms, improper body mechanics, and postural dysfunction.   ACTIVITY LIMITATIONS: carrying, lifting, bending, squatting, stairs, transfers, bed mobility, and locomotion level  PARTICIPATION LIMITATIONS: meal prep, cleaning, laundry, medication management, driving, shopping, and community activity  PERSONAL FACTORS: Age, Fitness, Past/current experiences, Transportation, and 1-2 comorbidities: recent left THA and HTN  are also affecting patient's functional outcome.   REHAB POTENTIAL: Good  CLINICAL DECISION MAKING: Stable/uncomplicated  EVALUATION COMPLEXITY: Low  PLAN:  PT FREQUENCY: 2x/week   PT DURATION: 8 weeks + 1 week/1 visit to cover discharge visit  PLANNED INTERVENTIONS: Therapeutic exercises, Therapeutic activity, Neuromuscular  re-education, Balance training, Gait training, Patient/Family education,  Self Care, Joint mobilization, Stair training, Vestibular training, DME instructions, Manual therapy, and Re-evaluation  PLAN FOR NEXT SESSION:  ASSESS LTGs-D/C!   Bary Richard, PT, DPT 02/03/2023, 4:54 PM

## 2023-02-03 NOTE — Therapy (Signed)
OUTPATIENT OCCUPATIONAL THERAPY NEURO TREATMENT  Patient Name: Kelsey Jacobs MRN: [id number] DOB:1946/04/24, 77 y.o., female Today's Date: 02/03/2023  PCP: Dr. Vanessa Barbara  REFERRING PROVIDER: Levada Schilling, MD  END OF SESSION:  OT End of Session - 02/03/23 1404     Visit Number 13    Number of Visits 17    Date for OT Re-Evaluation 02/16/23    Authorization Type MCR, UHC    Progress Note Due on Visit 10    OT Start Time 1404    Activity Tolerance Patient tolerated treatment well    Behavior During Therapy [facility] for tasks assessed/performed             Past Medical History:  Diagnosis Date   Medical history non-contributory    Past Surgical History:  Procedure Laterality Date   BACK SURGERY     FOOT SURGERY     INCISION AND DRAINAGE ABSCESS Left 03/29/2021   Procedure: INCISION AND DRAINAGE AXILLARY ABSCESS;  Surgeon: Jovita Kussmaul, MD;  Location: [facility];  Service: General;  Laterality: Left;   Patient Active Problem List   Diagnosis Date Noted   Axillary abscess 03/30/2021    ONSET DATE: 12/04/2022 referral date  REFERRING DIAG:  Diagnosis  I63.512 (ICD-10-CM) - Cerebral infarction due to unspecified occlusion or stenosis of left middle cerebral artery  Z74.09 (ICD-10-CM) - Other reduced mobility  Z78.9 (ICD-10-CM) - Other specified health status  I69.320 (ICD-10-CM) - Aphasia following cerebral infarction    THERAPY DIAG:  THERAPY DIAG:  Muscle weakness (generalized)  Hemiplegia and hemiparesis following cerebral infarction affecting right dominant side (HCC)  Visuospatial deficit  Attention and concentration deficit   Rationale for Evaluation and Treatment: Rehabilitation  SUBJECTIVE:   SUBJECTIVE STATEMENT: She really finds it challenging to complete puzzles.  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  4/10 RLE  FALLS: Has patient fallen in last 6 months? No  LIVING ENVIRONMENT: Lives with: lives with their spouse and lives with their son-son will be leaving eventually, but is there indefinitely  Lives in: House/apartment (pt lives on first floor)  Stairs: Yes: Internal: 15 steps; on left going up and External: 2 steps; uses handheld assist and door frame Has following equipment at home: Quad cane small base, Walker - 2 wheeled, bed side commode, Grab bars, and transport chair, chair alarm, bed alarm, call button for son, insertable bed rails   PLOF: Independent with transfers, Needs assistance with ADLs, Needs assistance with homemaking, Needs assistance with gait, and typically has supervision with transfers   PATIENT GOALS: PT asks pt if she would like to get stronger to which she nods.  Son would like for mom to get back to her normal routine safely.  And to be able to leave her alone and know she is safe and for her to transition to the cane just to be a bit more independent.    OBJECTIVE: (from evaluation unless otherwise noted)  HAND DOMINANCE: Right  ADLs: Overall ADLs: mostly supervision/cueing, min assist at times Transfers/ambulation related to ADLs: Eating: mod I  Grooming: mod I  UB Dressing: mod I  LB Dressing: min assist to don Lt foot in pants and occasionally Lt sock, slip on shoes Toileting: mod I (occasional assist for perineal care w/ BM)  Bathing:  set up, cues for thoroughness Tub Shower transfers: supervision/cues Equipment:  BSC for shower seat, grab bars  IADLs: Shopping: husband has done for several years Light housekeeping: dependent, has cleaning lady for heavier cleaning 1x/month Meal Prep: dependent at this time Community mobility: not currently driving - relying on family Medication management: husband administering Financial management: son has been  doing Handwriting: 100% legible and print, name only  MOBILITY STATUS:  uses walker   UPPER EXTREMITY ROM:  BUE AROM WNLs   UPPER EXTREMITY MMT:   RUE grossly 4/5, LUE MMT grossly 5/5   HAND FUNCTION: Grip strength: Right: 38.1 lbs; Left: 33.0 lbs  COORDINATION: 9 Hole Peg test: Right: 28 sec; Left: 26 sec  SENSATION: Slightly dulled RUE, pt appears to detect stimuli and localization  EDEMA: none  COGNITION: Overall cognitive status: Difficulty to assess due to: Communication impairment (aphasia). Pt can follow 1 step visual cues/demo  VISION: Subjective report: bluriness since stroke Baseline vision: Wears glasses all the time Visual history: corrective eye surgery  VISION ASSESSMENT: Not tested D/t aphasia  PERCEPTION: Not tested  PRAXIS: Not tested  OBSERVATIONS: Pt mostly limited by Mervin Hack, overall needs sup/min assist for BADLS  TODAY'S TREATMENT:                                                                                                                              - Neuro re-education completed for duration as noted below including: Pt completed arm bike in sitting for 5 minutes with average RPM of 20 at level 4 for endurance, ROM, and strengthening of affected extremity. Pt alternating direction of pedaling halfway through. Intermittent cues provided to maintain stability with respect to anterior/posterior trunk lean and consistent grasp maintenance.   R gross grasp to pick up colored blocks and place into bowl with 15 pound hand grip for strengthening of affected extremity. Unable to tolerate heavier weight and requiring cues for hand placement.  Using red Power Web, patient stretched wrist in extension for 1 minute each to help with ROM and strength using R hand. With use of rd Power Web, patient completed R composite flexion for ROM and strengthening x 10 with 5 second holds.    Pt completed 15 reps of 1 lb shoulder press to elbow ext/bicep curl  progression for RUE strengthening. Pt required increased time for completion and cueing for sequencing Patient picked up single golf ball in palm of R hand without use of digits for intrinsic strengthening, supinated golf ball, and then placed ball onto table x10 repetitions.  - Therapeutic activities completed for duration as noted below including: Therapist and patient participated in 6 games of Connect 4 requiring patient to play pieces with affected R hand for fine motor coordination and pinch strength as well as item recognition and processing requiring increased time and cueing to block therapist's moves  PATIENT EDUCATION: Education details:RUE and cognitive rehab Person educated: Patient and Spouse Education method: Explanation, Demonstration, Corporate treasurer  cues, Verbal cues, and Handouts Education comprehension: verbalized understanding, returned demonstration, verbal cues required, tactile cues required, and needs further education  HOME EXERCISE PROGRAM: 1/12 - dowel HEP 1/31 - RUE yellow theraband   GOALS: Goals reviewed with patient? Yes  SHORT TERM GOALS: Target date: 01/18/23  Independent with HEP for RUE strengthening Baseline: Goal status: MET  2.  Pt to perform LE dressing (donning pants and Lt sock) w/ AE and no more than min cues Baseline:  Goal status: MET  3.  Pt to prepare simple snack, sandwich, or microwaveable items with min cues and direct sup Baseline:  Goal status: IN PROGRESS  4.  Pt to perform bathing and perineal care with no more than min cues/sup Baseline:  Goal status: MET   LONG TERM GOALS: Target date: 02/16/23  Independent with all BADLS Baseline:  [id number]: Supervision to intermittent min A Goal status: IN PROGRESS  2.  Pt to perform familiar stovetop meal with direct supervision and min cues prn Baseline:  2/14: planning to cook stovetop pasta meal this weekend Goal status: IN PROGRESS  3.  Pt to consistently perform light IADLS including  washing dishes and laundry Baseline:  01/20/2023 - has been doing laundry and putting away dishes (uses dishwasher) Goal status: IN PROGRESS  ASSESSMENT:  CLINICAL IMPRESSION: Pt is continuing to make physical gains with improve balance and participation in ADLS. Pt limited by aphasia  PERFORMANCE DEFICITS: in functional skills including ADLs, IADLs, coordination, dexterity, sensation, strength, mobility, body mechanics, decreased knowledge of use of DME, and UE functional use, cognitive skills including attention and safety awareness, and psychosocial skills including coping strategies.   IMPAIRMENTS: are limiting patient from ADLs, IADLs, leisure, and social participation.   CO-MORBIDITIES: may have co-morbidities  that affects occupational performance. Patient will benefit from skilled OT to address above impairments and improve overall function.  REHAB POTENTIAL: Good  PLAN:  OT FREQUENCY: 2x/week  OT DURATION: 8 weeks, plus evaluation (may only need 6 weeks)   PLANNED INTERVENTIONS: self care/ADL training, therapeutic exercise, therapeutic activity, neuromuscular re-education, balance training, functional mobility training, moist heat, patient/family education, cognitive remediation/compensation, visual/perceptual remediation/compensation, coping strategies training, and DME and/or AE instructions  RECOMMENDED OTHER SERVICES: none at this time  CONSULTED AND AGREED WITH PLAN OF CARE: Patient and family member/caregiver  PLAN FOR NEXT SESSION:  env scanning review; higher level cognition, progress towards remaining goals; review ADLs   Dennis Bast, OT 02/03/2023, 2:06 PM

## 2023-02-05 ENCOUNTER — Ambulatory Visit: Payer: Medicare Other | Admitting: Physical Therapy

## 2023-02-05 ENCOUNTER — Encounter: Payer: Self-pay | Admitting: Occupational Therapy

## 2023-02-05 ENCOUNTER — Encounter: Payer: Self-pay | Admitting: Physical Therapy

## 2023-02-05 ENCOUNTER — Encounter: Payer: Self-pay | Admitting: Speech Pathology

## 2023-02-05 ENCOUNTER — Ambulatory Visit: Payer: Medicare Other | Admitting: Speech Pathology

## 2023-02-05 ENCOUNTER — Ambulatory Visit: Payer: Medicare Other | Admitting: Occupational Therapy

## 2023-02-05 DIAGNOSIS — I69351 Hemiplegia and hemiparesis following cerebral infarction affecting right dominant side: Secondary | ICD-10-CM

## 2023-02-05 DIAGNOSIS — M6281 Muscle weakness (generalized): Secondary | ICD-10-CM

## 2023-02-05 DIAGNOSIS — R41842 Visuospatial deficit: Secondary | ICD-10-CM

## 2023-02-05 DIAGNOSIS — R2681 Unsteadiness on feet: Secondary | ICD-10-CM

## 2023-02-05 DIAGNOSIS — R4701 Aphasia: Secondary | ICD-10-CM

## 2023-02-05 DIAGNOSIS — R2689 Other abnormalities of gait and mobility: Secondary | ICD-10-CM

## 2023-02-05 NOTE — Patient Instructions (Addendum)
  On Talk Path App, practice sentence scramble (maybe level 3?) after you unscramble, Mark or Al say the sentence, then you say it with them  Complete the phrase under writing

## 2023-02-05 NOTE — Therapy (Signed)
OUTPATIENT PHYSICAL THERAPY NEURO TREATMENT NOTE-DISCHARGE SUMMARY   Patient Name: Kelsey Jacobs MRN: [id number] DOB:[birth year], 77 y.o., female Today's Date: 02/05/2023  PCP: Dr. Michell Heinrich [facility]) REFERRING PROVIDER: Levada Schilling, MD  PHYSICAL THERAPY DISCHARGE SUMMARY  Visits from Start of Care: 18  Current functional level related to goals / functional outcomes: See clinical impression statement.   Remaining deficits: Endurance w/ excessive distances   Education / Equipment: Discharge plan, walking safety w/ and w/o the cane, continue HEP, and progress with PT.  Patient agrees to discharge. Patient goals were met. Patient is being discharged due to meeting the stated rehab goals.   END OF SESSION:  PT End of Session - 02/05/23 1446     Visit Number 18    Number of Visits 18   17+1 for discharge   Date for PT Re-Evaluation 02/07/23   pushed out due to potential scheduling delay   Authorization Type MEDICARE PART A AND B    Progress Note Due on Visit 20    PT Start Time 1446   handoff from OT   PT Stop Time 1516    PT Time Calculation (min) 30 min    Equipment Utilized During Treatment Gait belt    Activity Tolerance Patient tolerated treatment well;Patient limited by fatigue    Behavior During Therapy WFL for tasks assessed/performed            Past Medical History:  Diagnosis Date   Medical history non-contributory    Past Surgical History:  Procedure Laterality Date   BACK SURGERY     FOOT SURGERY     INCISION AND DRAINAGE ABSCESS Left 03/29/2021   Procedure: INCISION AND DRAINAGE AXILLARY ABSCESS;  Surgeon: Jovita Kussmaul, MD;  Location: [facility];  Service: General;  Laterality: Left;   Patient Active Problem List   Diagnosis Date Noted   Axillary abscess 03/30/2021    ONSET DATE: 11/28/2022  REFERRING DIAG: R26.9 (ICD-10-CM) - Gait abnormality  THERAPY DIAG:  Muscle weakness (generalized)  Hemiplegia and  hemiparesis following cerebral infarction affecting right dominant side ([place])  Other abnormalities of gait and mobility  Unsteadiness on feet  Rationale for Evaluation and Treatment: Rehabilitation  SUBJECTIVE:                                                                                                                                                                                             SUBJECTIVE STATEMENT:   Pt indicates she is tired after standing activity in OT.  Denies falls since prior visit.    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 When asked about pain pt waves hand and shakes head "no"  PRECAUTIONS: Fall-anterior hip precautions have been lifted  WEIGHT BEARING RESTRICTIONS: No  FALLS: Has patient fallen in last 6 months? No  LIVING ENVIRONMENT: Lives with: lives with their spouse and lives with their son-son will be leaving eventually, but is there indefinitely  Lives in: House/apartment Stairs: Yes: Internal: 15 steps; on left going up and External: 2 steps; uses handheld assist and door frame Has following equipment at home: Quad cane small base, Walker - 2 wheeled, bed side commode, Grab bars, and transport chair, chair alarm, bed alarm, call button for son, insertable bed rails  PLOF: Independent with transfers, Needs assistance with ADLs, Needs assistance with homemaking, Needs assistance with gait, and typically has supervision with transfers  PATIENT GOALS: PT asks pt if she would like to get stronger to which she nods.  Son would like for mom to get back to her normal routine safely.  And to be able to leave her alone and know she is safe and for her to transition to the cane just to be a bit more independent.  OBJECTIVE:  -FOTO completed w/ PT reading questions to patient and husband:  62% -BERG:   [facility] PT Assessment - 02/05/23 1453       Berg Balance Test   Sit to Stand Able to stand without using hands and stabilize independently    Standing Unsupported Able to stand safely 2 minutes    Sitting with Back Unsupported but Feet Supported on Floor or Stool Able to sit safely and securely 2 minutes    Stand to Sit Sits safely with minimal use of hands    Transfers Able to transfer safely, minor use of hands    Standing Unsupported with Eyes Closed Able to stand 10 seconds safely    Standing Unsupported with Feet Together Able to place feet together independently and stand 1 minute safely    From Standing, Reach Forward with Outstretched Arm Can reach confidently >25 cm (10")    From Standing Position, Pick up Object from Floor Able to pick up shoe, needs supervision    From Standing Position, Turn to Look Behind Over each Shoulder Looks behind from both sides and weight shifts well    Turn 360 Degrees Able to turn 360 degrees safely but slowly    Standing Unsupported, Alternately Place Feet on Step/Stool Able to stand independently and safely and complete 8 steps in 20 seconds    Standing Unsupported, One Foot in Front Able to plae foot ahead of the other independently and hold 30 seconds    Standing on One Leg Tries to lift leg/unable to hold 3 seconds but remains standing independently    Total Score 49    Berg comment: 49/56 = moderate fall risk            -10MWT w/ rubber tip cane: 12.81 sec = 0.78 m/sec OR 2.58 ft/sec -10MWT w/o AD:  11.78 sec = 0.85 m/sec OR 2.80 ft/sec Pt requests to ambulate to eliminate LE heaviness following sitting discussion.  Ambulates x400' w/ intermittent use of cane.  Pt reports she is tired due to lack of sleep the previous night following task.  PATIENT EDUCATION: Education details: Discussion of progress, verbally reviewed HEP w/ reprint, provided PT business card for follow-up as needed.  Discussed continuation  of enjoyed activities, walking, and  HEP to supplement at home.  Person educated: Patient and Spouse Education method: Customer service manager Education comprehension: verbalized understanding, returned demonstration, and needs further education  HOME EXERCISE PROGRAM:  Access Code: [email or web address] URL: [email or web address] Date: 01/08/2023 Prepared by: Elease Etienne  Exercises - Seated Hamstring Stretch  - 1 x daily - 5 x weekly - 1 sets - 2-3 reps - 45 seconds hold - Seated Single Knee to Chest  - 1 x daily - 5 x weekly - 1 sets - 2-3 reps - 45 seconds hold - Standing Hip Flexor Stretch  - 1 x daily - 5 x weekly - 1 sets - 2-3 reps - 45 seconds hold - Standing Hip Extension with Leg Bent and Support  - 1 x daily - 5 x weekly - 1 sets - 10 reps - Forward Backward Monster Walk with Band at [employer] and Counter Support  - 1 x daily - 5 x weekly - 3 sets - 10 reps - Tandem Walking with Counter Support  - 1 x daily - 5 x weekly - 3 sets - 10 reps - Backward Walking with Counter Support  - 1 x daily - 5 x weekly - 3 sets - 10 reps  GOALS: Goals reviewed with patient? Yes  SHORT TERM GOALS: Target date: 01/03/2023  Pt will perform strength, stretching, and balance HEP with supervision from family to maintain progress. Baseline:  Revised based on current functional level 01/01/2023 Goal status: MET  2.  Increase Berg score to >/ 42/56 to demo improvement in balance. Baseline: 37/56 (1/2); 46/56 (1/24) Goal status: MET  3.  Pt will decrease 5xSTS to </=13 seconds w/ BUE support in order to demonstrate decreased risk for falls and improved functional bilateral LE strength and power. Baseline: 16.86 sec w/ BUE support; 13.47 sec w/ BUE support (01/01/2023) Goal status: IN PROGRESS  4.  Pt will demonstrate a gait speed of >/=2.5 feet/sec using LRAD in order to decrease risk for falls. Baseline: 2.08 ft/sec w/ RW; 2.57 ft/sec w/ RW (1/24) Goal status: MET  5.  Pt will negotiation 2 stairs w/o rail using LRAD  at no more than supervision level in order to improve safe accessibility to home environment. Baseline: Pt needs minA at home to access home via garage or front door; son reports they built a rail and pt is safest with this option at this time (01/02/2023) Goal status: REVISED-D/C'd  LONG TERM GOALS: Target date: 02/07/2023  Pt will increase FOTO score to 69% in order to demonstrate subjective functional improvement. Baseline: 54%; 62% Goal status: PARTIALLY MET  2.  BERG to be assessed w/ STG/LTG set as appropriate.  Increase Berg score to >/= 47/56 for reduced fall risk.  Baseline: 37/56 on 12-10-22; 49/56 (2/28) Goal status: MET  3.  Pt will decrease 5xSTS to </=13 seconds w/o BUE support in order to demonstrate decreased risk for falls and improved functional bilateral LE strength and power. Baseline: 15.45 sec without UE support (01/15/23); 16.29 sec w/o UE support; 14.28 sec w/o UE support (02/03/2023) Goal status: PARTIALLY MET  4.  Pt will demonstrate a gait speed of >/=2.98 feet/sec using LRAD in order to decrease risk for falls. Baseline: 2.08 ft/sec w/ RW; 2.43 ft/sec without AD (01/15/23); 2.80 ft/sec w/o AD (2/28) Goal status: PARTIALLY MET  5.  Pt will ambulate >/=500' over level indoor surfaces at modI level to improve endurance and safety with household mobility. Baseline: 125' w/ RW SBA;  870' without AD and SBA/CGA; 600' w/o AD independently (02/03/2023) Goal status: MET  ASSESSMENT:  CLINICAL IMPRESSION: Assessed remaining LTGs with patient meeting 2 of 5 and making good progress towards remaining 3 goals.  At last visit her 5xSTS time improved to 14.28 seconds without use of UE support and she was able to tolerate 600' of ambulation w/o use of an AD safely and independently.  Today her BERG score improved to 49/56 decreasing her fall risk into the moderate category.  FOTO survey was captured with patient and husband scoring her at 62%, an 8% improvement from initial evaluation.   She ambulates with good mechanics, but faster pace without the cane at 2.80 ft/sec vs when using the cane at 2.58 ft/sec so PT provided education to patient and husband on continuing to progress walking tolerance without the cane as able and using best judgement for RW vs cane at night for bathroom needs and long or unlevel distances during the day.  Overall, patient has made great progress with PT and was encouraged to continue working on activities to build cardiovascular endurance at home.  At this time, she is appropriate for and in agreement to discharge to home management from a PT perspective.  OBJECTIVE IMPAIRMENTS: Abnormal gait, decreased activity tolerance, decreased balance, decreased strength, increased muscle spasms, improper body mechanics, and postural dysfunction.   ACTIVITY LIMITATIONS: carrying, lifting, bending, squatting, stairs, transfers, bed mobility, and locomotion level  PARTICIPATION LIMITATIONS: meal prep, cleaning, laundry, medication management, driving, shopping, and community activity  PERSONAL FACTORS: Age, Fitness, Past/current experiences, Transportation, and 1-2 comorbidities: recent left THA and HTN  are also affecting patient's functional outcome.   REHAB POTENTIAL: Good  CLINICAL DECISION MAKING: Stable/uncomplicated  EVALUATION COMPLEXITY: Low  PLAN:  PT FREQUENCY: 2x/week   PT DURATION: 8 weeks + 1 week/1 visit to cover discharge visit  PLANNED INTERVENTIONS: Therapeutic exercises, Therapeutic activity, Neuromuscular re-education, Balance training, Gait training, Patient/Family education, Self Care, Joint mobilization, Stair training, Vestibular training, DME instructions, Manual therapy, and Re-evaluation  PLAN FOR NEXT SESSION:  N/A   Bary Richard, PT, DPT 02/05/2023, 4:48 PM

## 2023-02-05 NOTE — Therapy (Signed)
OUTPATIENT SPEECH LANGUAGE PATHOLOGY TREATMENT   Patient Name: Kelsey Jacobs MRN: [id number] DOB:[birth year], 77 y.o., female 32 Date: 02/05/2023  PCP: Larene Beach MD REFERRING PROVIDER: Gwendolyn Lima, FNP  END OF SESSION:  End of Session - 02/05/23 1333     Visit Number 19    Number of Visits 25    Date for SLP Re-Evaluation 02/19/23    Authorization Type medicare             Past Medical History:  Diagnosis Date   Medical history non-contributory    Past Surgical History:  Procedure Laterality Date   BACK SURGERY     FOOT SURGERY     INCISION AND DRAINAGE ABSCESS Left 03/29/2021   Procedure: INCISION AND DRAINAGE AXILLARY ABSCESS;  Surgeon: Jovita Kussmaul, MD;  Location: [facility];  Service: General;  Laterality: Left;   Patient Active Problem List   Diagnosis Date Noted   Axillary abscess 03/30/2021    ONSET DATE: 11/06/22   REFERRING DIAG: [id number] (ICD-10-CM) - Arterial ischemic stroke, MCA (middle cerebral artery), left, acute ([place]) Z74.09 (ICD-10-CM) - Other reduced mobility Z78.9 (ICD-10-CM) - Other specified health status I69.320 (ICD-10-CM) - Aphasia following cerebral infarction  THERAPY DIAG:  Aphasia  Rationale for Evaluation and Treatment: Rehabilitation  SUBJECTIVE:   SUBJECTIVE STATEMENT: "Kelsey Jacobs" PAIN:  Are you having pain? No  OBJECTIVE:   TODAY'S TREATMENT:    02-05-23: Used Talk Path to demonstrate 3 activities for home Jacobs and to target reading comprehension and verbal apraxia. 4 word sentence unscramble with usual min A to correct errors, complete the phrase with usual min A - Kelsey Jacobs repeated each sentence and phrase 3x with choral speech faded to mouthing 65% for phrases and 40% with sentences. Reading comprehension at 3-5 sentence paragraph required max A, however phrases and short sentences comprehended with rare min A. Provided homework reading and sequencing social and  household tasks. 02-03-23: Son added conversational phrases to SGD for home Jacobs - I added to these 3-5 word phrases (I'll have water, how was work, how was your day, how is your family, pass the bread) to target verbal apraxia in  conversational and personally relevant phrases. With fading choral and placement cues, Kelsey Jacobs approximated phrases 2-3x each. Written expression at phrase level targeted writing conversational phrases with usual mod written cues (phrase shown to her then removed prior to her writing) She wrote 10 phrases with 80% accuracy, she ID's error in the phrase however requires max A copy cues to self correct.   01-29-23: Targeted verbal apraxia reading conversational phrases (3-4 words) with choral, repetition and placement cues - Kelsey Jacobs approximated 16/27 phrases with max A. Targeted verbal apraxia using SGD travel icons and carrier phrase "I went to..." and "I go to[place] with choral and placement cues, Kelsey Jacobs approximated 7/9 sentences.  Written expression targeted writing 2-3 word conversational phrases with consistent extended time and frequent copy cues. Kelsey Jacobs exhibits frustration, I explained that she had only been writing 1 word nouns, and this is progressing to verbs and descriptive words.   01-27-23: Pt accompanied by spouse and son. Son reported that pt has been occasionally writing on SGD if she is struggling a lot to communicate with spoken word. SLP encouraged pt to use writing more frequently to supplement communication. Re-educated pt and family on purpose of SGD -- not only to Jacobs speech, but also to support communication. SLP facilitated conversation on topic of pt's past  travel experiences. Pt demonstrated ability to navigate device to locate appropriate icons and utilize whiteboard app for written communication. Pt id'd writing errors with rare min A and self corrected to closer target approximation with usual mod A. Despite spelling errors, clinician and fmaily able to  understand pt's written communicative attempts with approximately 85% accuracy. Of note: pt was writing names of locations, which included difficulty-to-spell words.  HEP: Add more travel terminology to SGD.   01-22-23: Targeting word finding, written expression and multimodal communication generating grocery/ingredient list for 2 dishes she made, chicken Piccata and meatballs - Elisabeth wrote 7 ingredients for piccata with usual min to mod fill in the blank and written cues. She Id'd errors with rare min A, consistent mod A to correct. She wrote 6 ingredients/grocery list for meatballs with usual mod A to correct errors. Kelsey Jacobs correctly wrote 6/13 ingredients with no errors, others she approximated the word enough for me and spouse to comprehend. She used SGD to indicate that she wanted to talk about travel, locating travel folder. Instructed son (pt instructions) to customize travel folder on her home page for access to this relevant topic. Using SGD, written expression, SLP questioning and written cues, Kelsey Jacobs took 4 turns in conversation re: shopping and brands to ID and indicate clothing and handbag brand preferences.   01-20-23: Pt received her Touch Talk and brought it in today. She used SGD to tell me where she ate with her friend and we added another restaurant she frequents. Using SGD she Id'd 3 friends she has seen the past week.  Kelsey Jacobs used Teacher, English as a foreign language (writing, SGD and speech at word level) to ask about extending her visits with rare min A to use writing. Targeted word finding and verbal expression generating list of 12 items in personally relevant category (make up) with consistent gestures, written cues for auditory comprehension. She repeated each item with placement cues 3x, 9/12 words. Kelsey Jacobs reports successful use of SGD and written communication at home with family support to augment verbal expression. She wrote (approximated) 3 brands of personal care items and Kelsey Jacobs Id'd them quickly. Kelsey Jacobs  continues to benefit from encouragement to expand her communicative settings. Instructed Kelsey Jacobs (see patient instructions) to Jacobs with her doing internet searches to her favorite stores and texting.   01-17-23: Pt accompanied by spouse. SLP provided assist to backup SGD. Spouse reported that the pt used to be an avid reader. SLP linked audio book website to [place] and demonstrated how to navigate the [place] app to [name]. SLP discussed benefits of app because it allows for changing font size to support reading difficulty.  Pt reported that she has been feeling more anxious lately. She experienced overstimulation this session and yesterday, per husband.  Provided external visual support (white board) and consistent visual cues and models, pt produced targeted one-, two-, and three- syllable combinations with ~35% accuracy. SLP provided model to demonstrate hand tapping out syllables when speaking. Utilized apraxia strategies to improve accurate productions (slow rate, divide into syllables, provide models and visual aids).  Pt upset during session d/t frustration with speech. She endorsed that her communication difficulty is "making me crazy". SLP supported pt with counseling and encouraged her to speak to her doctor about her concerns. Also discussed creating a journal for pt so that her family can help her track her progress/recovery.    PATIENT EDUCATION: Education details: Sees today's treatment and patient instructions, aphasia ed Person educated: Patient, Spouse, and Child(ren) Education method: Explanation, Demonstration, Verbal  cues, and Handouts Education comprehension: verbalized understanding, verbal cues required, and needs further education   GOALS: Goals reviewed with patient? Yes  SHORT TERM GOALS: Target date: 12/25/22  Pt will name 5 family members with occasional mod A Baseline: Goal status: MET  2.  Pt will answer personally relevant yes/no questions accurately 85% with  multimodal communication Baseline:  Goal status: MET  3.  Pt will ID 5 food and shopping preferences using multimodal communication with occasional min A Baseline:  Goal status: MET  4.  Pt will name 6 items in personally relevant categories using AAC/multimodal communication as needed with occasional mod A Baseline:  Goal status: NOT MET  5.  Family/caregivers will carryover 3 communication strategies to support pt's comprehension and expression Baseline:  Goal status: MET   LONG TERM GOALS: Target date: 02/18/22  Pt will initiate use mulitmodal communication to communicate wants/needs 3x over 1 week with usual min A from caregivers Baseline:  Goal status: IN PROGRESS  2.  Pt or caregivers will customize 4 items on AAC system with rare min A  Baseline:  Goal status: MET  3.  Pt will use multimodal communication to communicate 3 grocery items and 3 personal care items she needs with occasional min A from family Baseline:  Goal status: IN PROGRESS  4.  Pt will access online shopping site she uses and purchase a needed item with occasional  min A from family Baseline:  Goal status: IN PROGRESS  5.  Using compensatory strategies and multimodal communication, pt will take 3turns in conversation with occasional min A Baseline:  Goal status: IN PROGRESS  ASSESSMENT:  CLINICAL IMPRESSION: Patient is a 77 y.o. female who was seen today for aphasia. 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 Pt demonstrating improvement with spontaneous verbal productions. Responsive to verbal models. Imitation at simple phrase level with mod A and  continues to improve. Written expression also improving to more consistent use at home to augment attempted verbalizations. Continue to recommend utilization of SGD, as family reports success in communication with device. Improved  written expression at the word level, with min to mod Tenna is writing to augment communication as well as use SGD Continue to recommend skilled ST services.   OBJECTIVE IMPAIRMENTS: include attention, memory, aphasia, and apraxia. These impairments are limiting patient from return to work, managing medications, managing appointments, managing finances, household responsibilities, ADLs/IADLs, and effectively communicating at home and in community. Factors affecting potential to achieve goals and functional outcome are severity of impairments. Patient will benefit from skilled SLP services to address above impairments and improve overall function.  REHAB POTENTIAL: Good  PLAN:  SLP FREQUENCY: 2x/week  SLP DURATION: 12 weeks  PLANNED INTERVENTIONS: Language facilitation, Environmental controls, Cueing hierachy, Cognitive reorganization, Internal/external aids, Functional tasks, Multimodal communication approach, SLP instruction and feedback, Compensatory strategies, and Patient/family education   Alice Rieger Imaya Saunders, CCC-SLP 02/05/2023, 3:16 PM

## 2023-02-05 NOTE — Therapy (Signed)
OUTPATIENT OCCUPATIONAL THERAPY NEURO TREATMENT  Patient Name: Kelsey Jacobs MRN: [id number] DOB:1946/08/30, 77 y.o., female Today's Date: 02/05/2023  PCP: Dr. Vanessa Barbara  REFERRING PROVIDER: Levada Schilling, MD  END OF SESSION:  OT End of Session - 02/05/23 1407     Visit Number 14    Number of Visits 17    Date for OT Re-Evaluation 02/16/23    Authorization Type MCR, [employer]HC    Progress Note Due on Visit 10    OT Start Time 1407    OT Stop Time 1445    OT Time Calculation (min) 38 min    Activity Tolerance Patient tolerated treatment well    Behavior During Therapy WFL for tasks assessed/performed             Past Medical History:  Diagnosis Date   Medical history non-contributory    Past Surgical History:  Procedure Laterality Date   BACK SURGERY     FOOT SURGERY     INCISION AND DRAINAGE ABSCESS Left 03/29/2021   Procedure: INCISION AND DRAINAGE AXILLARY ABSCESS;  Surgeon: Jovita Kussmaul, MD;  Location: [facility];  Service: General;  Laterality: Left;   Patient Active Problem List   Diagnosis Date Noted   Axillary abscess 03/30/2021    ONSET DATE: 12/04/2022 referral date  REFERRING DIAG:  Diagnosis  I63.512 (ICD-10-CM) - Cerebral infarction due to unspecified occlusion or stenosis of left middle cerebral artery  Z74.09 (ICD-10-CM) - Other reduced mobility  Z78.9 (ICD-10-CM) - Other specified health status  I69.320 (ICD-10-CM) - Aphasia following cerebral infarction   THERAPY DIAG:  Muscle weakness (generalized)  Hemiplegia and hemiparesis following cerebral infarction affecting right dominant side ([place])  Visuospatial deficit   Rationale for Evaluation and Treatment: Rehabilitation  SUBJECTIVE:   SUBJECTIVE STATEMENT: She stating she was done with activity today.   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  4/10 RLE  FALLS: Has patient fallen in last 6 months? No  LIVING ENVIRONMENT: Lives with: lives with their spouse and lives with their son-son will be leaving eventually, but is there indefinitely  Lives in: House/apartment (pt lives on first floor)  Stairs: Yes: Internal: 15 steps; on left going up and External: 2 steps; uses handheld assist and door frame Has following equipment at home: Quad cane small base, Walker - 2 wheeled, bed side commode, Grab bars, and transport chair, chair alarm, bed alarm, call button for son, insertable bed rails   PLOF: Independent with transfers, Needs assistance with ADLs, Needs assistance with homemaking, Needs assistance with gait, and typically has supervision with transfers   PATIENT GOALS: PT asks pt if she would like to get stronger to which she nods.  Son would like for mom to get back to her normal routine safely.  And to be able to leave her alone and know she is safe and for her to transition to the cane just to be a bit more independent.    OBJECTIVE: (from evaluation unless otherwise noted)  HAND DOMINANCE: Right  ADLs: Overall ADLs: mostly supervision/cueing, min assist at times Transfers/ambulation related to ADLs: Eating: mod I  Grooming: mod I  UB Dressing: mod I  LB Dressing: min assist to don Lt foot in pants and occasionally Lt sock, slip on shoes Toileting: mod I (occasional  assist for perineal care w/ BM)  Bathing: set up, cues for thoroughness Tub Shower transfers: supervision/cues Equipment:  BSC for shower seat, grab bars  IADLs: Shopping: husband has done for several years Light housekeeping: dependent, has cleaning lady for heavier cleaning 1x/month Meal Prep: dependent at this time Community mobility: not currently driving - relying on family Medication management: husband administering Financial management: son has been  doing Handwriting: 100% legible and print, name only  MOBILITY STATUS:  uses walker   UPPER EXTREMITY ROM:  BUE AROM WNLs   UPPER EXTREMITY MMT:   RUE grossly 4/5, LUE MMT grossly 5/5   HAND FUNCTION: Grip strength: Right: 38.1 lbs; Left: 33.0 lbs  COORDINATION: 9 Hole Peg test: Right: 28 sec; Left: 26 sec  SENSATION: Slightly dulled RUE, pt appears to detect stimuli and localization  EDEMA: none  COGNITION: Overall cognitive status: Difficulty to assess due to: Communication impairment (aphasia). Pt can follow 1 step visual cues/demo  VISION: Subjective report: bluriness since stroke Baseline vision: Wears glasses all the time Visual history: corrective eye surgery  VISION ASSESSMENT: Not tested D/t aphasia  PERCEPTION: Not tested  PRAXIS: Not tested  OBSERVATIONS: Pt mostly limited by Mervin Hack, overall needs sup/min assist for BADLS  TODAY'S TREATMENT:                                                                                                                              - Neuro re-education completed for duration as noted below including: Pt completed arm bike in sitting for 5 minutes with average RPM of 25 at level 4 for endurance, ROM, and strengthening of affected extremity. Pt alternating direction of pedaling halfway through. Intermittent cues provided to maintain stability with respect to anterior/posterior trunk lean and consistent grasp maintenance. Semi-circular pegboard completion with use of RUE for improved ROM, processing, coordination, and scanning.  Pt participated in Memory game (40 pieces) requiring R coordination, scanning, ROM, and verbalization of animal after finding a match. Increased time and cueing required for completion.     PATIENT EDUCATION: Education details:RUE and cognitive rehab Person educated: Patient and Spouse Education method: Explanation, Demonstration, Tactile cues, Verbal cues, and Handouts Education comprehension:  verbalized understanding, returned demonstration, verbal cues required, tactile cues required, and needs further education  HOME EXERCISE PROGRAM: 1/12 - dowel HEP 1/31 - RUE yellow theraband   GOALS: Goals reviewed with patient? Yes  SHORT TERM GOALS: Target date: 01/18/23  Independent with HEP for RUE strengthening Baseline: Goal status: MET  2.  Pt to perform LE dressing (donning pants and Lt sock) w/ AE and no more than min cues Baseline:  Goal status: MET  3.  Pt to prepare simple snack, sandwich, or microwaveable items with min cues and direct sup Baseline:  Goal status: IN PROGRESS  4.  Pt to perform bathing and perineal care with no more than min cues/sup Baseline:  Goal status: MET   LONG TERM GOALS: Target date:  02/16/23  Independent with all BADLS Baseline:  [id number]: Supervision to intermittent min A Goal status: IN PROGRESS  2.  Pt to perform familiar stovetop meal with direct supervision and min cues prn Baseline:  2/14: planning to cook stovetop pasta meal this weekend Goal status: IN PROGRESS  3.  Pt to consistently perform light IADLS including washing dishes and laundry Baseline:  01/20/2023 - has been doing laundry and putting away dishes (uses dishwasher) Goal status: IN PROGRESS  ASSESSMENT:  CLINICAL IMPRESSION: Pt is continuing to make physical gains with improve balance and participation in ADLS. Pt limited by aphasia  PERFORMANCE DEFICITS: in functional skills including ADLs, IADLs, coordination, dexterity, sensation, strength, mobility, body mechanics, decreased knowledge of use of DME, and UE functional use, cognitive skills including attention and safety awareness, and psychosocial skills including coping strategies.   IMPAIRMENTS: are limiting patient from ADLs, IADLs, leisure, and social participation.   CO-MORBIDITIES: may have co-morbidities  that affects occupational performance. Patient will benefit from skilled OT to address above  impairments and improve overall function.  REHAB POTENTIAL: Good  PLAN:  OT FREQUENCY: 2x/week  OT DURATION: 8 weeks, plus evaluation (may only need 6 weeks)   PLANNED INTERVENTIONS: self care/ADL training, therapeutic exercise, therapeutic activity, neuromuscular re-education, balance training, functional mobility training, moist heat, patient/family education, cognitive remediation/compensation, visual/perceptual remediation/compensation, coping strategies training, and DME and/or AE instructions  RECOMMENDED OTHER SERVICES: none at this time  CONSULTED AND AGREED WITH PLAN OF CARE: Patient and family member/caregiver  PLAN FOR NEXT SESSION:  higher level cognition, progress towards remaining goals; review ADLs   Dennis Bast, OT 02/05/2023, 3:27 PM

## 2023-02-11 ENCOUNTER — Ambulatory Visit: Payer: Medicare Other | Admitting: Occupational Therapy

## 2023-02-14 ENCOUNTER — Encounter: Payer: Self-pay | Admitting: Occupational Therapy

## 2023-02-14 ENCOUNTER — Ambulatory Visit: Payer: Medicare Other | Admitting: Speech Pathology

## 2023-02-14 ENCOUNTER — Ambulatory Visit: Payer: Medicare Other | Attending: Nurse Practitioner | Admitting: Occupational Therapy

## 2023-02-14 DIAGNOSIS — R4701 Aphasia: Secondary | ICD-10-CM | POA: Diagnosis present

## 2023-02-14 DIAGNOSIS — I69351 Hemiplegia and hemiparesis following cerebral infarction affecting right dominant side: Secondary | ICD-10-CM | POA: Diagnosis present

## 2023-02-14 DIAGNOSIS — R482 Apraxia: Secondary | ICD-10-CM | POA: Diagnosis present

## 2023-02-14 DIAGNOSIS — R41842 Visuospatial deficit: Secondary | ICD-10-CM | POA: Diagnosis present

## 2023-02-14 DIAGNOSIS — M6281 Muscle weakness (generalized): Secondary | ICD-10-CM | POA: Insufficient documentation

## 2023-02-14 DIAGNOSIS — R41840 Attention and concentration deficit: Secondary | ICD-10-CM | POA: Insufficient documentation

## 2023-02-14 NOTE — Therapy (Signed)
OUTPATIENT OCCUPATIONAL THERAPY NEURO TREATMENT  Patient Name: Kelsey Jacobs MRN: [id number] DOB:1946-05-21, 77 y.o., female Today's Date: 02/14/2023  OCCUPATIONAL THERAPY DISCHARGE SUMMARY  Visits from Start of Care: 15  Current functional level related to goals / functional outcomes: Patient has met 4/4 short-term goals and 3/3 long-term goals to date.   Remaining deficits: Pt remains limited by aphasia   Education / Equipment: Continue with RUE HEP following OT d/c including writing. Encouraged reading beginner level books.    Patient agrees to discharge. Patient goals were met. Patient is being discharged due to meeting the stated rehab goals.Marland Kitchen     PCP: Dr. Vanessa Barbara  REFERRING PROVIDER: Levada Schilling, MD  END OF SESSION:  OT End of Session - 02/14/23 1156     Visit Number 15    Number of Visits 17    Date for OT Re-Evaluation 02/16/23    Authorization Type MCR, [employer]    Progress Note Due on Visit 10    OT Start Time 1149    OT Stop Time 1227    OT Time Calculation (min) 38 min    Activity Tolerance Patient tolerated treatment well    Behavior During Therapy WFL for tasks assessed/performed             Past Medical History:  Diagnosis Date   Medical history non-contributory    Past Surgical History:  Procedure Laterality Date   BACK SURGERY     FOOT SURGERY     INCISION AND DRAINAGE ABSCESS Left 03/29/2021   Procedure: INCISION AND DRAINAGE AXILLARY ABSCESS;  Surgeon: Autumn Messing III, MD;  Location: [facility];  Service: General;  Laterality: Left;   Patient Active Problem List   Diagnosis Date Noted   Axillary abscess 03/30/2021    ONSET DATE: 12/04/2022 referral date  REFERRING DIAG:  Diagnosis  I63.512 (ICD-10-CM) - Cerebral infarction due to unspecified occlusion or stenosis of left middle cerebral artery  Z74.09 (ICD-10-CM) - Other reduced mobility  Z78.9 (ICD-10-CM) - Other specified health status  I69.320 (ICD-10-CM) - Aphasia following  cerebral infarction   THERAPY DIAG:  Muscle weakness (generalized)  Hemiplegia and hemiparesis following cerebral infarction affecting right dominant side (HCC)  Visuospatial deficit  Aphasia  Attention and concentration deficit   Rationale for Evaluation and Treatment: Rehabilitation  SUBJECTIVE:   SUBJECTIVE STATEMENT: She stating she knows she has made progress and is appreciative of therapy services.   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  4/10 RLE  FALLS: Has patient fallen in last 6 months? No  LIVING ENVIRONMENT: Lives with: lives with their spouse and lives with their son-son will be leaving eventually, but is there indefinitely  Lives in: House/apartment (pt lives on first floor)  Stairs: Yes: Internal: 15 steps; on left going up and External: 2 steps; uses handheld assist and door frame Has following equipment at home: Quad cane small base, Walker - 2 wheeled, bed side commode, Grab bars, and transport chair, chair alarm, bed alarm, call button for son, insertable bed rails   PLOF: Independent with transfers, Needs assistance with ADLs, Needs assistance with homemaking, Needs assistance with gait, and typically has supervision with transfers   PATIENT GOALS: PT asks pt if she would like to get stronger to which she nods.  Son would like  for mom to get back to her normal routine safely.  And to be able to leave her alone and know she is safe and for her to transition to the cane just to be a bit more independent.    OBJECTIVE: (from evaluation unless otherwise noted)  HAND DOMINANCE: Right  ADLs: Overall ADLs: mostly supervision/cueing, min assist at times Transfers/ambulation related to ADLs: Eating: mod I  Grooming: mod I  UB Dressing:  mod I  LB Dressing: min assist to don Lt foot in pants and occasionally Lt sock, slip on shoes Toileting: mod I (occasional assist for perineal care w/ BM)  Bathing: set up, cues for thoroughness Tub Shower transfers: supervision/cues Equipment:  BSC for shower seat, grab bars  02/14/2023 - see goals section  IADLs: Shopping: husband has done for several years Light housekeeping: dependent, has cleaning lady for heavier cleaning 1x/month Meal Prep: dependent at this time Community mobility: not currently driving - relying on family Medication management: husband administering Financial management: son has been doing Handwriting: 100% legible and print, name only  02/14/2023 - see goals section  MOBILITY STATUS:  uses walker 02/14/2023 - independent   UPPER EXTREMITY ROM:  BUE AROM WNLs   UPPER EXTREMITY MMT:   RUE grossly 4/5, LUE MMT grossly 5/5 02/14/2023 - WNL   HAND FUNCTION: Grip strength: Right: 38.1 lbs; Left: 33.0 lbs 02/14/2023: no change  COORDINATION: 9 Hole Peg test: Right: 28 sec; Left: 26 sec 02/14/2023 - R 25 seconds; L 24 seconds  SENSATION: Slightly dulled RUE, pt appears to detect stimuli and localization 02/14/2023 - no change  EDEMA: none  COGNITION: Overall cognitive status: Difficulty to assess due to: Communication impairment (aphasia). Pt can follow 1 step visual cues/demo 02/14/2023 - can follow at least 2 step demo/cues  VISION: Subjective report: bluriness since stroke Baseline vision: Wears glasses all the time Visual history: corrective eye surgery 02/14/2023 - no change  VISION ASSESSMENT: Not tested D/t aphasia  PERCEPTION: Not tested  PRAXIS: Not tested  OBSERVATIONS: Pt mostly limited by Mervin Hack, overall needs sup/min assist for BADLS  TODAY'S TREATMENT:                                                                                                                              - Self-care/home management completed for duration as noted  below including: Objective measures assessed as noted in Objective and Goals sections to determine progression towards goals. Therapist reviewed goals with patient and updated patient progression.  No additional functional limitations identified.  OT initiated short sentence handwriting HEP.   PATIENT EDUCATION: Education details:OT d/c Person educated: Patient and Spouse Education method: Explanation, Demonstration, Corporate treasurer cues, Verbal cues, and Handouts Education comprehension: verbalized understanding, returned demonstration, verbal cues required, tactile cues required, and needs further education  HOME EXERCISE PROGRAM: 1/12 - dowel HEP 1/31 - RUE yellow theraband   GOALS: Goals reviewed with patient? Yes  SHORT TERM GOALS: Target date: 01/18/23  Independent with HEP for RUE strengthening Baseline: Goal status: MET  2.  Pt to perform LE dressing (donning pants and Lt sock) w/ AE and no more than min cues Baseline:  Goal status: MET  3.  Pt to prepare simple snack, sandwich, or microwaveable items with min cues and direct sup Baseline:  Goal status: MET  4.  Pt to perform bathing and perineal care with no more than min cues/sup Baseline:  Goal status: MET   LONG TERM GOALS: Target date: 02/16/23  Independent with all BADLS Baseline:  [id number]: Supervision to intermittent min A Goal status: MET  2.  Pt to perform familiar stovetop meal with direct supervision and min cues prn Baseline:  2/14: planning to cook stovetop pasta meal this weekend 3/8: made linguini and clam sauce Goal status: MET  3.  Pt to consistently perform light IADLS including washing dishes and laundry Baseline:  01/20/2023 - has been doing laundry and putting away dishes (uses dishwasher) Goal status: MET  ASSESSMENT:  CLINICAL IMPRESSION: This pt has met all goals with significant improvement to functional progression. Patient is appropriate for discharge and no longer demonstrates medical  necessity for continued skilled occupational services.  PERFORMANCE DEFICITS: in functional skills including ADLs, IADLs, coordination, dexterity, sensation, strength, mobility, body mechanics, decreased knowledge of use of DME, and UE functional use, cognitive skills including attention and safety awareness, and psychosocial skills including coping strategies.   IMPAIRMENTS: are limiting patient from ADLs, IADLs, leisure, and social participation.   CO-MORBIDITIES: may have co-morbidities  that affects occupational performance. Patient will benefit from skilled OT to address above impairments and improve overall function.  REHAB POTENTIAL: Good  PLAN:  OT D/C completed  Dennis Bast, OT 02/14/2023, 2:46 PM

## 2023-02-14 NOTE — Therapy (Addendum)
OUTPATIENT SPEECH LANGUAGE PATHOLOGY TREATMENT   Patient Name: Kelsey Jacobs MRN: [id number] DOB:September 29, 1946, 77 y.o., female 57 Date: 02/14/2023  PCP: Larene Beach MD REFERRING PROVIDER: Gwendolyn Lima, FNP  END OF SESSION:  End of Session - 02/14/23 1120     Visit Number 20    Number of Visits 25    Date for SLP Re-Evaluation 02/19/23    Authorization Type medicare    SLP Start Time 1109   Previous session ran over   SLP Stop Time  1147    SLP Time Calculation (min) 38 min    Activity Tolerance Patient tolerated treatment well              Past Medical History:  Diagnosis Date   Medical history non-contributory    Past Surgical History:  Procedure Laterality Date   BACK SURGERY     FOOT SURGERY     INCISION AND DRAINAGE ABSCESS Left 03/29/2021   Procedure: INCISION AND DRAINAGE AXILLARY ABSCESS;  Surgeon: Jovita Kussmaul, MD;  Location: [facility];  Service: General;  Laterality: Left;   Patient Active Problem List   Diagnosis Date Noted   Axillary abscess 03/30/2021    ONSET DATE: 11/06/22   REFERRING DIAG: [id number] (ICD-10-CM) - Arterial ischemic stroke, MCA (middle cerebral artery), left, acute ([place]) Z74.09 (ICD-10-CM) - Other reduced mobility Z78.9 (ICD-10-CM) - Other specified health status I69.320 (ICD-10-CM) - Aphasia following cerebral infarction  THERAPY DIAG:  Aphasia  Verbal apraxia  Rationale for Evaluation and Treatment: Rehabilitation  SUBJECTIVE:   SUBJECTIVE STATEMENT: "I'm good" Pt reports that she has been using her SGD to communicate and practice her speech and language. Husband endorses "she has been using it for everything. She's on it all the time." PAIN:  Are you having pain? No  OBJECTIVE:   TODAY'S TREATMENT:   02-14-23: Pt has been using [employer] to practice speech at home. Clinician provided visual aid to break target phrases into words. When provided model, pt demonstrated 100% accuracy with target phrase "how  are you doing". Benefits from choral production and visual aid to support accuracy. Re-educated pt on importance of repetitive practice to establish new motor speech patterns and increase speech accuracy. Demonstrated how to incorporate into home practice and how pt's spouse can provide faded cues  Pt and husband reported that they have been singing at home. SLP employed Engineer, agricultural Therapy ([employer]) as apraxia strategy. Combined with faded model, choral reading, and tapping hand, pt's accuracy improved at phrase level.  Pt endorses prior love of reading, has been unable to do since stroke. SLP educated pt on using Libby on her SGD to listen to [place]. Introduced pt to compensations she could use to improve her comprehension, such as reducing audio speed and pausing to take breaks. Pt endorsed that she found the app helpful. SLP provided A to find appropriate settings to support pt's auditory comprehension.   02-05-23: Used Talk Path to demonstrate 3 activities for home practice and to target reading comprehension and verbal apraxia. 4 word sentence unscramble with usual min A to correct errors, complete the phrase with usual min A - Aleene repeated each sentence and phrase 3x with choral speech faded to mouthing 65% for phrases and 40% with sentences. Reading comprehension at 3-5 sentence paragraph required max A, however phrases and short sentences comprehended with rare min A. Provided homework reading and sequencing social and household tasks.  02-03-23: Son added conversational phrases to SGD for home practice - I  added to these 3-5 word phrases (I'll have water, how was work, how was your day, how is your family, pass the bread) to target verbal apraxia in  conversational and personally relevant phrases. With fading choral and placement cues, Miu approximated phrases 2-3x each. Written expression at phrase level targeted writing conversational phrases with usual mod written cues (phrase shown to her  then removed prior to her writing) She wrote 10 phrases with 80% accuracy, she ID's error in the phrase however requires max A copy cues to self correct.   01-29-23: Targeted verbal apraxia reading conversational phrases (3-4 words) with choral, repetition and placement cues - Srija approximated 16/27 phrases with max A. Targeted verbal apraxia using SGD travel icons and carrier phrase "I went to..." and "I go to.[place].." with choral and placement cues, Krysia approximated 7/9 sentences.  Written expression targeted writing 2-3 word conversational phrases with consistent extended time and frequent copy cues. Elmo exhibits frustration, I explained that she had only been writing 1 word nouns, and this is progressing to verbs and descriptive words.   01-27-23: Pt accompanied by spouse and son. Son reported that pt has been occasionally writing on SGD if she is struggling a lot to communicate with spoken word. SLP encouraged pt to use writing more frequently to supplement communication. Re-educated pt and family on purpose of SGD -- not only to practice speech, but also to support communication. SLP facilitated conversation on topic of pt's past travel experiences. Pt demonstrated ability to navigate device to locate appropriate icons and utilize whiteboard app for written communication. Pt id'd writing errors with rare min A and self corrected to closer target approximation with usual mod A. Despite spelling errors, clinician and fmaily able to understand pt's written communicative attempts with approximately 85% accuracy. Of note: pt was writing names of locations, which included difficulty-to-spell words.  HEP: Add more travel terminology to SGD.    PATIENT EDUCATION: Education details: Sees today's treatment and patient instructions, aphasia ed Person educated: Patient, Spouse, and Child(ren) Education method: Explanation, Demonstration, Verbal cues, and Handouts Education comprehension: verbalized understanding,  verbal cues required, and needs further education   GOALS: Goals reviewed with patient? Yes  SHORT TERM GOALS: Target date: 12/25/22  Pt will name 5 family members with occasional mod A Baseline: Goal status: MET  2.  Pt will answer personally relevant yes/no questions accurately 85% with multimodal communication Baseline:  Goal status: MET  3.  Pt will ID 5 food and shopping preferences using multimodal communication with occasional min A Baseline:  Goal status: MET  4.  Pt will name 6 items in personally relevant categories using AAC/multimodal communication as needed with occasional mod A Baseline:  Goal status: NOT MET  5.  Family/caregivers will carryover 3 communication strategies to support pt's comprehension and expression Baseline:  Goal status: MET   LONG TERM GOALS: Target date: 02/18/22  Pt will initiate use mulitmodal communication to communicate wants/needs 3x over 1 week with usual min A from caregivers Baseline:  Goal status: MET  2.  Pt or caregivers will customize 4 items on AAC system with rare min A  Baseline:  Goal status: MET  3.  Pt will use multimodal communication to communicate 3 grocery items and 3 personal care items she needs with occasional min A from family Baseline:  Goal status: IN PROGRESS  4.  Pt will access online shopping site she uses and purchase a needed item with occasional  min A from family Baseline:  Goal status: IN PROGRESS  5.  Using compensatory strategies and multimodal communication, pt will take 3turns in conversation with occasional min A Baseline:  Goal status: IN PROGRESS  ASSESSMENT:  CLINICAL IMPRESSION: Patient is a 77 y.o. female who was seen today for aphasia. 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 Pt demonstrating improvement with spontaneous verbal productions. Responsive to  verbal models. Imitation at simple phrase level with mod A and  continues to improve. Written expression also improving to more consistent use at home to augment attempted verbalizations. Continue to recommend utilization of SGD, as family reports success in communication with device. Improved written expression at the word level, with min to mod Tonyia is writing to augment communication as well as use SGD Continue to recommend skilled ST services.   OBJECTIVE IMPAIRMENTS: include attention, memory, aphasia, and apraxia. These impairments are limiting patient from return to work, managing medications, managing appointments, managing finances, household responsibilities, ADLs/IADLs, and effectively communicating at home and in community. Factors affecting potential to achieve goals and functional outcome are severity of impairments. Patient will benefit from skilled SLP services to address above impairments and improve overall function.  REHAB POTENTIAL: Good  PLAN:  SLP FREQUENCY: 2x/week  SLP DURATION: 12 weeks  PLANNED INTERVENTIONS: Language facilitation, Environmental controls, Cueing hierachy, Cognitive reorganization, Internal/external aids, Functional tasks, Multimodal communication approach, SLP instruction and feedback, Compensatory strategies, and Patient/family education  Speech Therapy Progress Note  Dates of Reporting Period: 01/06/23 to 02/14/2023  Objective Reports of Subjective Statement: Pt seen for x10 ST sessions for rehabilitation of expressive and receptive language and apraxia of speech s/p CVA. Pt and family members report perception of improvement, with patient able to increase utterance length to mult word level in structured practice. Use of speech generating device is facilitative for enhancing communication abilities and aiding in HEP completion.   Objective Measurements: Mild progress in accurate motor speech productions, with pt currently practicing at carrier phrase or  rote phrase level. Increased IND in navigating SGD and son is IND modifying device. Mod-I employment of supportive communication strategies from son and husband.   Goal Update: see able  Plan: continue per POC  Reason Skilled Services are Required: enhance communication efficacy, enhance QoL   Cephus Shelling, Student-SLP 02/14/2023, 11:48 AM

## 2023-02-14 NOTE — Patient Instructions (Signed)
Libby playback speed is .8

## 2023-02-17 ENCOUNTER — Ambulatory Visit: Payer: Medicare Other | Admitting: Speech Pathology

## 2023-02-17 ENCOUNTER — Encounter: Payer: Self-pay | Admitting: Speech Pathology

## 2023-02-17 DIAGNOSIS — R4701 Aphasia: Secondary | ICD-10-CM

## 2023-02-17 DIAGNOSIS — M6281 Muscle weakness (generalized): Secondary | ICD-10-CM | POA: Diagnosis not present

## 2023-02-17 DIAGNOSIS — R482 Apraxia: Secondary | ICD-10-CM

## 2023-02-17 NOTE — Therapy (Signed)
OUTPATIENT SPEECH LANGUAGE PATHOLOGY TREATMENT   Patient Name: Kelsey Jacobs MRN: [id number] DOB:12-20-1945, 77 y.o., female 77 Date: 02/17/2023  PCP: Larene Beach MD REFERRING PROVIDER: Gwendolyn Lima, FNP  END OF SESSION:  End of Session - 02/17/23 1107     Visit Number 21    Number of Visits 53   added 18 visits for re-cert   Date for SLP Re-Evaluation 04/14/23    Authorization Type medicare    SLP Start Time 1100    SLP Stop Time  1145    SLP Time Calculation (min) 45 min    Activity Tolerance Patient tolerated treatment well              Past Medical History:  Diagnosis Date   Medical history non-contributory    Past Surgical History:  Procedure Laterality Date   BACK SURGERY     FOOT SURGERY     INCISION AND DRAINAGE ABSCESS Left 03/29/2021   Procedure: INCISION AND DRAINAGE AXILLARY ABSCESS;  Surgeon: Jovita Kussmaul, MD;  Location: [facility];  Service: General;  Laterality: Left;   Patient Active Problem List   Diagnosis Date Noted   Axillary abscess 03/30/2021    ONSET DATE: 11/06/22   REFERRING DIAG: [id number] (ICD-10-CM) - Arterial ischemic stroke, MCA (middle cerebral artery), left, acute ([place]) Z74.09 (ICD-10-CM) - Other reduced mobility Z78.9 (ICD-10-CM) - Other specified health status I69.320 (ICD-10-CM) - Aphasia following cerebral infarction  THERAPY DIAG:  Aphasia - Plan: SLP plan of care cert/re-cert  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 No  OBJECTIVE:   TODAY'S TREATMENT:   02-17-23: Kelsey Jacobs enters with 2 accurate 4 word phrases about where she has been with extended time and self corrections. She continues to make progress. In conversation, Kelsey Jacobs utilized [employer], written words and gestures to augment speech to take 4 turns in a conversation, with rare min A. Targeted  verbal apraxia increasing length of utterance and introducing new personally relevant phrases (see Kelsey Jacobs instructions) for conversations with her sisters' families. With modeling, fading choral speech, Kelsey Jacobs approximated 7/10 phrases. Provided them for home practice. Added [employer] to her SGD as she dined there with her sisters. Kelsey Jacobs will look online for clothes as well as continue to practice conversational phrases at home  02-14-23: Kelsey Jacobs has been using [employer] to practice speech at home. Kelsey Jacobs provided visual aid to break target phrases into words. When provided model, Kelsey Jacobs demonstrated 100% accuracy with target phrase "how are you doing". Benefits from choral production and visual aid to support accuracy. Re-educated Kelsey Jacobs on importance of repetitive practice to establish new motor speech patterns and increase speech accuracy. Demonstrated how to incorporate into home practice and how Kelsey Jacobs's Kelsey Jacobs can provide faded cues  Kelsey Jacobs and Kelsey Jacobs reported that they have been singing at home. SLP employed Engineer, agricultural Therapy ([employer]) as apraxia strategy. Combined with faded model, choral reading, and tapping hand, Kelsey Jacobs's accuracy improved at phrase level.  Kelsey Jacobs endorses prior love of reading, has been unable to do since stroke. SLP educated Kelsey Jacobs on using Libby on her SGD to listen to [place]. Introduced Kelsey Jacobs to compensations she could use to improve her comprehension, such as reducing audio speed and pausing to take breaks. Kelsey Jacobs endorsed that she found the app helpful. SLP provided A to find appropriate settings to support Kelsey Jacobs's auditory comprehension.  02-05-23: Used [employer] to demonstrate 3 activities for home practice and to target reading comprehension and verbal apraxia. 4 word sentence unscramble with usual min A to correct errors, complete the phrase with usual min A - Kelsey Jacobs repeated each sentence and phrase 3x with choral speech faded to mouthing 65% for phrases and 40% with sentences. Reading comprehension at 3-5  sentence paragraph required max A, however phrases and short sentences comprehended with rare min A. Provided homework reading and sequencing social and household tasks.  02-03-23: Kelsey Jacobs added conversational phrases to SGD for home practice - I added to these 3-5 word phrases (I'll have water, how was work, how was your day, how is your Kelsey Jacobs, pass the bread) to target verbal apraxia in  conversational and personally relevant phrases. With fading choral and placement cues, Kelsey Jacobs approximated phrases 2-3x each. Written expression at phrase level targeted writing conversational phrases with usual mod written cues (phrase shown to her then removed prior to her writing) She wrote 10 phrases with 80% accuracy, she ID's error in the phrase however requires max A copy cues to self correct.   01-29-23: Targeted verbal apraxia reading conversational phrases (3-4 words) with choral, repetition and placement cues - Kelsey Jacobs approximated 16/27 phrases with max A. Targeted verbal apraxia using SGD travel icons and carrier phrase "I went to..." and "I go to[place] with choral and placement cues, Kelsey Jacobs approximated 7/9 sentences.  Written expression targeted writing 2-3 word conversational phrases with consistent extended time and frequent copy cues. Kelsey Jacobs exhibits frustration, I explained that she had only been writing 1 word nouns, and this is progressing to verbs and descriptive words.   01-27-23: Kelsey Jacobs accompanied by Kelsey Jacobs and Kelsey Jacobs. Kelsey Jacobs reported that Kelsey Jacobs has been occasionally writing on SGD if she is struggling a lot to communicate with spoken word. SLP encouraged Kelsey Jacobs to use writing more frequently to supplement communication. Re-educated Kelsey Jacobs and Kelsey Jacobs on purpose of SGD -- not only to practice speech, but also to support communication. SLP facilitated conversation on topic of Kelsey Jacobs's past travel experiences. Kelsey Jacobs demonstrated ability to navigate device to locate appropriate icons and utilize whiteboard app for written communication. Kelsey Jacobs id'd  writing errors with rare min A and self corrected to closer target approximation with usual mod A. Despite spelling errors, Kelsey Jacobs and fmaily able to understand Kelsey Jacobs's written communicative attempts with approximately 85% accuracy. Of note: Kelsey Jacobs was writing names of locations, which included difficulty-to-spell words.  HEP: Add more travel terminology to SGD.    PATIENT EDUCATION: Education details: Sees today's treatment and patient instructions, aphasia ed Person educated: Patient, Kelsey Jacobs, and Child(ren) Education method: Explanation, Demonstration, Verbal cues, and Handouts Education comprehension: verbalized understanding, verbal cues required, and needs further education   GOALS: Goals reviewed with patient? Yes  SHORT TERM GOALS: Target date: 12/25/22  Kelsey Jacobs will name 5 Kelsey Jacobs members with occasional mod A Baseline: Goal status: MET  2.  Kelsey Jacobs will answer personally relevant yes/no questions accurately 85% with multimodal communication Baseline:  Goal status: MET  3.  Kelsey Jacobs will ID 5 food and shopping preferences using multimodal communication with occasional min A Baseline:  Goal status: MET  4.  Kelsey Jacobs will name 6 items in personally relevant categories using AAC/multimodal communication as needed with occasional mod A Baseline:  Goal status: NOT MET  5.  Kelsey Jacobs/caregivers will carryover 3 communication strategies to support Kelsey Jacobs's comprehension and expression Baseline:  Goal status: MET   LONG TERM GOALS: Target date: 02/18/22  Kelsey Jacobs will initiate use mulitmodal  communication to communicate wants/needs 3x over 1 week with usual min A from caregivers Baseline:  Goal status: MET  2.  Kelsey Jacobs or caregivers will customize 4 items on AAC system with rare min A  Baseline:  Goal status: MET  3.  Kelsey Jacobs will use multimodal communication to communicate 3 grocery items and 3 personal care items she needs with occasional min A from Kelsey Jacobs Baseline:  Goal status: MET  4.  Kelsey Jacobs will access online  shopping site she uses and purchase a needed item with occasional  min A from Kelsey Jacobs Baseline: ongoing goal for re-cert [id number] Goal status: ONGOING   5.  Using compensatory strategies and multimodal communication, Kelsey Jacobs will take 3turns in conversation with occasional min A Baseline:  Goal status: MET  6. Kelsey Jacobs will produce 3-5 word conversational phrases with frequent mod A in structured task Baseline: new goal for re-cert [id number] Goal Status: New  7. Kelsey Jacobs will order 1-2 items dining out using multimodal communication over 3 restaurant meals with occasional min A from Kelsey Jacobs             Baseline: new goal for recert [id number]             Goal Status: New  8. Casha will use appropriate conversational phrases over phone calls with her Kelsey Jacobs 1-2x over 4 phone calls or face time with occasional min a from Kelsey Jacobs                 Baseline: new goal for re-cert 0000000                Goal Status: New       ASSESSMENT:  CLINICAL IMPRESSION: Blayklee has demonstrated good progress - she is using SGD with mod I, writing at word level to augment verbal communication. Severe verbal apraxia persists, however Money has progressed from producing 1 word utterances in structured tasks to producing 3 word utterances and social phrases with occasional min A. She continues to require max A to carryover these phrases into spontaneous conversation. Aphasia has improved from severe to moderate. She remains frustrated with her difficulty communicating.  At this time, short term and long term goals met - Myleena remains motivated and practices daily, carrying over all homework, using aphasia apps to practice. I recommend she Continue skilled ST services to maximize verbal and non verbal communication for QOL, to reduce caregiver burden and life participation.   OBJECTIVE IMPAIRMENTS: include attention, memory, aphasia, and apraxia. These impairments are limiting patient from return to work, managing medications, managing appointments,  managing finances, household responsibilities, ADLs/IADLs, and effectively communicating at home and in community. Factors affecting potential to achieve goals and functional outcome are severity of impairments. Patient will benefit from skilled SLP services to address above impairments and improve overall function.  REHAB POTENTIAL: Good  PLAN:  SLP FREQUENCY: 2x/week  SLP DURATION: 12 weeks  PLANNED INTERVENTIONS: Language facilitation, Environmental controls, Cueing hierachy, Cognitive reorganization, Internal/external aids, Functional tasks, Multimodal communication approach, SLP instruction and feedback, Compensatory strategies, and Patient/Kelsey Jacobs education   [place], [place], CCC-SLP 02/17/2023, 3:15 PM

## 2023-02-17 NOTE — Patient Instructions (Addendum)
   How is work, Doctor, general practice?    When a phrase get easy to say, add a word to it to make it longer  Practice/add to device:  How is Larkin Ina?  Did he play this weekend?  How is Lilia Pro?  Are you going to [facility]?  We are go with Donia Guiles and Rich  I wear Windy Carina  1 X is my size  Basketball

## 2023-02-19 ENCOUNTER — Ambulatory Visit: Payer: Medicare Other | Admitting: Speech Pathology

## 2023-02-19 DIAGNOSIS — M6281 Muscle weakness (generalized): Secondary | ICD-10-CM | POA: Diagnosis not present

## 2023-02-19 DIAGNOSIS — R482 Apraxia: Secondary | ICD-10-CM

## 2023-02-19 DIAGNOSIS — R4701 Aphasia: Secondary | ICD-10-CM

## 2023-02-19 NOTE — Therapy (Signed)
OUTPATIENT SPEECH LANGUAGE PATHOLOGY TREATMENT   Patient Name: Kelsey Jacobs MRN: [id number] DOB:[birth year], 77 y.o., female 6 Date: 02/19/2023  PCP: Larene Beach MD REFERRING PROVIDER: Gwendolyn Lima, FNP  END OF SESSION:  End of Session - 02/19/23 1225     Visit Number 22    Number of Visits 53   added 18 visits for re-cert   Date for SLP Re-Evaluation 04/14/23    Authorization Type medicare    SLP Start Time [id number]    SLP Stop Time  1315    SLP Time Calculation (min) 45 min    Activity Tolerance Patient tolerated treatment well              Past Medical History:  Diagnosis Date   Medical history non-contributory    Past Surgical History:  Procedure Laterality Date   BACK SURGERY     FOOT SURGERY     INCISION AND DRAINAGE ABSCESS Left 03/29/2021   Procedure: INCISION AND DRAINAGE AXILLARY ABSCESS;  Surgeon: Jovita Kussmaul, MD;  Location: [facility];  Service: General;  Laterality: Left;   Patient Active Problem List   Diagnosis Date Noted   Axillary abscess 03/30/2021    ONSET DATE: 11/06/22   REFERRING DIAG: [id number] (ICD-10-CM) - Arterial ischemic stroke, MCA (middle cerebral artery), left, acute ([place]) Z74.09 (ICD-10-CM) - Other reduced mobility Z78.9 (ICD-10-CM) - Other specified health status I69.320 (ICD-10-CM) - Aphasia following cerebral infarction  THERAPY DIAG:  Aphasia  Verbal apraxia  Rationale for Evaluation and Treatment: Rehabilitation  SUBJECTIVE:   SUBJECTIVE STATEMENT: "I have been doing it" PAIN:  Are you having pain? No  OBJECTIVE:   TODAY'S TREATMENT:   02-19-23: Target participation in conversational speech with employment of multimodal communication and apraxia strategies. Pt takes 4+ turns in 2 conversations with use of processing strategies and verbal cues to use SGD to support communication breakdowns d/t apraxia errors. Pt demonstrating proficient navigation of device once given cues to use in conversation.  Encouraged Kelsey Jacobs to refer to device when he and son are unsure of Kelsey Jacobs's communicative intent. Ongoing education on incorporating principles of motor learning into home practice to facilitate improvement re: apraxia of speech.  02-17-23: Kelsey Jacobs enters with 2 accurate 4 word phrases about where she has been with extended time and self corrections. She continues to make progress. In conversation, Kelsey Jacobs utilized [employer], written words and gestures to augment speech to take 4 turns in a conversation, with rare min A. Targeted verbal apraxia increasing length of utterance and introducing new personally relevant phrases (see pt instructions) for conversations with her sisters' families. With modeling, fading choral speech, Kelsey Jacobs approximated 7/10 phrases. Provided them for home practice. Added [employer] to her SGD as she dined there with her sisters. Kelsey Jacobs will look online for clothes as well as continue to practice conversational phrases at home  02-14-23: Pt has been using [employer] to practice speech at home. Clinician provided visual aid to break target phrases into words. When provided model, pt demonstrated 100% accuracy with target phrase "how are you doing". Benefits from choral production and visual aid to support accuracy. Re-educated pt on importance of repetitive practice to establish new motor speech patterns and increase speech accuracy. Demonstrated how to incorporate into home practice and how pt's spouse can provide faded cues  Pt and husband reported that they have been singing at home. SLP employed Engineer, agricultural [employer] ([employer]) as apraxia strategy. Combined with faded model, choral reading, and tapping hand, pt's  accuracy improved at phrase level.  Pt endorses prior love of reading, has been unable to do since stroke. SLP educated pt on using Libby on her SGD to listen to [place]. Introduced pt to compensations she could use to improve her comprehension, such as reducing audio speed and pausing  to take breaks. Pt endorsed that she found the app helpful. SLP provided A to find appropriate settings to support pt's auditory comprehension.   PATIENT EDUCATION: Education details: Sees today's treatment and patient instructions, aphasia ed Person educated: Patient, Spouse, and Child(ren) Education method: Explanation, Demonstration, Verbal cues, and Handouts Education comprehension: verbalized understanding, verbal cues required, and needs further education   GOALS: Goals reviewed with patient? Yes  SHORT TERM GOALS: Target date: 12/25/22  Pt will name 5 family members with occasional mod A Baseline: Goal status: MET  2.  Pt will answer personally relevant yes/no questions accurately 85% with multimodal communication Baseline:  Goal status: MET  3.  Pt will ID 5 food and shopping preferences using multimodal communication with occasional min A Baseline:  Goal status: MET  4.  Pt will name 6 items in personally relevant categories using AAC/multimodal communication as needed with occasional mod A Baseline:  Goal status: NOT MET  5.  Family/caregivers will carryover 3 communication strategies to support pt's comprehension and expression Baseline:  Goal status: MET   LONG TERM GOALS: Target date: 02/18/22  Pt will initiate use mulitmodal communication to communicate wants/needs 3x over 1 week with usual min A from caregivers Baseline:  Goal status: MET  2.  Pt or caregivers will customize 4 items on AAC system with rare min A  Baseline:  Goal status: MET  3.  Pt will use multimodal communication to communicate 3 grocery items and 3 personal care items she needs with occasional min A from family Baseline:  Goal status: MET  4.  Pt will access online shopping site she uses and purchase a needed item with occasional  min A from family Baseline: ongoing goal for re-cert [id number] Goal status: ONGOING   5.  Using compensatory strategies and multimodal communication, pt  will take 3turns in conversation with occasional min A Baseline:  Goal status: MET  6. Pt will produce 3-5 word conversational phrases with frequent mod A in structured task Baseline: new goal for re-cert [id number] Goal Status: New  7. Pt will order 1-2 items dining out using multimodal communication over 3 restaurant meals with occasional min A from family             Baseline: new goal for recert [id number]             Goal Status: New  8. Kelsey Jacobs will use appropriate conversational phrases over phone calls with her family 1-2x over 4 phone calls or face time with occasional min a from family                Baseline: new goal for re-cert 0000000                Goal Status: New       ASSESSMENT:  CLINICAL IMPRESSION: Kelsey Jacobs has demonstrated good progress - she is using SGD with mod I, writing at word level to augment verbal communication. Severe verbal apraxia persists, however Kelsey Jacobs has progressed from producing 1 word utterances in structured tasks to producing 3 word utterances and social phrases with occasional min A. She continues to require max A to carryover these phrases into spontaneous conversation. Aphasia has improved  from severe to moderate. She remains frustrated with her difficulty communicating.  At this time, short term and long term goals met - Kelsey Jacobs remains motivated and practices daily, carrying over all homework, using aphasia apps to practice. I recommend she Continue skilled ST services to maximize verbal and non verbal communication for QOL, to reduce caregiver burden and life participation.   OBJECTIVE IMPAIRMENTS: include attention, memory, aphasia, and apraxia. These impairments are limiting patient from return to work, managing medications, managing appointments, managing finances, household responsibilities, ADLs/IADLs, and effectively communicating at home and in community. Factors affecting potential to achieve goals and functional outcome are severity of impairments. Patient  will benefit from skilled SLP services to address above impairments and improve overall function.  REHAB POTENTIAL: Good  PLAN:  SLP FREQUENCY: 2x/week  SLP DURATION: 12 weeks  PLANNED INTERVENTIONS: Language facilitation, Environmental controls, Cueing hierachy, Cognitive reorganization, Internal/external aids, Functional tasks, Multimodal communication approach, SLP instruction and feedback, Compensatory strategies, and Patient/family education   Su Monks, CCC-SLP 02/19/2023, 12:26 PM

## 2023-02-24 ENCOUNTER — Encounter: Payer: Self-pay | Admitting: Speech Pathology

## 2023-02-24 ENCOUNTER — Ambulatory Visit: Payer: Medicare Other | Admitting: Speech Pathology

## 2023-02-24 DIAGNOSIS — R482 Apraxia: Secondary | ICD-10-CM

## 2023-02-24 DIAGNOSIS — M6281 Muscle weakness (generalized): Secondary | ICD-10-CM | POA: Diagnosis not present

## 2023-02-24 DIAGNOSIS — R4701 Aphasia: Secondary | ICD-10-CM

## 2023-02-24 NOTE — Therapy (Signed)
OUTPATIENT SPEECH LANGUAGE PATHOLOGY TREATMENT   Patient Name: Kelsey Jacobs MRN: [id number] DOB:18-Sep-1946, 77 y.o., female 73 Date: 02/24/2023  PCP: Larene Beach MD REFERRING PROVIDER: Gwendolyn Lima, FNP  END OF SESSION:  End of Session - 02/24/23 1237     Visit Number 23    Number of Visits 57    Date for SLP Re-Evaluation 04/14/23    Authorization Type medicare    SLP Start Time 81    SLP Stop Time  1315    SLP Time Calculation (min) 45 min    Activity Tolerance Patient tolerated treatment well              Past Medical History:  Diagnosis Date   Medical history non-contributory    Past Surgical History:  Procedure Laterality Date   BACK SURGERY     FOOT SURGERY     INCISION AND DRAINAGE ABSCESS Left 03/29/2021   Procedure: INCISION AND DRAINAGE AXILLARY ABSCESS;  Surgeon: Jovita Kussmaul, MD;  Location: [facility];  Service: General;  Laterality: Left;   Patient Active Problem List   Diagnosis Date Noted   Axillary abscess 03/30/2021    ONSET DATE: 11/06/22   REFERRING DIAG: [id number] (ICD-10-CM) - Arterial ischemic stroke, MCA (middle cerebral artery), left, acute ([place]) Z74.09 (ICD-10-CM) - Other reduced mobility Z78.9 (ICD-10-CM) - Other specified health status I69.320 (ICD-10-CM) - Aphasia following cerebral infarction  THERAPY DIAG:  Aphasia  Verbal apraxia  Rationale for Evaluation and Treatment: Rehabilitation  SUBJECTIVE:   SUBJECTIVE STATEMENT: "She's getting better, more words are coming out" PAIN:  Are you having pain? No  OBJECTIVE:   TODAY'S TREATMENT:    02-24-23: Al and son [name] present. Targeted verbal apraxia using social and personally relevant phrases with fading placement cues. Pema repeated 8 phrases/conversation questions specific to family members 8/10 intelligible. Targeted verbal apraxia repeating 3-4 syllables words, 80% intelligible, then generating simple sentences with carrier phrase using 3 -4 syllable word  with fading choral, placement cues with 70% accuracy. With family assistance, Jayva did shop on [employer], selected and purchased new pajamas. They plan to attend aphasia group tomorrow   02-19-23: Target participation in conversational speech with employment of multimodal communication and apraxia strategies. Pt takes 4+ turns in 2 conversations with use of processing strategies and verbal cues to use SGD to support communication breakdowns d/t apraxia errors. Pt demonstrating proficient navigation of device once given cues to use in conversation. Encouraged Al to refer to device when he and son are unsure of Deja's communicative intent. Ongoing education on incorporating principles of motor learning into home practice to facilitate improvement re: apraxia of speech.  02-17-23: Tiaira enters with 2 accurate 4 word phrases about where she has been with extended time and self corrections. She continues to make progress. In conversation, Teyanna utilized [employer], written words and gestures to augment speech to take 4 turns in a conversation, with rare min A. Targeted verbal apraxia increasing length of utterance and introducing new personally relevant phrases (see pt instructions) for conversations with her sisters' families. With modeling, fading choral speech, Charlsie approximated 7/10 phrases. Provided them for home practice. Added [employer] to her SGD as she dined there with her sisters. Caterra will look online for clothes as well as continue to practice conversational phrases at home  02-14-23: Pt has been using [employer] to practice speech at home. Clinician provided visual aid to break target phrases into words. When provided model, pt demonstrated 100% accuracy with  target phrase "how are you doing". Benefits from choral production and visual aid to support accuracy. Re-educated pt on importance of repetitive practice to establish new motor speech patterns and increase speech accuracy. Demonstrated how to  incorporate into home practice and how pt's spouse can provide faded cues  Pt and husband reported that they have been singing at home. SLP employed Engineer, agricultural Therapy ([employer]) as apraxia strategy. Combined with faded model, choral reading, and tapping hand, pt's accuracy improved at phrase level.  Pt endorses prior love of reading, has been unable to do since stroke. SLP educated pt on using Libby on her SGD to listen to [place]. Introduced pt to compensations she could use to improve her comprehension, such as reducing audio speed and pausing to take breaks. Pt endorsed that she found the app helpful. SLP provided A to find appropriate settings to support pt's auditory comprehension.   PATIENT EDUCATION: Education details: Sees today's treatment and patient instructions, aphasia ed Person educated: Patient, Spouse, and Child(ren) Education method: Explanation, Demonstration, Verbal cues, and Handouts Education comprehension: verbalized understanding, verbal cues required, and needs further education   GOALS: Goals reviewed with patient? Yes  SHORT TERM GOALS: Target date: 12/25/22  Pt will name 5 family members with occasional mod A Baseline: Goal status: MET  2.  Pt will answer personally relevant yes/no questions accurately 85% with multimodal communication Baseline:  Goal status: MET  3.  Pt will ID 5 food and shopping preferences using multimodal communication with occasional min A Baseline:  Goal status: MET  4.  Pt will name 6 items in personally relevant categories using AAC/multimodal communication as needed with occasional mod A Baseline:  Goal status: NOT MET  5.  Family/caregivers will carryover 3 communication strategies to support pt's comprehension and expression Baseline:  Goal status: MET   LONG TERM GOALS: Target date: 04/14/23  Pt will initiate use mulitmodal communication to communicate wants/needs 3x over 1 week with usual min A from  caregivers Baseline:  Goal status: MET  2.  Pt or caregivers will customize 4 items on AAC system with rare min A  Baseline:  Goal status: MET  3.  Pt will use multimodal communication to communicate 3 grocery items and 3 personal care items she needs with occasional min A from family Baseline:  Goal status: MET  4.  Pt will access online shopping site she uses and purchase a needed item with occasional  min A from family Baseline: ongoing goal for re-cert [id number] Goal status: ONGOING   5.  Using compensatory strategies and multimodal communication, pt will take 3turns in conversation with occasional min A Baseline:  Goal status: MET  6. Pt will produce 3-5 word conversational phrases with frequent mod A in structured task Baseline: new goal for re-cert [id number] Goal Status: ONGOING  7. Pt will order 1-2 items dining out using multimodal communication over 3 restaurant meals with occasional min A from family             Baseline: new goal for recert [id number]             Goal Status: New  8. Pearlette will use appropriate conversational phrases over phone calls with her family 1-2x over 4 phone calls or face time with occasional min a from family                Baseline: new goal for re-cert 0000000  Goal Status: New       ASSESSMENT:  CLINICAL IMPRESSION: Magdline has demonstrated good progress - she is using SGD with mod I, writing at word level to augment verbal communication. Severe verbal apraxia persists, however Aridai has progressed from producing 1 word utterances in structured tasks to producing 3 word utterances and social phrases with occasional min A. She continues to require max A to carryover these phrases into spontaneous conversation. Aphasia has improved from severe to moderate. She remains frustrated with her difficulty communicating.  At this time, short term and long term goals met - Kliyah remains motivated and practices daily, carrying over all homework, using  aphasia apps to practice. I recommend she Continue skilled ST services to maximize verbal and non verbal communication for QOL, to reduce caregiver burden and life participation.   OBJECTIVE IMPAIRMENTS: include attention, memory, aphasia, and apraxia. These impairments are limiting patient from return to work, managing medications, managing appointments, managing finances, household responsibilities, ADLs/IADLs, and effectively communicating at home and in community. Factors affecting potential to achieve goals and functional outcome are severity of impairments. Patient will benefit from skilled SLP services to address above impairments and improve overall function.  REHAB POTENTIAL: Good  PLAN:  SLP FREQUENCY: 2x/week  SLP DURATION: 12 weeks  PLANNED INTERVENTIONS: Language facilitation, Environmental controls, Cueing hierachy, Cognitive reorganization, Internal/external aids, Functional tasks, Multimodal communication approach, SLP instruction and feedback, Compensatory strategies, and Patient/family education   Donella, Anzivino, CCC-SLP 02/24/2023, 3:07 PM

## 2023-02-24 NOTE — Patient Instructions (Signed)
    [place]  Conservator, museum/gallery  Basketball  Hippopotamus  Daffodil  Coconut  Factory  Handkerchief  [employer]  I drive a [employer]  Vitamin  I take a vitamin  [place]  Battery  Triple A Battery

## 2023-02-25 ENCOUNTER — Encounter: Payer: Self-pay | Admitting: Speech Pathology

## 2023-02-26 ENCOUNTER — Encounter: Payer: Self-pay | Admitting: Speech Pathology

## 2023-02-26 ENCOUNTER — Ambulatory Visit: Payer: Medicare Other | Admitting: Speech Pathology

## 2023-02-26 DIAGNOSIS — R4701 Aphasia: Secondary | ICD-10-CM

## 2023-02-26 DIAGNOSIS — M6281 Muscle weakness (generalized): Secondary | ICD-10-CM | POA: Diagnosis not present

## 2023-02-26 DIAGNOSIS — R482 Apraxia: Secondary | ICD-10-CM

## 2023-02-26 NOTE — Patient Instructions (Signed)
   Chocolate  [place]  Viking cruise  [name] is going to [place]  I want to go on a [place] fries  I'll have the [place]  We go to [employer]

## 2023-02-26 NOTE — Therapy (Signed)
OUTPATIENT SPEECH LANGUAGE PATHOLOGY TREATMENT   Patient Name: Kelsey Jacobs MRN: [id number] DOB:[birth year], 77 y.o., female 10 Date: 02/26/2023  PCP: Kelsey Beach MD REFERRING PROVIDER: Gwendolyn Lima, FNP  END OF SESSION:  End of Session - 02/26/23 1233     Visit Number 24    Number of Visits 23    Date for SLP Re-Evaluation 04/14/23    Authorization Type medicare              Past Medical History:  Diagnosis Date   Medical history non-contributory    Past Surgical History:  Procedure Laterality Date   BACK SURGERY     FOOT SURGERY     INCISION AND DRAINAGE ABSCESS Left 03/29/2021   Procedure: INCISION AND DRAINAGE AXILLARY ABSCESS;  Surgeon: Kelsey Kussmaul, MD;  Location: [facility];  Service: General;  Laterality: Left;   Patient Active Problem List   Diagnosis Date Noted   Axillary abscess 03/30/2021    ONSET DATE: 11/06/22   REFERRING DIAG: [id number] (ICD-10-CM) - Arterial ischemic stroke, MCA (middle cerebral artery), left, acute ([place]) Z74.09 (ICD-10-CM) - Other reduced mobility Z78.9 (ICD-10-CM) - Other specified health status I69.320 (ICD-10-CM) - Aphasia following cerebral infarction  THERAPY DIAG:  Aphasia  Verbal apraxia  Rationale for Evaluation and Treatment: Rehabilitation  SUBJECTIVE:   SUBJECTIVE STATEMENT: ""She had a bad day yesterday" PAIN:  Are you having pain? No  OBJECTIVE:   TODAY'S TREATMENT:    02-26-23: Targeted verba apraxia repeating phrases with carrier phrase, increasing length to 5 words with 60% accuracy and consistent slow rate, choral speech fading to placement cues. Targeted placing order, repeating her order "cuban reuben" with choral fading to placement, increasing length the carrier phrase "I'll have a....[employer]" for cuban, truffle fries and water with 80% accuracy. Repetition of multisyllabic words re: the next vacation she hopes to take with 80% accuracy at word level, 60% accuracy at phrase level with  fading  choral speech. Added viking cruise and [place] to SGD  02-24-23: Kelsey Jacobs and son Kelsey Jacobs present. Targeted verbal apraxia using social and personally relevant phrases with fading placement cues. Kelsey Jacobs repeated 8 phrases/conversation questions specific to family members 8/10 intelligible. Targeted verbal apraxia repeating 3-4 syllables words, 80% intelligible, then generating simple sentences with carrier phrase using 3 -4 syllable word with fading choral, placement cues with 70% accuracy. With family assistance, [name] did shop on [employer], selected and purchased new pajamas. They plan to attend aphasia group tomorrow   02-19-23: Target participation in conversational speech with employment of multimodal communication and apraxia strategies. Pt takes 4+ turns in 2 conversations with use of processing strategies and verbal cues to use SGD to support communication breakdowns d/t apraxia errors. Pt demonstrating proficient navigation of device once given cues to use in conversation. Encouraged Kelsey Jacobs to refer to device when he and son are unsure of [name]'s communicative intent. Ongoing education on incorporating principles of motor learning into home practice to facilitate improvement re: apraxia of speech.  02-17-23: Kelsey Jacobs enters with 2 accurate 4 word phrases about where she has been with extended time and self corrections. She continues to make progress. In conversation, Kelsey Jacobs utilized [employer], written words and gestures to augment speech to take 4 turns in a conversation, with rare min A. Targeted verbal apraxia increasing length of utterance and introducing new personally relevant phrases (see pt instructions) for conversations with her sisters' families. With modeling, fading choral speech, Kelsey Jacobs approximated 7/10 phrases. Provided them for home practice. Added  [place] to her SGD as she dined there with her sisters. Kelsey Jacobs will look online for clothes as well as continue to practice conversational phrases at home  02-14-23: Pt  has been using [employer] to practice speech at home. Clinician provided visual aid to break target phrases into words. When provided model, pt demonstrated 100% accuracy with target phrase "how are you doing". Benefits from choral production and visual aid to support accuracy. Re-educated pt on importance of repetitive practice to establish new motor speech patterns and increase speech accuracy. Demonstrated how to incorporate into home practice and how pt's spouse can provide faded cues  Pt and husband reported that they have been singing at home. SLP employed Engineer, agricultural Therapy ([employer]) as apraxia strategy. Combined with faded model, choral reading, and tapping hand, pt's accuracy improved at phrase level.  Pt endorses prior love of reading, has been unable to do since stroke. SLP educated pt on using Libby on her SGD to listen to [place]. Introduced pt to compensations she could use to improve her comprehension, such as reducing audio speed and pausing to take breaks. Pt endorsed that she found the app helpful. SLP provided A to find appropriate settings to support pt's auditory comprehension.   PATIENT EDUCATION: Education details: Sees today's treatment and patient instructions, aphasia ed Person educated: Patient, Spouse, and Child(ren) Education method: Explanation, Demonstration, Verbal cues, and Handouts Education comprehension: verbalized understanding, verbal cues required, and needs further education   GOALS: Goals reviewed with patient? Yes  SHORT TERM GOALS: Target date: 12/25/22  Pt will name 5 family members with occasional mod A Baseline: Goal status: MET  2.  Pt will answer personally relevant yes/no questions accurately 85% with multimodal communication Baseline:  Goal status: MET  3.  Pt will ID 5 food and shopping preferences using multimodal communication with occasional min A Baseline:  Goal status: MET  4.  Pt will name 6 items in personally relevant  categories using AAC/multimodal communication as needed with occasional mod A Baseline:  Goal status: NOT MET  5.  Family/caregivers will carryover 3 communication strategies to support pt's comprehension and expression Baseline:  Goal status: MET   LONG TERM GOALS: Target date: 04/14/23  Pt will initiate use mulitmodal communication to communicate wants/needs 3x over 1 week with usual min A from caregivers Baseline:  Goal status: MET  2.  Pt or caregivers will customize 4 items on AAC system with rare min A  Baseline:  Goal status: MET  3.  Pt will use multimodal communication to communicate 3 grocery items and 3 personal care items she needs with occasional min A from family Baseline:  Goal status: MET  4.  Pt will access online shopping site she uses and purchase a needed item with occasional  min A from family Baseline: ongoing goal for re-cert [id number] Goal status: ONGOING   5.  Using compensatory strategies and multimodal communication, pt will take 3turns in conversation with occasional min A Baseline:  Goal status: MET  6. Pt will produce 3-5 word conversational phrases with frequent mod A in structured task Baseline: new goal for re-cert [id number] Goal Status: ONGOING  7. Pt will order 1-2 items dining out using multimodal communication over 3 restaurant meals with occasional min A from family             Baseline: new goal for recert [id number]             Goal Status: New  8. Genell will  use appropriate conversational phrases over phone calls with her family 1-2x over 4 phone calls or face time with occasional min a from family                Baseline: new goal for re-cert 0000000                Goal Status: New       ASSESSMENT:  CLINICAL IMPRESSION: Emrey has demonstrated good progress - she is using SGD with mod I, writing at word level to augment verbal communication. Severe verbal apraxia persists, however Marhta has progressed from producing 1 word utterances in  structured tasks to producing 3 word utterances and social phrases with occasional min A. She continues to require max A to carryover these phrases into spontaneous conversation. Aphasia has improved from severe to moderate. She remains frustrated with her difficulty communicating.  At this time, short term and long term goals met - Phyliss remains motivated and practices daily, carrying over all homework, using aphasia apps to practice. I recommend she Continue skilled ST services to maximize verbal and non verbal communication for QOL, to reduce caregiver burden and life participation.   OBJECTIVE IMPAIRMENTS: include attention, memory, aphasia, and apraxia. These impairments are limiting patient from return to work, managing medications, managing appointments, managing finances, household responsibilities, ADLs/IADLs, and effectively communicating at home and in community. Factors affecting potential to achieve goals and functional outcome are severity of impairments. Patient will benefit from skilled SLP services to address above impairments and improve overall function.  REHAB POTENTIAL: Good  PLAN:  SLP FREQUENCY: 2x/week  SLP DURATION: 12 weeks  PLANNED INTERVENTIONS: Language facilitation, Environmental controls, Cueing hierachy, Cognitive reorganization, Internal/external aids, Functional tasks, Multimodal communication approach, SLP instruction and feedback, Compensatory strategies, and Patient/family education   Madee, Brodowski, CCC-SLP 02/26/2023, 2:57 PM

## 2023-03-03 ENCOUNTER — Ambulatory Visit: Payer: Medicare Other

## 2023-03-03 DIAGNOSIS — M6281 Muscle weakness (generalized): Secondary | ICD-10-CM | POA: Diagnosis not present

## 2023-03-03 DIAGNOSIS — R4701 Aphasia: Secondary | ICD-10-CM

## 2023-03-03 DIAGNOSIS — R482 Apraxia: Secondary | ICD-10-CM

## 2023-03-03 NOTE — Therapy (Signed)
OUTPATIENT SPEECH LANGUAGE PATHOLOGY TREATMENT   Patient Name: Kelsey Jacobs MRN: [id number] DOB:1946-06-07, 77 y.o., female 71 Date: 03/03/2023  PCP: Kelsey Beach MD REFERRING PROVIDER: Gwendolyn Lima, FNP  END OF SESSION:  End of Session - 03/03/23 1247     Visit Number 25    Number of Visits 31    Date for SLP Re-Evaluation 04/14/23    Authorization Type medicare    SLP Start Time 66    SLP Stop Time  [id number]    SLP Time Calculation (min) 45 min             Past Medical History:  Diagnosis Date   Medical history non-contributory    Past Surgical History:  Procedure Laterality Date   BACK SURGERY     FOOT SURGERY     INCISION AND DRAINAGE ABSCESS Left 03/29/2021   Procedure: INCISION AND DRAINAGE AXILLARY ABSCESS;  Surgeon: Kelsey Kussmaul, MD;  Location: [facility];  Service: General;  Laterality: Left;   Patient Active Problem List   Diagnosis Date Noted   Axillary abscess 03/30/2021    ONSET DATE: 11/06/22   REFERRING DIAG: [id number] (ICD-10-CM) - Arterial ischemic stroke, MCA (middle cerebral artery), left, acute ([place]) Z74.09 (ICD-10-CM) - Other reduced mobility Z78.9 (ICD-10-CM) - Other specified health status I69.320 (ICD-10-CM) - Aphasia following cerebral infarction  THERAPY DIAG:  Aphasia  Verbal apraxia  Rationale for Evaluation and Treatment: Rehabilitation  SUBJECTIVE:   SUBJECTIVE STATEMENT: "I'm good." "We had to put our dog down Saturday"  PAIN:  Are you having pain? No  OBJECTIVE:   TODAY'S TREATMENT:   03-03-23: Continued to target verbal apraxia utilizing multimodal communication, including writing on SGD, gestures, and speech. Given consistent max cues and models, pt with 60% accuracy in targeted speech productions at word and phrase level. SLP reiterated importance of speaking slowly and to repeat a word at least once more when she produces it correctly to reinforce the proper motor movement. SLP facilitated discussion on  favorite media and provided scaffolding to support conversation on TV shows (The Resident, [place]) and books via answering "wh" questions (who, what, when, where). Pt endorsed this was helpful and plans to practice at home. Pt continues to benefit from written and spoken models and faded choral speech.   02-26-23: Targeted verba apraxia repeating phrases with carrier phrase, increasing length to 5 words with 60% accuracy and consistent slow rate, choral speech fading to placement cues. Targeted placing order, repeating her order "cuban reuben" with choral fading to placement, increasing length the carrier phrase "I'll have a....Marland Kitchen" for cuban, truffle fries and water with 80% accuracy. Repetition of multisyllabic words re: the next vacation she hopes to take with 80% accuracy at word level, 60% accuracy at phrase level with  fading choral speech. Added viking cruise and [place] to SGD  02-24-23: Al and son Kelsey Jacobs present. Targeted verbal apraxia using social and personally relevant phrases with fading placement cues. Kelsey Jacobs repeated 8 phrases/conversation questions specific to family members 8/10 intelligible. Targeted verbal apraxia repeating 3-4 syllables words, 80% intelligible, then generating simple sentences with carrier phrase using 3 -4 syllable word with fading choral, placement cues with 70% accuracy. With family assistance, Kelsey Jacobs did shop on [employer], selected and purchased new pajamas. They plan to attend aphasia group tomorrow   02-19-23: Target participation in conversational speech with employment of multimodal communication and apraxia strategies. Pt takes 4+ turns in 2 conversations with use of processing strategies and verbal cues to  use SGD to support communication breakdowns d/t apraxia errors. Pt demonstrating proficient navigation of device once given cues to use in conversation. Encouraged Al to refer to device when he and son are unsure of Kelsey Jacobs's communicative intent. Ongoing education on  incorporating principles of motor learning into home practice to facilitate improvement re: apraxia of speech.  PATIENT EDUCATION: Education details: Sees today's treatment and patient instructions, aphasia ed Person educated: Patient, Spouse, and Child(ren) Education method: Explanation, Demonstration, Verbal cues, and Handouts Education comprehension: verbalized understanding, verbal cues required, and needs further education   GOALS: Goals reviewed with patient? Yes  SHORT TERM GOALS: Target date: 12/25/22  Pt will name 5 family members with occasional mod A Baseline: Goal status: MET  2.  Pt will answer personally relevant yes/no questions accurately 85% with multimodal communication Baseline:  Goal status: MET  3.  Pt will ID 5 food and shopping preferences using multimodal communication with occasional min A Baseline:  Goal status: MET  4.  Pt will name 6 items in personally relevant categories using AAC/multimodal communication as needed with occasional mod A Baseline:  Goal status: NOT MET  5.  Family/caregivers will carryover 3 communication strategies to support pt's comprehension and expression Baseline:  Goal status: MET   LONG TERM GOALS: Target date: 04/14/23  Pt will initiate use mulitmodal communication to communicate wants/needs 3x over 1 week with usual min A from caregivers Baseline:  Goal status: MET  2.  Pt or caregivers will customize 4 items on AAC system with rare min A  Baseline:  Goal status: MET  3.  Pt will use multimodal communication to communicate 3 grocery items and 3 personal care items she needs with occasional min A from family Baseline:  Goal status: MET  4.  Pt will access online shopping site she uses and purchase a needed item with occasional  min A from family Baseline: ongoing goal for re-cert [id number] Goal status: ONGOING   5.  Using compensatory strategies and multimodal communication, pt will take 3turns in conversation with  occasional min A Baseline:  Goal status: MET  6. Pt will produce 3-5 word conversational phrases with frequent mod A in structured task Baseline: new goal for re-cert [id number] Goal Status: ONGOING  7. Pt will order 1-2 items dining out using multimodal communication over 3 restaurant meals with occasional min A from family             Baseline: new goal for recert [id number]             Goal Status: New  8. Lakish will use appropriate conversational phrases over phone calls with her family 1-2x over 4 phone calls or face time with occasional min a from family                Baseline: new goal for re-cert 0000000                Goal Status: New       ASSESSMENT:  CLINICAL IMPRESSION: Endora has demonstrated good progress - she is using SGD with mod I, writing at word level to augment verbal communication. Severe verbal apraxia persists, however Laiklynn has progressed from producing 1 word utterances in structured tasks to producing 3 word utterances and social phrases with occasional min A. She continues to require max A to carryover these phrases into spontaneous conversation. Aphasia has improved from severe to moderate. She remains frustrated with her difficulty communicating.  At this time, short term and  long term goals met - Aileah remains motivated and practices daily, carrying over all homework, using aphasia apps to practice. I recommend she Continue skilled ST services to maximize verbal and non verbal communication for QOL, to reduce caregiver burden and life participation.   OBJECTIVE IMPAIRMENTS: include attention, memory, aphasia, and apraxia. These impairments are limiting patient from return to work, managing medications, managing appointments, managing finances, household responsibilities, ADLs/IADLs, and effectively communicating at home and in community. Factors affecting potential to achieve goals and functional outcome are severity of impairments. Patient will benefit from skilled SLP  services to address above impairments and improve overall function.  REHAB POTENTIAL: Good  PLAN:  SLP FREQUENCY: 2x/week  SLP DURATION: 12 weeks  PLANNED INTERVENTIONS: Language facilitation, Environmental controls, Cueing hierachy, Cognitive reorganization, Internal/external aids, Functional tasks, Multimodal communication approach, SLP instruction and feedback, Compensatory strategies, and Patient/family education   Leroy Libman, Student-SLP 03/03/2023, 1:11 PM

## 2023-03-03 NOTE — Patient Instructions (Addendum)
Consider adding a folder for TV Shows on device  Write down shows you watch, such as on [employer] The Resident  [place]   Speak slowly When you get a word correct, try to say it one more time!  When talking about books and TV shows, try to summarize by answering: Who?  What? When?  Where?

## 2023-03-04 ENCOUNTER — Ambulatory Visit: Payer: Medicare Other | Admitting: Speech Pathology

## 2023-03-04 DIAGNOSIS — M6281 Muscle weakness (generalized): Secondary | ICD-10-CM | POA: Diagnosis not present

## 2023-03-04 DIAGNOSIS — R4701 Aphasia: Secondary | ICD-10-CM

## 2023-03-04 DIAGNOSIS — R482 Apraxia: Secondary | ICD-10-CM

## 2023-03-04 NOTE — Therapy (Signed)
OUTPATIENT SPEECH LANGUAGE PATHOLOGY TREATMENT   Patient Name: Kelsey Jacobs MRN: [id number] DOB:19-Jan-1946, 77 y.o., female 23 Date: 03/04/2023  PCP: Larene Beach MD REFERRING PROVIDER: Gwendolyn Lima, FNP  END OF SESSION:  End of Session - 03/04/23 1228     Visit Number 26    Number of Visits 39    Date for SLP Re-Evaluation 04/14/23    Authorization Type medicare    SLP Start Time 55    SLP Stop Time  1308    SLP Time Calculation (min) 38 min    Activity Tolerance Other (comment)   limited by frsutration             Past Medical History:  Diagnosis Date   Medical history non-contributory    Past Surgical History:  Procedure Laterality Date   BACK SURGERY     FOOT SURGERY     INCISION AND DRAINAGE ABSCESS Left 03/29/2021   Procedure: INCISION AND DRAINAGE AXILLARY ABSCESS;  Surgeon: Jovita Kussmaul, MD;  Location: [facility];  Service: General;  Laterality: Left;   Patient Active Problem List   Diagnosis Date Noted   Axillary abscess 03/30/2021    ONSET DATE: 11/06/22   REFERRING DIAG: [id number] (ICD-10-CM) - Arterial ischemic stroke, MCA (middle cerebral artery), left, acute ([place]) Z74.09 (ICD-10-CM) - Other reduced mobility Z78.9 (ICD-10-CM) - Other specified health status I69.320 (ICD-10-CM) - Aphasia following cerebral infarction  THERAPY DIAG:  Aphasia  Verbal apraxia  Rationale for Evaluation and Treatment: Rehabilitation  SUBJECTIVE:   SUBJECTIVE STATEMENT: "Really good yesterday"  PAIN:  Are you having pain? No  OBJECTIVE:   TODAY'S TREATMENT:   03-04-23: Attempt phrase level speech during structured language task, modified semantic feature analysis. Pt requiring max cues this date, overall accuracy 40% at 3 word phrase level. Pt with difficulty answering questions to complete structured language task, benefits from usual repetition and phonemic cues to describe requested feature. Pt with increasing frustration re: challenges in  correcting motor speech so activity ended. SLP facilitated list generation of preferred authors using visual cues. Spontaneous use of white board feature x2 when communication breakdown occurs.   03-03-23: Continued to target verbal apraxia utilizing multimodal communication, including writing on SGD, gestures, and speech. Given consistent max cues and models, pt with 60% accuracy in targeted speech productions at word and phrase level. SLP reiterated importance of speaking slowly and to repeat a word at least once more when she produces it correctly to reinforce the proper motor movement. SLP facilitated discussion on favorite media and provided scaffolding to support conversation on TV shows ([place]ident, [place]) and books via answering "wh" questions (who, what, when, where). Pt endorsed this was helpful and plans to practice at home. Pt continues to benefit from written and spoken models and faded choral speech.   02-26-23: Targeted verba apraxia repeating phrases with carrier phrase, increasing length to 5 words with 60% accuracy and consistent slow rate, choral speech fading to placement cues. Targeted placing order, repeating her order "cuban reuben" with choral fading to placement, increasing length the carrier phrase "I'll have a....Marland Kitchen" for cuban, truffle fries and water with 80% accuracy. Repetition of multisyllabic words re: the next vacation she hopes to take with 80% accuracy at word level, 60% accuracy at phrase level with  fading choral speech. Added viking cruise and [place] to SGD  02-24-23: Al and son [name] present. Targeted verbal apraxia using social and personally relevant phrases with fading placement cues. Apryl repeated 8  phrases/conversation questions specific to family members 8/10 intelligible. Targeted verbal apraxia repeating 3-4 syllables words, 80% intelligible, then generating simple sentences with carrier phrase using 3 -4 syllable word with fading choral, placement cues  with 70% accuracy. With family assistance, Karlia did shop on [employer], selected and purchased new pajamas. They plan to attend aphasia group tomorrow   02-19-23: Target participation in conversational speech with employment of multimodal communication and apraxia strategies. Pt takes 4+ turns in 2 conversations with use of processing strategies and verbal cues to use SGD to support communication breakdowns d/t apraxia errors. Pt demonstrating proficient navigation of device once given cues to use in conversation. Encouraged Al to refer to device when he and son are unsure of Desarae's communicative intent. Ongoing education on incorporating principles of motor learning into home practice to facilitate improvement re: apraxia of speech.  PATIENT EDUCATION: Education details: Sees today's treatment and patient instructions, aphasia ed Person educated: Patient, Spouse, and Child(ren) Education method: Explanation, Demonstration, Verbal cues, and Handouts Education comprehension: verbalized understanding, verbal cues required, and needs further education   GOALS: Goals reviewed with patient? Yes  SHORT TERM GOALS: Target date: 12/25/22  Pt will name 5 family members with occasional mod A Baseline: Goal status: MET  2.  Pt will answer personally relevant yes/no questions accurately 85% with multimodal communication Baseline:  Goal status: MET  3.  Pt will ID 5 food and shopping preferences using multimodal communication with occasional min A Baseline:  Goal status: MET  4.  Pt will name 6 items in personally relevant categories using AAC/multimodal communication as needed with occasional mod A Baseline:  Goal status: NOT MET  5.  Family/caregivers will carryover 3 communication strategies to support pt's comprehension and expression Baseline:  Goal status: MET   LONG TERM GOALS: Target date: 04/14/23  Pt will initiate use mulitmodal communication to communicate wants/needs 3x over 1 week with  usual min A from caregivers Baseline:  Goal status: MET  2.  Pt or caregivers will customize 4 items on AAC system with rare min A  Baseline:  Goal status: MET  3.  Pt will use multimodal communication to communicate 3 grocery items and 3 personal care items she needs with occasional min A from family Baseline:  Goal status: MET  4.  Pt will access online shopping site she uses and purchase a needed item with occasional  min A from family Baseline: ongoing goal for re-cert [id number] Goal status: ONGOING   5.  Using compensatory strategies and multimodal communication, pt will take 3turns in conversation with occasional min A Baseline:  Goal status: MET  6. Pt will produce 3-5 word conversational phrases with frequent mod A in structured task Baseline: new goal for re-cert [id number] Goal Status: ONGOING  7. Pt will order 1-2 items dining out using multimodal communication over 3 restaurant meals with occasional min A from family             Baseline: new goal for recert [id number]             Goal Status: New  8. Jericca will use appropriate conversational phrases over phone calls with her family 1-2x over 4 phone calls or face time with occasional min a from family                Baseline: new goal for re-cert 0000000                Goal Status: New  ASSESSMENT:  CLINICAL IMPRESSION: Aubrey has demonstrated good progress - she is using SGD with mod I, writing at word level to augment verbal communication. Severe verbal apraxia persists, however Tammela has progressed from producing 1 word utterances in structured tasks to producing 3 word utterances and social phrases with occasional min A. She continues to require max A to carryover these phrases into spontaneous conversation. Aphasia has improved from severe to moderate. She remains frustrated with her difficulty communicating.  At this time, short term and long term goals met - Tiffiny remains motivated and practices daily, carrying over all  homework, using aphasia apps to practice. I recommend she Continue skilled ST services to maximize verbal and non verbal communication for QOL, to reduce caregiver burden and life participation.   OBJECTIVE IMPAIRMENTS: include attention, memory, aphasia, and apraxia. These impairments are limiting patient from return to work, managing medications, managing appointments, managing finances, household responsibilities, ADLs/IADLs, and effectively communicating at home and in community. Factors affecting potential to achieve goals and functional outcome are severity of impairments. Patient will benefit from skilled SLP services to address above impairments and improve overall function.  REHAB POTENTIAL: Good  PLAN:  SLP FREQUENCY: 2x/week  SLP DURATION: 12 weeks  PLANNED INTERVENTIONS: Language facilitation, Environmental controls, Cueing hierachy, Cognitive reorganization, Internal/external aids, Functional tasks, Multimodal communication approach, SLP instruction and feedback, Compensatory strategies, and Patient/family education   Su Monks, CCC-SLP 03/04/2023, 1:08 PM

## 2023-03-12 ENCOUNTER — Ambulatory Visit: Payer: Medicare Other | Attending: Nurse Practitioner | Admitting: Speech Pathology

## 2023-03-12 ENCOUNTER — Encounter: Payer: Self-pay | Admitting: Speech Pathology

## 2023-03-12 DIAGNOSIS — R4701 Aphasia: Secondary | ICD-10-CM

## 2023-03-12 DIAGNOSIS — R482 Apraxia: Secondary | ICD-10-CM | POA: Diagnosis present

## 2023-03-12 NOTE — Therapy (Signed)
OUTPATIENT SPEECH LANGUAGE PATHOLOGY TREATMENT   Patient Name: Kelsey Jacobs MRN: [id number] DOB:[birth year], 77 y.o., female 73 Date: 03/12/2023  PCP: Larene Beach MD REFERRING PROVIDER: Gwendolyn Lima, FNP  END OF SESSION:  End of Session - 03/12/23 1505     Visit Number 27    Number of Visits 62    Date for SLP Re-Evaluation 04/14/23    Authorization Type medicare    SLP Start Time 1315    SLP Stop Time  1400    SLP Time Calculation (min) 45 min    Activity Tolerance Patient tolerated treatment well              Past Medical History:  Diagnosis Date   Medical history non-contributory    Past Surgical History:  Procedure Laterality Date   BACK SURGERY     FOOT SURGERY     INCISION AND DRAINAGE ABSCESS Left 03/29/2021   Procedure: INCISION AND DRAINAGE AXILLARY ABSCESS;  Surgeon: Jovita Kussmaul, MD;  Location: [facility];  Service: General;  Laterality: Left;   Patient Active Problem List   Diagnosis Date Noted   Axillary abscess 03/30/2021    ONSET DATE: 11/06/22   REFERRING DIAG: [id number] (ICD-10-CM) - Arterial ischemic stroke, MCA (middle cerebral artery), left, acute ([place]) Z74.09 (ICD-10-CM) - Other reduced mobility Z78.9 (ICD-10-CM) - Other specified health status I69.320 (ICD-10-CM) - Aphasia following cerebral infarction  THERAPY DIAG:  Aphasia  Verbal apraxia  Rationale for Evaluation and Treatment: Rehabilitation  SUBJECTIVE:   SUBJECTIVE STATEMENT: "Really good yesterday"  PAIN:  Are you having pain? No  OBJECTIVE:   TODAY'S TREATMENT:   03-12-23: Targeted verbal apraxia in everyday sentences with carrier phrases of increasing length. I eat at..., I watch, ,I go to, I shop at, With frequent mod to max A, (fading choral cues) Tashianna repeated 15 phrases with 65% intelligibility. Sentence length 4-7 words. In conversation, Danissa used AAC/multimodal communication to take 4 turns. She asked 2 questions with AAC. Ongoing frustration required  cues to redirect.    03-04-23: Attempt phrase level speech during structured language task, modified semantic feature analysis. Pt requiring max cues this date, overall accuracy 40% at 3 word phrase level. Pt with difficulty answering questions to complete structured language task, benefits from usual repetition and phonemic cues to describe requested feature. Pt with increasing frustration re: challenges in correcting motor speech so activity ended. SLP facilitated list generation of preferred authors using visual cues. Spontaneous use of white board feature x2 when communication breakdown occurs.   03-03-23: Continued to target verbal apraxia utilizing multimodal communication, including writing on SGD, gestures, and speech. Given consistent max cues and models, pt with 60% accuracy in targeted speech productions at word and phrase level. SLP reiterated importance of speaking slowly and to repeat a word at least once more when she produces it correctly to reinforce the proper motor movement. SLP facilitated discussion on favorite media and provided scaffolding to support conversation on TV shows (The Resident, [place]) and books via answering "wh" questions (who, what, when, where). Pt endorsed this was helpful and plans to practice at home. Pt continues to benefit from written and spoken models and faded choral speech.   02-26-23: Targeted verba apraxia repeating phrases with carrier phrase, increasing length to 5 words with 60% accuracy and consistent slow rate, choral speech fading to placement cues. Targeted placing order, repeating her order "cuban reuben" with choral fading to placement, increasing length the carrier phrase "I'll have a....Marland Kitchen" for  cuban, truffle fries and water with 80% accuracy. Repetition of multisyllabic words re: the next vacation she hopes to take with 80% accuracy at word level, 60% accuracy at phrase level with  fading choral speech. Added viking cruise and [place] to  SGD  02-24-23: Al and son [name] present. Targeted verbal apraxia using social and personally relevant phrases with fading placement cues. Ellieanna repeated 8 phrases/conversation questions specific to family members 8/10 intelligible. Targeted verbal apraxia repeating 3-4 syllables words, 80% intelligible, then generating simple sentences with carrier phrase using 3 -4 syllable word with fading choral, placement cues with 70% accuracy. With family assistance, Skylia did shop on [employer], selected and purchased new pajamas. They plan to attend aphasia group tomorrow   02-19-23: Target participation in conversational speech with employment of multimodal communication and apraxia strategies. Pt takes 4+ turns in 2 conversations with use of processing strategies and verbal cues to use SGD to support communication breakdowns d/t apraxia errors. Pt demonstrating proficient navigation of device once given cues to use in conversation. Encouraged Al to refer to device when he and son are unsure of Chelsea's communicative intent. Ongoing education on incorporating principles of motor learning into home practice to facilitate improvement re: apraxia of speech.  PATIENT EDUCATION: Education details: Sees today's treatment and patient instructions, aphasia ed Person educated: Patient, Spouse, and Child(ren) Education method: Explanation, Demonstration, Verbal cues, and Handouts Education comprehension: verbalized understanding, verbal cues required, and needs further education   GOALS: Goals reviewed with patient? Yes  SHORT TERM GOALS: Target date: 12/25/22  Pt will name 5 family members with occasional mod A Baseline: Goal status: MET  2.  Pt will answer personally relevant yes/no questions accurately 85% with multimodal communication Baseline:  Goal status: MET  3.  Pt will ID 5 food and shopping preferences using multimodal communication with occasional min A Baseline:  Goal status: MET  4.  Pt will name 6 items  in personally relevant categories using AAC/multimodal communication as needed with occasional mod A Baseline:  Goal status: NOT MET  5.  Family/caregivers will carryover 3 communication strategies to support pt's comprehension and expression Baseline:  Goal status: MET   LONG TERM GOALS: Target date: 04/14/23  Pt will initiate use mulitmodal communication to communicate wants/needs 3x over 1 week with usual min A from caregivers Baseline:  Goal status: MET  2.  Pt or caregivers will customize 4 items on AAC system with rare min A  Baseline:  Goal status: MET  3.  Pt will use multimodal communication to communicate 3 grocery items and 3 personal care items she needs with occasional min A from family Baseline:  Goal status: MET  4.  Pt will access online shopping site she uses and purchase a needed item with occasional  min A from family Baseline: ongoing goal for re-cert [id number] Goal status: ONGOING   5.  Using compensatory strategies and multimodal communication, pt will take 3turns in conversation with occasional min A Baseline:  Goal status: MET  6. Pt will produce 3-5 word conversational phrases with frequent mod A in structured task Baseline: new goal for re-cert [id number] Goal Status: ONGOING  7. Pt will order 1-2 items dining out using multimodal communication over 3 restaurant meals with occasional min A from family             Baseline: new goal for recert [id number]             Goal Status: New  8. Ellaine will  use appropriate conversational phrases over phone calls with her family 1-2x over 4 phone calls or face time with occasional min a from family                Baseline: new goal for re-cert 0000000                Goal Status: New       ASSESSMENT:  CLINICAL IMPRESSION: Mekalah has demonstrated good progress - she is using SGD with mod I, writing at word level to augment verbal communication. Severe verbal apraxia persists, however Asya has progressed from producing 1  word utterances in structured tasks to producing 3 word utterances and social phrases with occasional min A. She continues to require max A to carryover these phrases into spontaneous conversation. Aphasia has improved from severe to moderate. She remains frustrated with her difficulty communicating.  At this time, short term and long term goals met - Maleaha remains motivated and practices daily, carrying over all homework, using aphasia apps to practice. I recommend she Continue skilled ST services to maximize verbal and non verbal communication for QOL, to reduce caregiver burden and life participation.   OBJECTIVE IMPAIRMENTS: include attention, memory, aphasia, and apraxia. These impairments are limiting patient from return to work, managing medications, managing appointments, managing finances, household responsibilities, ADLs/IADLs, and effectively communicating at home and in community. Factors affecting potential to achieve goals and functional outcome are severity of impairments. Patient will benefit from skilled SLP services to address above impairments and improve overall function.  REHAB POTENTIAL: Good  PLAN:  SLP FREQUENCY: 2x/week  SLP DURATION: 12 weeks  PLANNED INTERVENTIONS: Language facilitation, Environmental controls, Cueing hierachy, Cognitive reorganization, Internal/external aids, Functional tasks, Multimodal communication approach, SLP instruction and feedback, Compensatory strategies, and Patient/family education   Geetha, Hornick, CCC-SLP 03/12/2023, 3:26 PM

## 2023-03-17 ENCOUNTER — Ambulatory Visit: Payer: Medicare Other | Admitting: Speech Pathology

## 2023-03-17 DIAGNOSIS — R4701 Aphasia: Secondary | ICD-10-CM

## 2023-03-17 DIAGNOSIS — R482 Apraxia: Secondary | ICD-10-CM

## 2023-03-17 NOTE — Therapy (Signed)
OUTPATIENT SPEECH LANGUAGE PATHOLOGY TREATMENT   Patient Name: Kelsey Jacobs MRN: [id number] DOB:Oct 08, 1946, 77 y.o., female Today's Date: 03/17/2023  PCP: Alain Honey MD REFERRING PROVIDER: Basilia Jumbo, FNP  END OF SESSION:  End of Session - 03/17/23 1226     Visit Number 28    Number of Visits 53    Date for SLP Re-Evaluation 04/14/23    Authorization Type medicare    SLP Start Time 1230    SLP Stop Time  1315    SLP Time Calculation (min) 45 min    Activity Tolerance Other (comment)   Treatment limited by pt frustration              Past Medical History:  Diagnosis Date   Medical history non-contributory    Past Surgical History:  Procedure Laterality Date   BACK SURGERY     FOOT SURGERY     INCISION AND DRAINAGE ABSCESS Left 03/29/2021   Procedure: INCISION AND DRAINAGE AXILLARY ABSCESS;  Surgeon: Griselda Miner, MD;  Location: [facility];  Service: General;  Laterality: Left;   Patient Active Problem List   Diagnosis Date Noted   Axillary abscess 03/30/2021    ONSET DATE: 11/06/22   REFERRING DIAG: M63.817 (ICD-10-CM) - Arterial ischemic stroke, MCA (middle cerebral artery), left, acute ([facility]) Z74.09 (ICD-10-CM) - Other reduced mobility Z78.9 (ICD-10-CM) - Other specified health status I69.320 (ICD-10-CM) - Aphasia following cerebral infarction  THERAPY DIAG:  Aphasia  Verbal apraxia  Rationale for Evaluation and Treatment: Rehabilitation  SUBJECTIVE:   SUBJECTIVE STATEMENT: "I don't know why. I can't do this. I'm sorry."  PAIN:  Are you having pain? No  OBJECTIVE:   TODAY'S TREATMENT:   03-17-23: Pt's family reported that she is struggling to text on her phone. She stated that is difficult to reply to spontaneous messages. She has been consistently practicing her word list at home and has been struggling with it. Pt became increasingly stressed and frustrated across session, reducing her speech intelligibility during conversation and a  structured speech task. Pt intermittently benefited from models, although with less than 50% accuracy this date secondary to frustration. Pt was rushing her speech and struggling to communicate.   HEP: Bring phone next time so we can work on texting and planning messages and typing them out on the keyboard on her SGD.   03-12-23: Targeted verbal apraxia in everyday sentences with carrier phrases of increasing length. I eat at..., I watch, ,I go to, I shop at, With frequent mod to max A, (fading choral cues) Terrica repeated 15 phrases with 65% intelligibility. Sentence length 4-7 words. In conversation, Rama used AAC/multimodal communication to take 4 turns. She asked 2 questions with AAC. Ongoing frustration required cues to redirect.    03-04-23: Attempt phrase level speech during structured language task, modified semantic feature analysis. Pt requiring max cues this date, overall accuracy 40% at 3 word phrase level. Pt with difficulty answering questions to complete structured language task, benefits from usual repetition and phonemic cues to describe requested feature. Pt with increasing frustration re: challenges in correcting motor speech so activity ended. SLP facilitated list generation of preferred authors using visual cues. Spontaneous use of white board feature x2 when communication breakdown occurs.   03-03-23: Continued to target verbal apraxia utilizing multimodal communication, including writing on SGD, gestures, and speech. Given consistent max cues and models, pt with 60% accuracy in targeted speech productions at word and phrase level. SLP reiterated importance of speaking slowly and  to repeat a word at least once more when she produces it correctly to reinforce the proper motor movement. SLP facilitated discussion on favorite media and provided scaffolding to support conversation on TV shows ([place], [place]) and books via answering "wh" questions (who, what, when, where). Pt endorsed  this was helpful and plans to practice at home. Pt continues to benefit from written and spoken models and faded choral speech.   02-26-23: Targeted verba apraxia repeating phrases with carrier phrase, increasing length to 5 words with 60% accuracy and consistent slow rate, choral speech fading to placement cues. Targeted placing order, repeating her order "cuban reuben" with choral fading to placement, increasing length the carrier phrase "I'll have a....Marland Kitchen." for cuban, truffle fries and water with 80% accuracy. Repetition of multisyllabic words re: the next vacation she hopes to take with 80% accuracy at word level, 60% accuracy at phrase level with  fading choral speech. Added viking cruise and [place] to SGD  PATIENT EDUCATION: Education details: Sees today's treatment and patient instructions, aphasia ed Person educated: Patient, Spouse, and Child(ren) Education method: Explanation, Demonstration, Verbal cues, and Handouts Education comprehension: verbalized understanding, verbal cues required, and needs further education   GOALS: Goals reviewed with patient? Yes  SHORT TERM GOALS: Target date: 12/25/22  Pt will name 5 family members with occasional mod A Baseline: Goal status: MET  2.  Pt will answer personally relevant yes/no questions accurately 85% with multimodal communication Baseline:  Goal status: MET  3.  Pt will ID 5 food and shopping preferences using multimodal communication with occasional min A Baseline:  Goal status: MET  4.  Pt will name 6 items in personally relevant categories using AAC/multimodal communication as needed with occasional mod A Baseline:  Goal status: NOT MET  5.  Family/caregivers will carryover 3 communication strategies to support pt's comprehension and expression Baseline:  Goal status: MET   LONG TERM GOALS: Target date: 04/14/23  Pt will initiate use mulitmodal communication to communicate wants/needs 3x over 1 week with usual min A  from caregivers Baseline:  Goal status: MET  2.  Pt or caregivers will customize 4 items on AAC system with rare min A  Baseline:  Goal status: MET  3.  Pt will use multimodal communication to communicate 3 grocery items and 3 personal care items she needs with occasional min A from family Baseline:  Goal status: MET  4.  Pt will access online shopping site she uses and purchase a needed item with occasional  min A from family Baseline: ongoing goal for re-cert 1/61/093/11/24 Goal status: ONGOING   5.  Using compensatory strategies and multimodal communication, pt will take 3turns in conversation with occasional min A Baseline:  Goal status: MET  6. Pt will produce 3-5 word conversational phrases with frequent mod A in structured task Baseline: new goal for re-cert 6/04/543/11/24 Goal Status: ONGOING  7. Pt will order 1-2 items dining out using multimodal communication over 3 restaurant meals with occasional min A from family             Baseline: new goal for recert 02/17/23             Goal Status: New  8. Dewayne Hatchnn will use appropriate conversational phrases over phone calls with her family 1-2x over 4 phone calls or face time with occasional min a from family                Baseline: new goal for re-cert 0/98/113/11/25  Goal Status: New       ASSESSMENT:  CLINICAL IMPRESSION: Koda has demonstrated good progress - she is using SGD with mod I, writing at word level to augment verbal communication. Severe verbal apraxia persists, however Kenecia has progressed from producing 1 word utterances in structured tasks to producing 3 word utterances and social phrases with occasional min A. She continues to require max A to carryover these phrases into spontaneous conversation. Aphasia has improved from severe to moderate. She remains frustrated with her difficulty communicating.  At this time, short term and long term goals met - Chloie remains motivated and practices daily, carrying over all homework, using  aphasia apps to practice. I recommend she Continue skilled ST services to maximize verbal and non verbal communication for QOL, to reduce caregiver burden and life participation.   OBJECTIVE IMPAIRMENTS: include attention, memory, aphasia, and apraxia. These impairments are limiting patient from return to work, managing medications, managing appointments, managing finances, household responsibilities, ADLs/IADLs, and effectively communicating at home and in community. Factors affecting potential to achieve goals and functional outcome are severity of impairments. Patient will benefit from skilled SLP services to address above impairments and improve overall function.  REHAB POTENTIAL: Good  PLAN:  SLP FREQUENCY: 2x/week  SLP DURATION: 12 weeks  PLANNED INTERVENTIONS: Language facilitation, Environmental controls, Cueing hierachy, Cognitive reorganization, Internal/external aids, Functional tasks, Multimodal communication approach, SLP instruction and feedback, Compensatory strategies, and Patient/family education   Tisha, Stolar, CCC-SLP 03/17/2023, 3:43 PM

## 2023-03-19 ENCOUNTER — Ambulatory Visit: Payer: Medicare Other

## 2023-03-19 DIAGNOSIS — R4701 Aphasia: Secondary | ICD-10-CM

## 2023-03-19 DIAGNOSIS — R482 Apraxia: Secondary | ICD-10-CM

## 2023-03-19 NOTE — Therapy (Signed)
OUTPATIENT SPEECH LANGUAGE PATHOLOGY TREATMENT   Patient Name: Kelsey Jacobs MRN: [id number] DOB:[birth year], 77 y.o., female Today's Date: 03/19/2023  PCP: Kelsey Honey MD REFERRING PROVIDER: Basilia Jumbo, FNP  END OF SESSION:  End of Session - 03/19/23 1322     Visit Number 29    Number of Visits 53    Date for SLP Re-Evaluation 04/14/23    Authorization Type medicare    SLP Start Time 1230    SLP Stop Time  1316    SLP Time Calculation (min) 46 min    Activity Tolerance Patient tolerated treatment well                Past Medical History:  Diagnosis Date   Medical history non-contributory    Past Surgical History:  Procedure Laterality Date   BACK SURGERY     FOOT SURGERY     INCISION AND DRAINAGE ABSCESS Left 03/29/2021   Procedure: INCISION AND DRAINAGE AXILLARY ABSCESS;  Surgeon: Griselda Miner, MD;  Location: [facility];  Service: General;  Laterality: Left;   Patient Active Problem List   Diagnosis Date Noted   Axillary abscess 03/30/2021    ONSET DATE: 11/06/22   REFERRING DIAG: T61.443 (ICD-10-CM) - Arterial ischemic stroke, MCA (middle cerebral artery), left, acute (HCC) Z74.09 (ICD-10-CM) - Other reduced mobility Z78.9 (ICD-10-CM) - Other specified health status I69.320 (ICD-10-CM) - Aphasia following cerebral infarction  THERAPY DIAG:  Aphasia  Verbal apraxia  Rationale for Evaluation and Treatment: Rehabilitation  SUBJECTIVE:   SUBJECTIVE STATEMENT: "hard"  PAIN:  Are you having pain? No  OBJECTIVE:   TODAY'S TREATMENT:   03-19-23: Targeted personally relevant goal for texting/typing. Son reported pt has difficulty responding to texts, which is frustrating for patient. Targeted typing on SGD as phone not provided today. Usual fading to rare perseverations exhibited ("guess who") with some awareness of error. Spelling aloud was intermittent strength to reduce perseverations. Used scaffolding cues from copying fading to first letter  at word level. Provided handout as reference for HEP. Requested pt bring phone next session to assess and address functional carryover.  03-17-23: Pt's family reported that she is struggling to text on her phone. She stated that is difficult to reply to spontaneous messages. She has been consistently practicing her word list at home and has been struggling with it. Pt became increasingly stressed and frustrated across session, reducing her speech intelligibility during conversation and a structured speech task. Pt intermittently benefited from models, although with less than 50% accuracy this date secondary to frustration. Pt was rushing her speech and struggling to communicate.   HEP: Bring phone next time so we can work on texting and planning messages and typing them out on the keyboard on her SGD.   03-12-23: Targeted verbal apraxia in everyday sentences with carrier phrases of increasing length. I eat at..., I watch, ,I go to, I shop at, With frequent mod to max A, (fading choral cues) [name] repeated 15 phrases with 65% intelligibility. Sentence length 4-7 words. In conversation, [name] used AAC/multimodal communication to take 4 turns. She asked 2 questions with AAC. Ongoing frustration required cues to redirect.    03-04-23: Attempt phrase level speech during structured language task, modified semantic feature analysis. Pt requiring max cues this date, overall accuracy 40% at 3 word phrase level. Pt with difficulty answering questions to complete structured language task, benefits from usual repetition and phonemic cues to describe requested feature. Pt with increasing frustration re: challenges in correcting motor  speech so activity ended. SLP facilitated list generation of preferred authors using visual cues. Spontaneous use of white board feature x2 when communication breakdown occurs.   03-03-23: Continued to target verbal apraxia utilizing multimodal communication, including writing on SGD, gestures, and  speech. Given consistent max cues and models, pt with 60% accuracy in targeted speech productions at word and phrase level. SLP reiterated importance of speaking slowly and to repeat a word at least once more when she produces it correctly to reinforce the proper motor movement. SLP facilitated discussion on favorite media and provided scaffolding to support conversation on TV shows (The Resident, [place]) and books via answering "wh" questions (who, what, when, where). Pt endorsed this was helpful and plans to practice at home. Pt continues to benefit from written and spoken models and faded choral speech.   02-26-23: Targeted verba apraxia repeating phrases with carrier phrase, increasing length to 5 words with 60% accuracy and consistent slow rate, choral speech fading to placement cues. Targeted placing order, repeating her order "cuban reuben" with choral fading to placement, increasing length the carrier phrase "I'll have a....[employer]" for cuban, truffle fries and water with 80% accuracy. Repetition of multisyllabic words re: the next vacation she hopes to take with 80% accuracy at word level, 60% accuracy at phrase level with  fading choral speech. Added viking cruise and [place] to SGD  PATIENT EDUCATION: Education details: Sees today's treatment and patient instructions, aphasia ed Person educated: Patient, Spouse, and Child(ren) Education method: Explanation, Demonstration, Verbal cues, and Handouts Education comprehension: verbalized understanding, verbal cues required, and needs further education   GOALS: Goals reviewed with patient? Yes  SHORT TERM GOALS: Target date: 12/25/22  Pt will name 5 family members with occasional mod A Baseline: Goal status: MET  2.  Pt will answer personally relevant yes/no questions accurately 85% with multimodal communication Baseline:  Goal status: MET  3.  Pt will ID 5 food and shopping preferences using multimodal communication with occasional  min A Baseline:  Goal status: MET  4.  Pt will name 6 items in personally relevant categories using AAC/multimodal communication as needed with occasional mod A Baseline:  Goal status: NOT MET  5.  Family/caregivers will carryover 3 communication strategies to support pt's comprehension and expression Baseline:  Goal status: MET   LONG TERM GOALS: Target date: 04/14/23  Pt will initiate use mulitmodal communication to communicate wants/needs 3x over 1 week with usual min A from caregivers Baseline:  Goal status: MET  2.  Pt or caregivers will customize 4 items on AAC system with rare min A  Baseline:  Goal status: MET  3.  Pt will use multimodal communication to communicate 3 grocery items and 3 personal care items she needs with occasional min A from family Baseline:  Goal status: MET  4.  Pt will access online shopping site she uses and purchase a needed item with occasional  min A from family Baseline: ongoing goal for re-cert 1/44/31 Goal status: ONGOING   5.  Using compensatory strategies and multimodal communication, pt will take 3turns in conversation with occasional min A Baseline:  Goal status: MET  6. Pt will produce 3-5 word conversational phrases with frequent mod A in structured task Baseline: new goal for re-cert 5/40/08 Goal Status: ONGOING  7. Pt will order 1-2 items dining out using multimodal communication over 3 restaurant meals with occasional min A from family             Baseline:  new goal for recert 02/17/23             Goal Status: ONGOING  8. Kelsey Jacobs will use appropriate conversational phrases over phone calls with her family 1-2x over 4 phone calls or face time with occasional min a from family                Baseline: new goal for re-cert 1/61/093/11/25                Goal Status: ONGOING       ASSESSMENT:  CLINICAL IMPRESSION: Kelsey Jacobs has demonstrated good progress - she is using SGD with mod I, writing at word level to augment verbal communication. Severe  verbal apraxia persists, however Kelsey Jacobs has progressed from producing 1 word utterances in structured tasks to producing 3 word utterances and social phrases with occasional min A. She continues to require max A to carryover these phrases into spontaneous conversation. Aphasia has improved from severe to moderate. She remains frustrated with her difficulty communicating. Kelsey Jacobs remains motivated and practices daily, carrying over all homework, using aphasia apps to practice. I recommend she Continue skilled ST services to maximize verbal and non verbal communication for QOL, to reduce caregiver burden and life participation.   OBJECTIVE IMPAIRMENTS: include attention, memory, aphasia, and apraxia. These impairments are limiting patient from return to work, managing medications, managing appointments, managing finances, household responsibilities, ADLs/IADLs, and effectively communicating at home and in community. Factors affecting potential to achieve goals and functional outcome are severity of impairments. Patient will benefit from skilled SLP services to address above impairments and improve overall function.  REHAB POTENTIAL: Good  PLAN:  SLP FREQUENCY: 2x/week  SLP DURATION: 12 weeks  PLANNED INTERVENTIONS: Language facilitation, Environmental controls, Cueing hierachy, Cognitive reorganization, Internal/external aids, Functional tasks, Multimodal communication approach, SLP instruction and feedback, Compensatory strategies, and Patient/family education   Gracy RacerKatherine I Johnson, CCC-SLP 03/19/2023, 1:23 PM

## 2023-03-19 NOTE — Patient Instructions (Signed)
Practice typing common texting responses on your device and/or phone. Your family can help you identify things you would have written before. They can write them down and you will type them twice.   Examples:  How are you? Good.  Not so good.  Thank you.  Ginny  Try to spell things out first. You were successful with this! It was also helpful to give you the first letter and you were able to complete it!   Family can also give you a word and you type it.  Ex: where do you like to shop? What is your son's name?

## 2023-03-26 ENCOUNTER — Ambulatory Visit: Payer: Medicare Other | Admitting: Speech Pathology

## 2023-03-26 DIAGNOSIS — R4701 Aphasia: Secondary | ICD-10-CM

## 2023-03-26 DIAGNOSIS — R482 Apraxia: Secondary | ICD-10-CM

## 2023-03-26 NOTE — Therapy (Addendum)
OUTPATIENT SPEECH LANGUAGE PATHOLOGY TREATMENT   Patient Name: Kelsey Jacobs MRN: [id number] DOB:February 22, 1946, 77 y.o., female Today's Date: 03/26/2023  PCP: Alain Honey MD REFERRING PROVIDER: Basilia Jumbo, FNP  END OF SESSION:  End of Session - 03/26/23 1223     Visit Number 30    Number of Visits 53    Date for SLP Re-Evaluation 04/14/23    Authorization Type medicare    SLP Start Time 1230    SLP Stop Time  1215    SLP Time Calculation (min) 1425 min    Activity Tolerance Patient tolerated treatment well                Past Medical History:  Diagnosis Date   Medical history non-contributory    Past Surgical History:  Procedure Laterality Date   BACK SURGERY     FOOT SURGERY     INCISION AND DRAINAGE ABSCESS Left 03/29/2021   Procedure: INCISION AND DRAINAGE AXILLARY ABSCESS;  Surgeon: Griselda Miner, MD;  Location: [facility];  Service: General;  Laterality: Left;   Patient Active Problem List   Diagnosis Date Noted   Axillary abscess 03/30/2021    ONSET DATE: 11/06/22   REFERRING DIAG: O96.295 (ICD-10-CM) - Arterial ischemic stroke, MCA (middle cerebral artery), left, acute (HCC) Z74.09 (ICD-10-CM) - Other reduced mobility Z78.9 (ICD-10-CM) - Other specified health status I69.320 (ICD-10-CM) - Aphasia following cerebral infarction  THERAPY DIAG:  Aphasia  Verbal apraxia  Rationale for Evaluation and Treatment: Rehabilitation  SUBJECTIVE:   SUBJECTIVE STATEMENT: "good"  PAIN:  Are you having pain? Yes: Pain location: neck  OBJECTIVE:   TODAY'S TREATMENT:   03-26-23: Continued to target texting/typing using script training. Pt used SGD to type, as she did not bring phone. Given written model, pt wrote out common text message she may receive and a response. Pt occasionally required gestural cue to locate letters on the keyboard and typed message out with 90% accuracy. Clinician then went over script again, this time with giving pt options to  choose from for her response, but no written model provided. Pt able to type coherent response in 5/5 opportunities given occasional min-A. Pt required encouragement because she often became frustrated during task.   03-19-23: Targeted personally relevant goal for texting/typing. Son reported pt has difficulty responding to texts, which is frustrating for patient. Targeted typing on SGD as phone not provided today. Usual fading to rare perseverations exhibited ("guess who") with some awareness of error. Spelling aloud was intermittent strength to reduce perseverations. Used scaffolding cues from copying fading to first letter at word level. Provided handout as reference for HEP. Requested pt bring phone next session to assess and address functional carryover.  03-17-23: Pt's family reported that she is struggling to text on her phone. She stated that is difficult to reply to spontaneous messages. She has been consistently practicing her word list at home and has been struggling with it. Pt became increasingly stressed and frustrated across session, reducing her speech intelligibility during conversation and a structured speech task. Pt intermittently benefited from models, although with less than 50% accuracy this date secondary to frustration. Pt was rushing her speech and struggling to communicate.   HEP: Bring phone next time so we can work on texting and planning messages and typing them out on the keyboard on her SGD.   03-12-23: Targeted verbal apraxia in everyday sentences with carrier phrases of increasing length. I eat at..., I watch, ,I go to, I shop at,  With frequent mod to max A, (fading choral cues) Modesty repeated 15 phrases with 65% intelligibility. Sentence length 4-7 words. In conversation, Kelsey Jacobs used AAC/multimodal communication to take 4 turns. She asked 2 questions with AAC. Ongoing frustration required cues to redirect.    03-04-23: Attempt phrase level speech during structured language task,  modified semantic feature analysis. Pt requiring max cues this date, overall accuracy 40% at 3 word phrase level. Pt with difficulty answering questions to complete structured language task, benefits from usual repetition and phonemic cues to describe requested feature. Pt with increasing frustration re: challenges in correcting motor speech so activity ended. SLP facilitated list generation of preferred authors using visual cues. Spontaneous use of white board feature x2 when communication breakdown occurs.   03-03-23: Continued to target verbal apraxia utilizing multimodal communication, including writing on SGD, gestures, and speech. Given consistent max cues and models, pt with 60% accuracy in targeted speech productions at word and phrase level. SLP reiterated importance of speaking slowly and to repeat a word at least once more when she produces it correctly to reinforce the proper motor movement. SLP facilitated discussion on favorite media and provided scaffolding to support conversation on TV shows ([place], [place]) and books via answering "wh" questions (who, what, when, where). Pt endorsed this was helpful and plans to practice at home. Pt continues to benefit from written and spoken models and faded choral speech.   PATIENT EDUCATION: Education details: Sees today's treatment and patient instructions, aphasia ed Person educated: Patient, Spouse, and Child(ren) Education method: Explanation, Demonstration, Verbal cues, and Handouts Education comprehension: verbalized understanding, verbal cues required, and needs further education   GOALS: Goals reviewed with patient? Yes  SHORT TERM GOALS: Target date: 12/25/22  Pt will name 5 family members with occasional mod A Baseline: Goal status: MET  2.  Pt will answer personally relevant yes/no questions accurately 85% with multimodal communication Baseline:  Goal status: MET  3.  Pt will ID 5 food and shopping preferences using  multimodal communication with occasional min A Baseline:  Goal status: MET  4.  Pt will name 6 items in personally relevant categories using AAC/multimodal communication as needed with occasional mod A Baseline:  Goal status: NOT MET  5.  Family/caregivers will carryover 3 communication strategies to support pt's comprehension and expression Baseline:  Goal status: MET   LONG TERM GOALS: Target date: 04/14/23  Pt will initiate use mulitmodal communication to communicate wants/needs 3x over 1 week with usual min A from caregivers Baseline:  Goal status: MET  2.  Pt or caregivers will customize 4 items on AAC system with rare min A  Baseline:  Goal status: MET  3.  Pt will use multimodal communication to communicate 3 grocery items and 3 personal care items she needs with occasional min A from family Baseline:  Goal status: MET  4.  Pt will access online shopping site she uses and purchase a needed item with occasional  min A from family Baseline: ongoing goal for re-cert 8/65/78 Goal status: ONGOING   5.  Using compensatory strategies and multimodal communication, pt will take 3turns in conversation with occasional min A Baseline:  Goal status: MET  6. Pt will produce 3-5 word conversational phrases with frequent mod A in structured task Baseline: new goal for re-cert 4/69/62 Goal Status: ONGOING  7. Pt will order 1-2 items dining out using multimodal communication over 3 restaurant meals with occasional min A from family  Baseline: new goal for recert 02/17/23             Goal Status: ONGOING  8. Kelsey Jacobs will use appropriate conversational phrases over phone calls with her family 1-2x over 4 phone calls or face time with occasional min a from family                Baseline: new goal for re-cert 1/61/09                Goal Status: ONGOING       ASSESSMENT:  CLINICAL IMPRESSION: Kelsey Jacobs has demonstrated good progress - she is using SGD with mod I, writing at word level  to augment verbal communication. Severe verbal apraxia persists, however Kelsey Jacobs has progressed from producing 1 word utterances in structured tasks to producing 3 word utterances and social phrases with occasional min A. She continues to require max A to carryover these phrases into spontaneous conversation. Aphasia has improved from severe to moderate. She remains frustrated with her difficulty communicating. Kelsey Jacobs remains motivated and practices daily, carrying over all homework, using aphasia apps to practice. I recommend she Continue skilled ST services to maximize verbal and non verbal communication for QOL, to reduce caregiver burden and life participation.   OBJECTIVE IMPAIRMENTS: include attention, memory, aphasia, and apraxia. These impairments are limiting patient from return to work, managing medications, managing appointments, managing finances, household responsibilities, ADLs/IADLs, and effectively communicating at home and in community. Factors affecting potential to achieve goals and functional outcome are severity of impairments. Patient will benefit from skilled SLP services to address above impairments and improve overall function.  REHAB POTENTIAL: Good  PLAN:  SLP FREQUENCY: 2x/week  SLP DURATION: 12 weeks  PLANNED INTERVENTIONS: Language facilitation, Environmental controls, Cueing hierachy, Cognitive reorganization, Internal/external aids, Functional tasks, Multimodal communication approach, SLP instruction and feedback, Compensatory strategies, and Patient/family education  Speech Therapy Progress Note  Dates of Reporting Period: 02/17/2023 to 03/26/2023  Objective Reports of Subjective Statement: Pt seen for 10 visits since last PN, 30 visits total, targeting expressive and receptive language, apraxia rehabilitation and compensation use, use of speech generating device and other modes of multimodal communication to support spoken communication, and caregiver training and  education in communication strategies. Pt and family members support improved communicative abilities at home with ongoing HEP completion. Pt is motivated to continue working on speech and language goals.   Objective Measurements: Pt with mild improvements appreciated in motor speech and structured language tasks. Moderate improvements appreciated in employment of multi-modal communication.   Goal Update: see GOALS section  Plan: continue per POC  Reason Skilled Services are Required: rehabilitation of communicative abilities to enable pt to express intended messages across communication partners   Cephus Shelling, Student-SLP 03/26/2023, 1:24 PM

## 2023-03-28 ENCOUNTER — Ambulatory Visit: Payer: Medicare Other

## 2023-03-28 DIAGNOSIS — R4701 Aphasia: Secondary | ICD-10-CM | POA: Diagnosis not present

## 2023-03-28 DIAGNOSIS — R482 Apraxia: Secondary | ICD-10-CM

## 2023-03-28 NOTE — Therapy (Addendum)
OUTPATIENT SPEECH LANGUAGE PATHOLOGY TREATMENT   Patient Name: Kelsey Jacobs MRN: [id number] DOB:[birth year], 77 y.o., female Today's Date: 03/28/2023  PCP: Alain Honey MD REFERRING PROVIDER: Basilia Jumbo, FNP  END OF SESSION:  End of Session - 03/28/23 1226     Visit Number 31    Number of Visits 53    Date for SLP Re-Evaluation 04/14/23    Authorization Type medicare    SLP Start Time 1229    SLP Stop Time  1315    SLP Time Calculation (min) 46 min    Activity Tolerance Patient tolerated treatment well                 Past Medical History:  Diagnosis Date   Medical history non-contributory    Past Surgical History:  Procedure Laterality Date   BACK SURGERY     FOOT SURGERY     INCISION AND DRAINAGE ABSCESS Left 03/29/2021   Procedure: INCISION AND DRAINAGE AXILLARY ABSCESS;  Surgeon: Griselda Miner, MD;  Location: [facility];  Service: General;  Laterality: Left;   Patient Active Problem List   Diagnosis Date Noted   Axillary abscess 03/30/2021    ONSET DATE: 11/06/22   REFERRING DIAG: N82.956 (ICD-10-CM) - Arterial ischemic stroke, MCA (middle cerebral artery), left, acute (HCC) Z74.09 (ICD-10-CM) - Other reduced mobility Z78.9 (ICD-10-CM) - Other specified health status 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 Yes: Pain location: neck  OBJECTIVE:   TODAY'S TREATMENT:   03-28-23: Returned with phone per request. Frustration exhibited and reported for typing. Modified screen to be horizontal to optimize typing ability. Targeted response to simple questions. Fixated on reading question versus answering question. Usual repetition and clarification required to aid response formation due to perseverations. Pt benefited from intermittent mod visual  and usual max verbal cues to aid writing and verbal expression at word level. Targeted functional writing to send email to therapist. Min A required for content words and max A required for grammatical words.   03-26-23: Continued to target texting/typing using script training. Pt used SGD to type, as she did not bring phone. Given written model, pt wrote out common text message she may receive and a response. Pt occasionally required gestural cue to locate letters on the keyboard and typed message out with 90% accuracy. Clinician then went over script again, this time with giving pt options to choose from for her response, but no written model provided. Pt able to type coherent response in 5/5 opportunities given occasional min-A. Pt required encouragement because she often became frustrated during task.   03-19-23: Targeted personally relevant goal for texting/typing. Son reported pt has difficulty responding to texts, which is frustrating for patient. Targeted typing on SGD as phone not provided today. Usual fading to rare perseverations exhibited ("guess who") with some awareness of error. Spelling aloud was intermittent strength to reduce perseverations. Used scaffolding cues from copying fading to first letter at word level. Provided handout as reference for HEP. Requested pt bring phone next session to assess and address functional carryover.  03-17-23: Pt's family reported that she is struggling to text on her phone. She stated that is difficult to reply to spontaneous messages. She has been consistently practicing her word list at home and has been struggling with it. Pt became increasingly stressed and  frustrated across session, reducing her speech intelligibility during conversation and a structured speech task. Pt intermittently benefited from models, although with less than 50% accuracy this date secondary to frustration. Pt was rushing her speech and struggling to communicate.   HEP: Bring phone  next time so we can work on texting and planning messages and typing them out on the keyboard on her SGD.   03-12-23: Targeted verbal apraxia in everyday sentences with carrier phrases of increasing length. I eat at..., I watch, ,I go to, I shop at, With frequent mod to max A, (fading choral cues) Quincee repeated 15 phrases with 65% intelligibility. Sentence length 4-7 words. In conversation, Merion used AAC/multimodal communication to take 4 turns. She asked 2 questions with AAC. Ongoing frustration required cues to redirect.    03-04-23: Attempt phrase level speech during structured language task, modified semantic feature analysis. Pt requiring max cues this date, overall accuracy 40% at 3 word phrase level. Pt with difficulty answering questions to complete structured language task, benefits from usual repetition and phonemic cues to describe requested feature. Pt with increasing frustration re: challenges in correcting motor speech so activity ended. SLP facilitated list generation of preferred authors using visual cues. Spontaneous use of white board feature x2 when communication breakdown occurs.   03-03-23: Continued to target verbal apraxia utilizing multimodal communication, including writing on SGD, gestures, and speech. Given consistent max cues and models, pt with 60% accuracy in targeted speech productions at word and phrase level. SLP reiterated importance of speaking slowly and to repeat a word at least once more when she produces it correctly to reinforce the proper motor movement. SLP facilitated discussion on favorite media and provided scaffolding to support conversation on TV shows ([employer] [place]ident, [place]) and books via answering "wh" questions (who, what, when, where). Pt endorsed this was helpful and plans to practice at home. Pt continues to benefit from written and spoken models and faded choral speech.   PATIENT EDUCATION: Education details: Sees today's treatment and patient  instructions, aphasia ed Person educated: Patient, Spouse, and Child(ren) Education method: Explanation, Demonstration, Verbal cues, and Handouts Education comprehension: verbalized understanding, verbal cues required, and needs further education   GOALS: Goals reviewed with patient? Yes  SHORT TERM GOALS: Target date: 12/25/22  Pt will name 5 family members with occasional mod A Baseline: Goal status: MET  2.  Pt will answer personally relevant yes/no questions accurately 85% with multimodal communication Baseline:  Goal status: MET  3.  Pt will ID 5 food and shopping preferences using multimodal communication with occasional min A Baseline:  Goal status: MET  4.  Pt will name 6 items in personally relevant categories using AAC/multimodal communication as needed with occasional mod A Baseline:  Goal status: NOT MET  5.  Family/caregivers will carryover 3 communication strategies to support pt's comprehension and expression Baseline:  Goal status: MET   LONG TERM GOALS: Target date: 04/14/23  Pt will initiate use mulitmodal communication to communicate wants/needs 3x over 1 week with usual min A from caregivers Baseline:  Goal status: MET  2.  Pt or caregivers will customize 4 items on AAC system with rare min A  Baseline:  Goal status: MET  3.  Pt will use multimodal communication to communicate 3 grocery items and 3 personal care items she needs with occasional min A from family Baseline:  Goal status: MET  4.  Pt will access online shopping site she uses and purchase a needed item with occasional  min A from family Baseline: ongoing goal for re-cert 03/17/80 Goal status: ONGOING   5.  Using compensatory strategies and multimodal communication, pt will take 3turns in conversation with occasional min A Baseline:  Goal status: MET  6. Pt will produce 3-5 word conversational phrases with frequent mod A in structured task Baseline: new goal for re-cert 1/91/47 Goal  Status: ONGOING  7. Pt will order 1-2 items dining out using multimodal communication over 3 restaurant meals with occasional min A from family             Baseline: new goal for recert 02/17/23             Goal Status: ONGOING  8. Shelene will use appropriate conversational phrases over phone calls with her family 1-2x over 4 phone calls or face time with occasional min a from family                Baseline: new goal for re-cert 08/07/55                Goal Status: ONGOING       ASSESSMENT:  CLINICAL IMPRESSION: Anel has demonstrated good progress - she is using SGD with mod I, writing at word level to augment verbal communication. Severe verbal apraxia persists, however Jonita has progressed from producing 1 word utterances in structured tasks to producing 3 word utterances and social phrases with occasional min A. She continues to require max A to carryover these phrases into spontaneous conversation. Aphasia has improved from severe to moderate. She remains frustrated with her difficulty communicating. Aicha remains motivated and practices daily, carrying over all homework, using aphasia apps to practice. I recommend she Continue skilled ST services to maximize verbal and non verbal communication for QOL, to reduce caregiver burden and life participation.   OBJECTIVE IMPAIRMENTS: include attention, memory, aphasia, and apraxia. These impairments are limiting patient from return to work, managing medications, managing appointments, managing finances, household responsibilities, ADLs/IADLs, and effectively communicating at home and in community. Factors affecting potential to achieve goals and functional outcome are severity of impairments. Patient will benefit from skilled SLP services to address above impairments and improve overall function.  REHAB POTENTIAL: Good  PLAN:  SLP FREQUENCY: 2x/week  SLP DURATION: 12 weeks  PLANNED INTERVENTIONS: Language facilitation, Environmental controls, Cueing  hierachy, Cognitive reorganization, Internal/external aids, Functional tasks, Multimodal communication approach, SLP instruction and feedback, Compensatory strategies, and Patient/family education    Gracy Racer, CCC-SLP 03/28/2023, 12:26 PM

## 2023-03-31 ENCOUNTER — Encounter: Payer: Self-pay | Admitting: Speech Pathology

## 2023-03-31 ENCOUNTER — Ambulatory Visit: Payer: Medicare Other | Admitting: Speech Pathology

## 2023-03-31 DIAGNOSIS — R4701 Aphasia: Secondary | ICD-10-CM | POA: Diagnosis not present

## 2023-03-31 DIAGNOSIS — R482 Apraxia: Secondary | ICD-10-CM

## 2023-03-31 NOTE — Therapy (Signed)
OUTPATIENT SPEECH LANGUAGE PATHOLOGY TREATMENT   Patient Name: Kelsey Jacobs MRN: [id number] DOB:December 20, 1945, 77 y.o., female Today's Date: 03/31/2023  PCP: Alain Honey MD REFERRING PROVIDER: Basilia Jumbo, FNP  END OF SESSION:  End of Session - 03/31/23 1310     Visit Number 32    Number of Visits 53    Date for SLP Re-Evaluation 04/14/23    Authorization Type medicare    SLP Start Time 1315    SLP Stop Time  1400    SLP Time Calculation (min) 45 min    Activity Tolerance Patient tolerated treatment well                 Past Medical History:  Diagnosis Date   Medical history non-contributory    Past Surgical History:  Procedure Laterality Date   BACK SURGERY     FOOT SURGERY     INCISION AND DRAINAGE ABSCESS Left 03/29/2021   Procedure: INCISION AND DRAINAGE AXILLARY ABSCESS;  Surgeon: Griselda Miner, MD;  Location: [facility];  Service: General;  Laterality: Left;   Patient Active Problem List   Diagnosis Date Noted   Axillary abscess 03/30/2021    ONSET DATE: 11/06/22   REFERRING DIAG: N82.956 (ICD-10-CM) - Arterial ischemic stroke, MCA (middle cerebral artery), left, acute (HCC) Z74.09 (ICD-10-CM) - Other reduced mobility Z78.9 (ICD-10-CM) - Other specified health status 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 Yes: Pain location: neck  OBJECTIVE:   TODAY'S TREATMENT:   03-31-23: Daysi responds to questions re: shopping. She has ordered pajamas on line with A from family, and has been shopping at [facility] 2x, using multimodal communication to direct her family to the sections she wants to go to and to select clothing she wants. She is wearing a new top she bought today. Targeted answering questions re: multimodal communication with typing to improve  text communication. With usual mod A, she typed on SGD answers to recent activities and restaurants 3/5x.    To target word finding, multimodal communication, verbal apraxia and comprehension of "wh" questions  Scientist, product/process development ([employer]) was utilized. The pt generated 3 subjects and objects for 4 verbs (bake, throw, drive, wash), for a total of 12 subject objects. Pt required frequent mod to max verbal or choice of 2 cues. Pt generated 4 complex sentences by answering "wh" questions. Pt required frequent mod questioning, choice cues to generate complex sentences. With choral speech, she read the 12 subect-verb-object sentences 3x each with 70% accuracy. With choral speech and placement cues, she read 4 complex sentences twice each with 65% accuracy/approximations.    03-28-23: Returned with phone per request. Frustration exhibited and reported for typing. Modified screen to be horizontal to optimize typing ability. Targeted response to simple questions. Fixated on reading question versus answering question. Usual repetition and clarification required to aid response formation due to perseverations. Pt benefited from intermittent mod visual and usual max verbal cues to aid writing and verbal expression at word level. Targeted functional writing to send email to therapist. Min A required for content words and max A required for grammatical words.   03-26-23: Continued to target texting/typing using script training. Pt used SGD to type, as she did not bring phone. Given written model, pt wrote out common text message she may receive and a  response. Pt occasionally required gestural cue to locate letters on the keyboard and typed message out with 90% accuracy. Clinician then went over script again, this time with giving pt options to choose from for her response, but no written model provided. Pt able to type coherent response in 5/5 opportunities given occasional min-A. Pt required encouragement  because she often became frustrated during task.   03-19-23: Targeted personally relevant goal for texting/typing. Son reported pt has difficulty responding to texts, which is frustrating for patient. Targeted typing on SGD as phone not provided today. Usual fading to rare perseverations exhibited ("guess who") with some awareness of error. Spelling aloud was intermittent strength to reduce perseverations. Used scaffolding cues from copying fading to first letter at word level. Provided handout as reference for HEP. Requested pt bring phone next session to assess and address functional carryover.  03-17-23: Pt's family reported that she is struggling to text on her phone. She stated that is difficult to reply to spontaneous messages. She has been consistently practicing her word list at home and has been struggling with it. Pt became increasingly stressed and frustrated across session, reducing her speech intelligibility during conversation and a structured speech task. Pt intermittently benefited from models, although with less than 50% accuracy this date secondary to frustration. Pt was rushing her speech and struggling to communicate.   HEP: Bring phone next time so we can work on texting and planning messages and typing them out on the keyboard on her SGD.   03-12-23: Targeted verbal apraxia in everyday sentences with carrier phrases of increasing length. I eat at..., I watch, ,I go to, I shop at, With frequent mod to max A, (fading choral cues) Allien repeated 15 phrases with 65% intelligibility. Sentence length 4-7 words. In conversation, [place] used AAC/multimodal communication to take 4 turns. She asked 2 questions with AAC. Ongoing frustration required cues to redirect.    PATIENT EDUCATION: Education details: Sees today's treatment and patient instructions, aphasia ed Person educated: Patient, Spouse, and Child(ren) Education method: Explanation, Demonstration, Verbal cues, and Handouts Education  comprehension: verbalized understanding, verbal cues required, and needs further education   GOALS: Goals reviewed with patient? Yes  SHORT TERM GOALS: Target date: 12/25/22  Pt will name 5 family members with occasional mod A Baseline: Goal status: MET  2.  Pt will answer personally relevant yes/no questions accurately 85% with multimodal communication Baseline:  Goal status: MET  3.  Pt will ID 5 food and shopping preferences using multimodal communication with occasional min A Baseline:  Goal status: MET  4.  Pt will name 6 items in personally relevant categories using AAC/multimodal communication as needed with occasional mod A Baseline:  Goal status: NOT MET  5.  Family/caregivers will carryover 3 communication strategies to support pt's comprehension and expression Baseline:  Goal status: MET   LONG TERM GOALS: Target date: 04/14/23  Pt will initiate use mulitmodal communication to communicate wants/needs 3x over 1 week with usual min A from caregivers Baseline:  Goal status: MET  2.  Pt or caregivers will customize 4 items on AAC system with rare min A  Baseline:  Goal status: MET  3.  Pt will use multimodal communication to communicate 3 grocery items and 3 personal care items she needs with occasional min A from family Baseline:  Goal status: MET  4.  Pt will access online shopping site she uses and purchase a needed item with occasional  min A from family Baseline: ongoing goal for  re-cert 1/61/09 Goal status: ONGOING   5.  Using compensatory strategies and multimodal communication, pt will take 3turns in conversation with occasional min A Baseline:  Goal status: MET  6. Pt will produce 3-5 word conversational phrases with frequent mod A in structured task Baseline: new goal for re-cert 05/12/53 Goal Status: ONGOING  7. Pt will order 1-2 items dining out using multimodal communication over 3 restaurant meals with occasional min A from family              Baseline: new goal for recert 02/17/23             Goal Status: ONGOING  8. Clotilde will use appropriate conversational phrases over phone calls with her family 1-2x over 4 phone calls or face time with occasional min a from family                Baseline: new goal for re-cert 0/98/11                Goal Status: ONGOING       ASSESSMENT:  CLINICAL IMPRESSION: Shamieka has demonstrated good progress - she is using SGD with mod I, writing at word level to augment verbal communication. Severe verbal apraxia persists, however Wiley has progressed from producing 1 word utterances in structured tasks to producing 3 word utterances and social phrases with occasional min A. She continues to require max A to carryover these phrases into spontaneous conversation. Aphasia has improved from severe to moderate. She remains frustrated with her difficulty communicating. Eular remains motivated and practices daily, carrying over all homework, using aphasia apps to practice. I recommend she Continue skilled ST services to maximize verbal and non verbal communication for QOL, to reduce caregiver burden and life participation.   OBJECTIVE IMPAIRMENTS: include attention, memory, aphasia, and apraxia. These impairments are limiting patient from return to work, managing medications, managing appointments, managing finances, household responsibilities, ADLs/IADLs, and effectively communicating at home and in community. Factors affecting potential to achieve goals and functional outcome are severity of impairments. Patient will benefit from skilled SLP services to address above impairments and improve overall function.  REHAB POTENTIAL: Good  PLAN:  SLP FREQUENCY: 2x/week  SLP DURATION: 12 weeks  PLANNED INTERVENTIONS: Language facilitation, Environmental controls, Cueing hierachy, Cognitive reorganization, Internal/external aids, Functional tasks, Multimodal communication approach, SLP instruction and feedback, Compensatory  strategies, and Patient/family education    Magally, Vahle, CCC-SLP 03/31/2023, 3:45 PM

## 2023-04-02 ENCOUNTER — Ambulatory Visit: Payer: Medicare Other

## 2023-04-02 DIAGNOSIS — R482 Apraxia: Secondary | ICD-10-CM

## 2023-04-02 DIAGNOSIS — R4701 Aphasia: Secondary | ICD-10-CM | POA: Diagnosis not present

## 2023-04-02 NOTE — Therapy (Signed)
OUTPATIENT SPEECH LANGUAGE PATHOLOGY TREATMENT   Patient Name: Kelsey Jacobs MRN: [id number] DOB:1945/12/16, 77 y.o., female Today's Date: 04/02/2023  PCP: Alain Honey MD REFERRING PROVIDER: Basilia Jumbo, FNP  END OF SESSION:  End of Session - 04/02/23 1402     Visit Number [id number]    Number of Visits 53    Date for SLP Re-Evaluation 04/14/23    Authorization Type medicare    SLP Start Time 1315    SLP Stop Time  1402    SLP Time Calculation (min) 47 min    Activity Tolerance Patient tolerated treatment well                  Past Medical History:  Diagnosis Date   Medical history non-contributory    Past Surgical History:  Procedure Laterality Date   BACK SURGERY     FOOT SURGERY     INCISION AND DRAINAGE ABSCESS Left 03/29/2021   Procedure: INCISION AND DRAINAGE AXILLARY ABSCESS;  Surgeon: Griselda Miner, MD;  Location: [facility];  Service: General;  Laterality: Left;   Patient Active Problem List   Diagnosis Date Noted   Axillary abscess 03/30/2021    ONSET DATE: 11/06/22   REFERRING DIAG: W09.811 (ICD-10-CM) - Arterial ischemic stroke, MCA (middle cerebral artery), left, acute (HCC) Z74.09 (ICD-10-CM) - Other reduced mobility Z78.9 (ICD-10-CM) - Other specified health status I69.320 (ICD-10-CM) - Aphasia following cerebral infarction  THERAPY DIAG:  Aphasia  Verbal apraxia  Rationale for Evaluation and Treatment: Rehabilitation  SUBJECTIVE:   SUBJECTIVE STATEMENT: "fine"  PAIN:  Are you having pain? Yes: Pain location: neck  OBJECTIVE:   TODAY'S TREATMENT:   04-02-23: Briefly addressed texting today, in which pt exhibited difficulty with reading comprehension complicated by perseveration of written response. Frustration exhibited which appeared related to generating responses to open ended questions. Family report primarily asking yes/no questions secondary to prior baseline. SLP educated importance of asking personally relevant open ended  questions to optimize verbal expression. Targeted response elaboration for personally relevant questions, in which pt able to ID content words with and expand into appropriate sentence with occasional mod A. Usual fading to occasional mod A required to read mod complex sentences.   03-31-23: [name] responds to questions re: shopping. She has ordered pajamas on line with A from family, and has been shopping at [facility] 2x, using multimodal communication to direct her family to the sections she wants to go to and to select clothing she wants. She is wearing a new top she bought today. Targeted answering questions re: multimodal communication with typing to improve text communication. With usual mod A, she typed on SGD answers to recent activities and restaurants 3/5x.    To target word finding, multimodal communication, verbal apraxia and comprehension of "wh" questions  Scientist, product/process development (VNeST) was utilized. The pt generated 3 subjects and objects for 4 verbs (bake, throw, drive, wash), for a total of 12 subject objects. Pt required frequent mod to max verbal or choice of 2 cues. Pt generated 4 complex sentences by answering "wh" questions. Pt required frequent mod questioning, choice cues to generate complex sentences. With choral speech, she read the 12 subect-verb-object sentences 3x each with 70% accuracy. With choral speech and placement cues, she read 4 complex sentences twice each with 65% accuracy/approximations.    03-28-23: Returned with phone per request. Frustration exhibited and reported for typing. Modified screen to be horizontal to optimize typing ability. Targeted response to simple questions. Fixated on  reading question versus answering question. Usual repetition and clarification required to aid response formation due to perseverations. Pt benefited from intermittent mod visual and usual max verbal cues to aid writing and verbal expression at word level. Targeted functional  writing to send email to therapist. Min A required for content words and max A required for grammatical words.   03-26-23: Continued to target texting/typing using script training. Pt used SGD to type, as she did not bring phone. Given written model, pt wrote out common text message she may receive and a response. Pt occasionally required gestural cue to locate letters on the keyboard and typed message out with 90% accuracy. Clinician then went over script again, this time with giving pt options to choose from for her response, but no written model provided. Pt able to type coherent response in 5/5 opportunities given occasional min-A. Pt required encouragement because she often became frustrated during task.   03-19-23: Targeted personally relevant goal for texting/typing. Son reported pt has difficulty responding to texts, which is frustrating for patient. Targeted typing on SGD as phone not provided today. Usual fading to rare perseverations exhibited ("guess who") with some awareness of error. Spelling aloud was intermittent strength to reduce perseverations. Used scaffolding cues from copying fading to first letter at word level. Provided handout as reference for HEP. Requested pt bring phone next session to assess and address functional carryover.  03-17-23: Pt's family reported that she is struggling to text on her phone. She stated that is difficult to reply to spontaneous messages. She has been consistently practicing her word list at home and has been struggling with it. Pt became increasingly stressed and frustrated across session, reducing her speech intelligibility during conversation and a structured speech task. Pt intermittently benefited from models, although with less than 50% accuracy this date secondary to frustration. Pt was rushing her speech and struggling to communicate.   HEP: Bring phone next time so we can work on texting and planning messages and typing them out on the keyboard on  her SGD.   03-12-23: Targeted verbal apraxia in everyday sentences with carrier phrases of increasing length. I eat at..., I watch, ,I go to, I shop at, With frequent mod to max A, (fading choral cues) Sakari repeated 15 phrases with 65% intelligibility. Sentence length 4-7 words. In conversation, Liboria used AAC/multimodal communication to take 4 turns. She asked 2 questions with AAC. Ongoing frustration required cues to redirect.    PATIENT EDUCATION: Education details: Sees today's treatment and patient instructions, aphasia ed Person educated: Patient, Spouse, and Child(ren) Education method: Explanation, Demonstration, Verbal cues, and Handouts Education comprehension: verbalized understanding, verbal cues required, and needs further education   GOALS: Goals reviewed with patient? Yes  SHORT TERM GOALS: Target date: 12/25/22  Pt will name 5 family members with occasional mod A Baseline: Goal status: MET  2.  Pt will answer personally relevant yes/no questions accurately 85% with multimodal communication Baseline:  Goal status: MET  3.  Pt will ID 5 food and shopping preferences using multimodal communication with occasional min A Baseline:  Goal status: MET  4.  Pt will name 6 items in personally relevant categories using AAC/multimodal communication as needed with occasional mod A Baseline:  Goal status: NOT MET  5.  Family/caregivers will carryover 3 communication strategies to support pt's comprehension and expression Baseline:  Goal status: MET   LONG TERM GOALS: Target date: 04/14/23  Pt will initiate use mulitmodal communication to communicate wants/needs 3x over 1  week with usual min A from caregivers Baseline:  Goal status: MET  2.  Pt or caregivers will customize 4 items on AAC system with rare min A  Baseline:  Goal status: MET  3.  Pt will use multimodal communication to communicate 3 grocery items and 3 personal care items she needs with occasional min A from  family Baseline:  Goal status: MET  4.  Pt will access online shopping site she uses and purchase a needed item with occasional  min A from family Baseline: ongoing goal for re-cert 1/61/09 Goal status: ONGOING   5.  Using compensatory strategies and multimodal communication, pt will take 3turns in conversation with occasional min A Baseline:  Goal status: MET  6. Pt will produce 3-5 word conversational phrases with frequent mod A in structured task Baseline: new goal for re-cert 05/12/53 Goal Status: ONGOING  7. Pt will order 1-2 items dining out using multimodal communication over 3 restaurant meals with occasional min A from family             Baseline: new goal for recert 02/17/23             Goal Status: ONGOING  8. Tira will use appropriate conversational phrases over phone calls with her family 1-2x over 4 phone calls or face time with occasional min a from family                Baseline: new goal for re-cert 0/98/11                Goal Status: ONGOING       ASSESSMENT:  CLINICAL IMPRESSION: Joyous has demonstrated good progress - she is using SGD with mod I, writing at word level to augment verbal communication. Severe verbal apraxia persists, however Felecia has progressed from producing 1 word utterances in structured tasks to producing 3 word utterances and social phrases with occasional min A. She continues to require max A to carryover these phrases into spontaneous conversation. Aphasia has improved from severe to moderate. She remains frustrated with her difficulty communicating. Kaianna remains motivated and practices daily, carrying over all homework, using aphasia apps to practice. I recommend she Continue skilled ST services to maximize verbal and non verbal communication for QOL, to reduce caregiver burden and life participation.   OBJECTIVE IMPAIRMENTS: include attention, memory, aphasia, and apraxia. These impairments are limiting patient from return to work, managing  medications, managing appointments, managing finances, household responsibilities, ADLs/IADLs, and effectively communicating at home and in community. Factors affecting potential to achieve goals and functional outcome are severity of impairments. Patient will benefit from skilled SLP services to address above impairments and improve overall function.  REHAB POTENTIAL: Good  PLAN:  SLP FREQUENCY: 2x/week  SLP DURATION: 12 weeks  PLANNED INTERVENTIONS: Language facilitation, Environmental controls, Cueing hierachy, Cognitive reorganization, Internal/external aids, Functional tasks, Multimodal communication approach, SLP instruction and feedback, Compensatory strategies, and Patient/family education    Gracy Racer, CCC-SLP 04/02/2023, 2:03 PM

## 2023-04-07 ENCOUNTER — Ambulatory Visit: Payer: Medicare Other | Admitting: Speech Pathology

## 2023-04-07 DIAGNOSIS — R482 Apraxia: Secondary | ICD-10-CM

## 2023-04-07 DIAGNOSIS — R4701 Aphasia: Secondary | ICD-10-CM | POA: Diagnosis not present

## 2023-04-07 NOTE — Therapy (Signed)
OUTPATIENT SPEECH LANGUAGE PATHOLOGY TREATMENT   Patient Name: Kelsey Jacobs MRN: [id number] DOB:05-25-1946, 77 y.o., female Today's Date: 04/09/2023  PCP: Kelsey Honey MD REFERRING PROVIDER: Basilia Jumbo, FNP  END OF SESSION:         Past Medical History:  Diagnosis Date   Medical history non-contributory    Past Surgical History:  Procedure Laterality Date   BACK SURGERY     FOOT SURGERY     INCISION AND DRAINAGE ABSCESS Left 03/29/2021   Procedure: INCISION AND DRAINAGE AXILLARY ABSCESS;  Surgeon: Kelsey Miner, MD;  Location: [facility];  Service: General;  Laterality: Left;   Patient Active Problem List   Diagnosis Date Noted   Axillary abscess 03/30/2021    ONSET DATE: 11/06/22   REFERRING DIAG: W09.811 (ICD-10-CM) - Arterial ischemic stroke, MCA (middle cerebral artery), left, acute (HCC) Z74.09 (ICD-10-CM) - Other reduced mobility Z78.9 (ICD-10-CM) - Other specified health status 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 Yes: Pain location: neck  OBJECTIVE:   TODAY'S TREATMENT:   04-07-23: Kelsey Jacobs and Kelsey Jacobs continue to work daily with Kelsey Jacobs on structured response elaboration, generating words into longer sentences. To target verbal apraxia, word finding   Scientist, product/process development (VNeST) was utilized. The pt generated 3 subjects and objects for 4 verbs, (decorate, push, catch, deliver) for a total of 12 subject objects. Pt required frequent mod to max semantic and written choice of 2 cues. Pt generated 4 complex sentences by answering "wh" questions. Pt required frequent mod verbal and written  cues to generate complex sentences. Subject-verb-object phrases and complex sentences verbalized with  consistent max choral and placement cues with 50% approximation of words   04-02-23: Briefly addressed texting today, in which pt exhibited difficulty with reading comprehension complicated by perseveration of written response. Frustration exhibited which appeared related to generating responses to open ended questions. Family report primarily asking yes/no questions secondary to prior baseline. SLP educated importance of asking personally relevant open ended questions to optimize verbal expression. Targeted response elaboration for personally relevant questions, in which pt able to ID content words with and expand into appropriate sentence with occasional mod A. Usual fading to occasional mod A required to read mod complex sentences.   03-31-23: Kelsey Jacobs responds to questions re: shopping. She has ordered pajamas on line with A from family, and has been shopping at [facility] 2x, using multimodal communication to direct her family to the sections she wants to go to and to select clothing she wants. She is wearing a new top she bought today. Targeted answering questions re: multimodal communication with typing to improve text communication. With usual mod A, she typed on SGD answers to recent activities and restaurants 3/5x.    To target word finding, multimodal communication, verbal apraxia and comprehension of "wh" questions  Scientist, product/process development (VNeST) was utilized. The pt generated 3 subjects and objects for 4 verbs (bake, throw, drive, wash), for a total of 12 subject objects. Pt required frequent mod to max verbal or choice of 2 cues. Pt generated 4 complex sentences by answering "wh" questions. Pt required frequent mod questioning, choice cues to generate complex sentences. With choral speech, she read the 12 subect-verb-object sentences 3x each with 70% accuracy. With choral speech and  placement cues, she read 4 complex sentences twice each with 65% accuracy/approximations.    03-28-23: Returned  with phone per request. Frustration exhibited and reported for typing. Modified screen to be horizontal to optimize typing ability. Targeted response to simple questions. Fixated on reading question versus answering question. Usual repetition and clarification required to aid response formation due to perseverations. Pt benefited from intermittent mod visual and usual max verbal cues to aid writing and verbal expression at word level. Targeted functional writing to send email to therapist. Min A required for content words and max A required for grammatical words.   03-26-23: Continued to target texting/typing using script training. Pt used SGD to type, as she did not bring phone. Given written model, pt wrote out common text message she may receive and a response. Pt occasionally required gestural cue to locate letters on the keyboard and typed message out with 90% accuracy. Clinician then went over script again, this time with giving pt options to choose from for her response, but no written model provided. Pt able to type coherent response in 5/5 opportunities given occasional min-A. Pt required encouragement because she often became frustrated during task.   03-19-23: Targeted personally relevant goal for texting/typing. Son reported pt has difficulty responding to texts, which is frustrating for patient. Targeted typing on SGD as phone not provided today. Usual fading to rare perseverations exhibited ("guess who") with some awareness of error. Spelling aloud was intermittent strength to reduce perseverations. Used scaffolding cues from copying fading to first letter at word level. Provided handout as reference for HEP. Requested pt bring phone next session to assess and address functional carryover.  03-17-23: Pt's family reported that she is struggling to text on her phone. She stated that is difficult to reply to spontaneous messages. She has been consistently practicing her word list at home and has been  struggling with it. Pt became increasingly stressed and frustrated across session, reducing her speech intelligibility during conversation and a structured speech task. Pt intermittently benefited from models, although with less than 50% accuracy this date secondary to frustration. Pt was rushing her speech and struggling to communicate.   HEP: Bring phone next time so we can work on texting and planning messages and typing them out on the keyboard on her SGD.   03-12-23: Targeted verbal apraxia in everyday sentences with carrier phrases of increasing length. I eat at..., I watch, ,I go to, I shop at, With frequent mod to max A, (fading choral cues) Melvia repeated 15 phrases with 65% intelligibility. Sentence length 4-7 words. In conversation, Missie used AAC/multimodal communication to take 4 turns. She asked 2 questions with AAC. Ongoing frustration required cues to redirect.    PATIENT EDUCATION: Education details: Sees today's treatment and patient instructions, aphasia ed Person educated: Patient, Spouse, and Child(ren) Education method: Explanation, Demonstration, Verbal cues, and Handouts Education comprehension: verbalized understanding, verbal cues required, and needs further education   GOALS: Goals reviewed with patient? Yes  SHORT TERM GOALS: Target date: 12/25/22  Pt will name 5 family members with occasional mod A Baseline: Goal status: MET  2.  Pt will answer personally relevant yes/no questions accurately 85% with multimodal communication Baseline:  Goal status: MET  3.  Pt will ID 5 food and shopping preferences using multimodal communication with occasional min A Baseline:  Goal status: MET  4.  Pt will name 6 items in personally relevant categories using AAC/multimodal communication as needed with occasional mod A Baseline:  Goal status:  NOT MET  5.  Family/caregivers will carryover 3 communication strategies to support pt's comprehension and expression Baseline:  Goal  status: MET   LONG TERM GOALS: Target date: 04/14/23  Pt will initiate use mulitmodal communication to communicate wants/needs 3x over 1 week with usual min A from caregivers Baseline:  Goal status: MET  2.  Pt or caregivers will customize 4 items on AAC system with rare min A  Baseline:  Goal status: MET  3.  Pt will use multimodal communication to communicate 3 grocery items and 3 personal care items she needs with occasional min A from family Baseline:  Goal status: MET  4.  Pt will access online shopping site she uses and purchase a needed item with occasional  min A from family Baseline: ongoing goal for re-cert 1/61/09 Goal status: ONGOING   5.  Using compensatory strategies and multimodal communication, pt will take 3turns in conversation with occasional min A Baseline:  Goal status: MET  6. Pt will produce 3-5 word conversational phrases with frequent mod A in structured task Baseline: new goal for re-cert 05/12/53 Goal Status: ONGOING  7. Pt will order 1-2 items dining out using multimodal communication over 3 restaurant meals with occasional min A from family             Baseline: new goal for recert 02/17/23             Goal Status: ONGOING  8. Aleida will use appropriate conversational phrases over phone calls with her family 1-2x over 4 phone calls or face time with occasional min a from family                Baseline: new goal for re-cert 0/98/11                Goal Status: ONGOING       ASSESSMENT:  CLINICAL IMPRESSION: Artha has demonstrated good progress - she is using SGD with mod I, writing at word level to augment verbal communication. Severe verbal apraxia persists, however Yazmeen has progressed from producing 1 word utterances in structured tasks to producing 3 word utterances and social phrases with occasional min A. She continues to require max A to carryover these phrases into spontaneous conversation. Aphasia has improved from severe to moderate. She remains  frustrated with her difficulty communicating. Aarin remains motivated and practices daily, carrying over all homework, using aphasia apps to practice. I recommend she Continue skilled ST services to maximize verbal and non verbal communication for QOL, to reduce caregiver burden and life participation.   OBJECTIVE IMPAIRMENTS: include attention, memory, aphasia, and apraxia. These impairments are limiting patient from return to work, managing medications, managing appointments, managing finances, household responsibilities, ADLs/IADLs, and effectively communicating at home and in community. Factors affecting potential to achieve goals and functional outcome are severity of impairments. Patient will benefit from skilled SLP services to address above impairments and improve overall function.  REHAB POTENTIAL: Good  PLAN:  SLP FREQUENCY: 2x/week  SLP DURATION: 12 weeks  PLANNED INTERVENTIONS: Language facilitation, Environmental controls, Cueing hierachy, Cognitive reorganization, Internal/external aids, Functional tasks, Multimodal communication approach, SLP instruction and feedback, Compensatory strategies, and Patient/family education    Alice Reichert Jemma Rasp, CCC-SLP 04/09/2023, 8:37 AM

## 2023-04-08 NOTE — Therapy (Unsigned)
OUTPATIENT SPEECH LANGUAGE PATHOLOGY TREATMENT (DISCHARGE)   Patient Name: Kelsey Jacobs MRN: [id number] DOB:June 08, 1946, 77 y.o., female Today's Date: 04/08/2023  PCP: Alain Honey MD REFERRING PROVIDER: Basilia Jumbo, FNP  END OF SESSION:         Past Medical History:  Diagnosis Date   Medical history non-contributory    Past Surgical History:  Procedure Laterality Date   BACK SURGERY     FOOT SURGERY     INCISION AND DRAINAGE ABSCESS Left 03/29/2021   Procedure: INCISION AND DRAINAGE AXILLARY ABSCESS;  Surgeon: Griselda Miner, MD;  Location: [facility];  Service: General;  Laterality: Left;   Patient Active Problem List   Diagnosis Date Noted   Axillary abscess 03/30/2021    ONSET DATE: 11/06/22   REFERRING DIAG: W09.811 (ICD-10-CM) - Arterial ischemic stroke, MCA (middle cerebral artery), left, acute (HCC) Z74.09 (ICD-10-CM) - Other reduced mobility Z78.9 (ICD-10-CM) - Other specified health status I69.320 (ICD-10-CM) - Aphasia following cerebral infarction  THERAPY DIAG:  No diagnosis found.  Rationale for Evaluation and Treatment: Rehabilitation  SUBJECTIVE:   SUBJECTIVE STATEMENT: "I was working with her on elaborating her words into sentences"  PAIN:  Are you having pain? Yes: Pain location: neck  OBJECTIVE:   TODAY'S TREATMENT:   04/09/23: ***  04-07-23: Kelsey Jacobs and Kelsey Jacobs continue to work daily with Kelsey Jacobs on structured response elaboration, generating words into longer sentences. To target verbal apraxia, word finding   Scientist, product/process development (VNeST) was utilized. The pt generated 3 subjects and objects for 4 verbs, (decorate, push, catch, deliver) for a total of 12 subject objects. Pt required frequent mod to max semantic and written choice of 2 cues. Pt generated 4 complex sentences by answering "wh" questions. Pt required frequent mod verbal and written  cues to generate complex sentences. Subject-verb-object phrases and complex sentences  verbalized with consistent max choral and placement cues with 50% approximation of words    PATIENT EDUCATION: Education details: Sees today's treatment and patient instructions, aphasia ed Person educated: Patient, Spouse, and Child(ren) Education method: Explanation, Demonstration, Verbal cues, and Handouts Education comprehension: verbalized understanding, verbal cues required, and needs further education   GOALS: Goals reviewed with patient? Yes  SHORT TERM GOALS: Target date: 12/25/22  Pt will name 5 family members with occasional mod A Baseline: Goal status: MET  2.  Pt will answer personally relevant yes/no questions accurately 85% with multimodal communication Baseline:  Goal status: MET  3.  Pt will ID 5 food and shopping preferences using multimodal communication with occasional min A Baseline:  Goal status: MET  4.  Pt will name 6 items in personally relevant categories using AAC/multimodal communication as needed with occasional mod A Baseline:  Goal status: NOT MET  5.  Family/caregivers will carryover 3 communication strategies to support pt's comprehension and expression Baseline:  Goal status: MET   LONG TERM GOALS: Target date: 04/14/23  Pt will initiate use mulitmodal communication to communicate wants/needs 3x over 1 week with usual min A from caregivers Baseline:  Goal status: MET  2.  Pt or caregivers will customize 4 items on AAC system with rare min A  Baseline:  Goal status: MET  3.  Pt will use multimodal communication to communicate 3 grocery items and 3 personal care items she needs with occasional min A from family Baseline:  Goal status: MET  4.  Pt will access online shopping site she uses and purchase a needed item with occasional  min A from  family Baseline: ongoing goal for re-cert 03/17/80 Goal status: ONGOING   5.  Using compensatory strategies and multimodal communication, pt will take 3turns in conversation with occasional min  A Baseline:  Goal status: MET  6. Pt will produce 3-5 word conversational phrases with frequent mod A in structured task Baseline: new goal for re-cert 1/91/47 Goal Status: ONGOING  7. Pt will order 1-2 items dining out using multimodal communication over 3 restaurant meals with occasional min A from family             Baseline: new goal for recert 02/17/23             Goal Status: ONGOING  8. Kelsey Jacobs will use appropriate conversational phrases over phone calls with her family 1-2x over 4 phone calls or face time with occasional min a from family                Baseline: new goal for re-cert 08/07/55                Goal Status: ONGOING       ASSESSMENT:  CLINICAL IMPRESSION: Kelsey Jacobs has demonstrated good progress - she is using SGD with mod I, writing at word level to augment verbal communication. Severe verbal apraxia persists, however Kelsey Jacobs has progressed from producing 1 word utterances in structured tasks to producing 3 word utterances and social phrases with occasional min A. She continues to require max A to carryover these phrases into spontaneous conversation. Aphasia has improved from severe to moderate. She remains frustrated with her difficulty communicating. Kelsey Jacobs remains motivated and practices daily, carrying over all homework, using aphasia apps to practice. I recommend she Continue skilled ST services to maximize verbal and non verbal communication for QOL, to reduce caregiver burden and life participation.   OBJECTIVE IMPAIRMENTS: include attention, memory, aphasia, and apraxia. These impairments are limiting patient from return to work, managing medications, managing appointments, managing finances, household responsibilities, ADLs/IADLs, and effectively communicating at home and in community. Factors affecting potential to achieve goals and functional outcome are severity of impairments. Patient will benefit from skilled SLP services to address above impairments and improve overall  function.  REHAB POTENTIAL: Good  PLAN:  SLP FREQUENCY: 2x/week  SLP DURATION: 12 weeks  PLANNED INTERVENTIONS: Language facilitation, Environmental controls, Cueing hierachy, Cognitive reorganization, Internal/external aids, Functional tasks, Multimodal communication approach, SLP instruction and feedback, Compensatory strategies, and Patient/family education  SPEECH THERAPY DISCHARGE SUMMARY  Visits from Start of Care: ***  Current functional level related to goals / functional outcomes: ***   Remaining deficits: ***   Education / Equipment: ***   Patient agrees to discharge. Patient goals were {OP Goals:25702::"met"}. Patient is being discharged due to {OP Discharge Reasons:25703::"meeting the stated rehab goals."}.     Maia Breslow, CCC-SLP 04/08/2023, 3:23 PM

## 2023-04-09 ENCOUNTER — Ambulatory Visit: Payer: Medicare Other | Attending: Nurse Practitioner | Admitting: Speech Pathology

## 2023-04-09 DIAGNOSIS — R482 Apraxia: Secondary | ICD-10-CM

## 2023-04-09 DIAGNOSIS — R4701 Aphasia: Secondary | ICD-10-CM | POA: Diagnosis not present

## 2023-04-14 ENCOUNTER — Encounter: Payer: Self-pay | Admitting: Speech Pathology

## 2023-04-14 ENCOUNTER — Ambulatory Visit: Payer: Medicare Other | Admitting: Speech Pathology

## 2023-04-14 DIAGNOSIS — R4701 Aphasia: Secondary | ICD-10-CM | POA: Diagnosis not present

## 2023-04-14 DIAGNOSIS — R482 Apraxia: Secondary | ICD-10-CM

## 2023-04-14 NOTE — Therapy (Signed)
OUTPATIENT SPEECH LANGUAGE PATHOLOGY TREATMENT   Patient Name: Kelsey Jacobs MRN: [id number] DOB:[birth year], 77 y.o., female Today's Date: 04/14/2023  PCP: Alain Honey MD REFERRING PROVIDER: Basilia Jumbo, FNP  END OF SESSION:  End of Session - 04/14/23 1407     Visit Number 36    Number of Visits 53    Date for SLP Re-Evaluation 82/95/62   re-cert 12/11/06   Authorization Type medicare    SLP Start Time 1400    SLP Stop Time  1445    SLP Time Calculation (min) 45 min    Activity Tolerance Patient tolerated treatment well                   Past Medical History:  Diagnosis Date   Medical history non-contributory    Past Surgical History:  Procedure Laterality Date   BACK SURGERY     FOOT SURGERY     INCISION AND DRAINAGE ABSCESS Left 03/29/2021   Procedure: INCISION AND DRAINAGE AXILLARY ABSCESS;  Surgeon: Griselda Miner, MD;  Location: [facility];  Service: General;  Laterality: Left;   Patient Active Problem List   Diagnosis Date Noted   Axillary abscess 03/30/2021    ONSET DATE: 11/06/22   REFERRING DIAG: M57.846 (ICD-10-CM) - Arterial ischemic stroke, MCA (middle cerebral artery), left, acute (HCC) Z74.09 (ICD-10-CM) - Other reduced mobility Z78.9 (ICD-10-CM) - Other specified health status I69.320 (ICD-10-CM) - Aphasia following cerebral infarction  THERAPY DIAG:  Aphasia - Plan: SLP plan of care cert/re-cert  Verbal apraxia - Plan: SLP plan of care cert/re-cert  Rationale for Evaluation and Treatment: Rehabilitation  SUBJECTIVE:   SUBJECTIVE STATEMENT: "I was working with her on elaborating her words into sentences"  PAIN:  Are you having pain? Yes: Pain location: neck  OBJECTIVE:   TODAY'S TREATMENT:   04/14/23: Targeted multimodal communication, verbal compensations for aphasia and word finding in complex naming task generating 3 similarities and 3 differences for related items. With structured visual chart and frequent mod questioning  cues, written cues to aid auditory comprehension and written choice cues, Isabela generated 3 similarities and differences for 3 related pairs (coffee and cola; police and Theatre stage manager; radio and TV). She read/approximated the similarities and differences with 70% accuracy with phonemic and placement cues. They wish to continue skilled ST - re-cert completed today - continue goals   04/09/23: Ongoing practice at sentence generation level using VNeST. Usual max-A required to generate 3 subject-verb-object sentences for 3 verbs, resulting in 9 total sentences. To produce accurately, use of apraxia strategies and repetitive practice to improve accuracy was utilized. Pt continues to benefit from direct model and choral production while looking at partners mouth to aid in accuracy.   4-29-24[name] and Al continue to work daily with Kelsey Jacobs on structured response elaboration, generating words into longer sentences. To target verbal apraxia, word finding   Scientist, product/process development (VNeST) was utilized. The pt generated 3 subjects and objects for 4 verbs, (decorate, push, catch, deliver) for a total of 12 subject objects. Pt required frequent mod to max semantic and written choice of 2 cues. Pt generated 4 complex sentences by answering "wh" questions. Pt required frequent mod verbal and written  cues to generate complex sentences. Subject-verb-object phrases and complex sentences verbalized with consistent max choral and placement cues with 50% approximation of words    PATIENT EDUCATION: Education details: Sees today's treatment and patient instructions, aphasia ed Person educated: Patient, Spouse, and Child(ren) Education method: Explanation,  Demonstration, Verbal cues, and Handouts Education comprehension: verbalized understanding, verbal cues required, and needs further education   GOALS: Goals reviewed with patient? Yes  SHORT TERM GOALS: Target date: 12/25/22  Pt will name 5 family members with  occasional mod A Baseline: Goal status: MET  2.  Pt will answer personally relevant yes/no questions accurately 85% with multimodal communication Baseline:  Goal status: MET  3.  Pt will ID 5 food and shopping preferences using multimodal communication with occasional min A Baseline:  Goal status: MET  4.  Pt will name 6 items in personally relevant categories using AAC/multimodal communication as needed with occasional mod A Baseline:  Goal status: NOT MET  5.  Family/caregivers will carryover 3 communication strategies to support pt's comprehension and expression Baseline:  Goal status: MET   LONG TERM GOALS: Target date: 4/0/98 (re-cert)  Pt will initiate use mulitmodal communication to communicate wants/needs 3x over 1 week with usual min A from caregivers Baseline:  Goal status: MET  2.  Pt or caregivers will customize 4 items on AAC system with rare min A  Baseline:  Goal status: MET  3.  Pt will use multimodal communication to communicate 3 grocery items and 3 personal care items she needs with occasional min A from family Baseline:  Goal status: MET  4.  Pt will access online shopping site she uses and purchase a needed item with occasional  min A from family Baseline: ongoing goal for re-cert 12/27/12 Goal status: ONGOING   5.  Using compensatory strategies and multimodal communication, pt will take 3turns in conversation with occasional min A Baseline:  Goal status: MET  6. Pt will produce 3-5 word conversational phrases with frequent mod A in structured task Baseline: new goal for re-cert 7/82/95 Goal Status: ONGOING  7. Pt will order 1-2 items dining out using multimodal communication over 3 restaurant meals with occasional min A from family             Baseline: new goal for recert 02/17/23             Goal Status: ONGOING  8. Aia will use appropriate conversational phrases over phone calls with her family 1-2x over 4 phone calls or face time with  occasional min a from family                Baseline: new goal for re-cert 05/29/29                Goal Status: ONGOING       ASSESSMENT:  CLINICAL IMPRESSION: Kamonie has demonstrated good progress - she is using SGD with mod I, writing at word level to augment verbal communication. Severe verbal apraxia persists, however Kaprice has progressed from producing 3 word utterances in structured tasks to producing 7-9 word utterances and social phrases with usual miod A. She continues to require max A to carryover these phrases into spontaneous conversation. Aphasia has improved from severe to moderate. She remains frustrated with her difficulty communicating. Tsuyako remains motivated and practices daily, carrying over all homework, using aphasia apps to practice. I recommend she Continue skilled ST services to maximize verbal and non verbal communication for QOL, to reduce caregiver burden and life participation. Re-cert completed 04/14/23, with frequency reduced to 1x a week  OBJECTIVE IMPAIRMENTS: include attention, memory, aphasia, and apraxia. These impairments are limiting patient from return to work, managing medications, managing appointments, managing finances, household responsibilities, ADLs/IADLs, and effectively communicating at home and in community. Factors  affecting potential to achieve goals and functional outcome are severity of impairments. Patient will benefit from skilled SLP services to address above impairments and improve overall function.  REHAB POTENTIAL: Good  PLAN:  SLP FREQUENCY: 1x a week  SLP DURATION: 24 weeks  PLANNED INTERVENTIONS: Language facilitation, Environmental controls, Cueing hierachy, Cognitive reorganization, Internal/external aids, Functional tasks, Multimodal communication approach, SLP instruction and feedback, Compensatory strategies, and Patient/family education   Alice Reichert Anaria Sieker, CCC-SLP 04/14/2023, 2:55 PM

## 2023-04-21 ENCOUNTER — Ambulatory Visit: Payer: Medicare Other | Admitting: Speech Pathology

## 2023-04-21 ENCOUNTER — Encounter: Payer: Self-pay | Admitting: Speech Pathology

## 2023-04-21 DIAGNOSIS — R4701 Aphasia: Secondary | ICD-10-CM

## 2023-04-21 DIAGNOSIS — R482 Apraxia: Secondary | ICD-10-CM

## 2023-04-21 NOTE — Therapy (Signed)
OUTPATIENT SPEECH LANGUAGE PATHOLOGY TREATMENT   Patient Name: Kelsey Jacobs MRN: [id number] DOB:Aug 13, 1946, 77 y.o., female Today's Date: 04/21/2023  PCP: Kelsey Honey MD REFERRING PROVIDER: Basilia Jumbo, FNP  END OF SESSION:  End of Session - 04/21/23 1320     Visit Number 37    Number of Visits 53    Date for SLP Re-Evaluation 06/09/23    Authorization Type medicare    SLP Start Time 1315    SLP Stop Time  1400    SLP Time Calculation (min) 45 min    Activity Tolerance Patient tolerated treatment well                   Past Medical History:  Diagnosis Date   Medical history non-contributory    Past Surgical History:  Procedure Laterality Date   BACK SURGERY     FOOT SURGERY     INCISION AND DRAINAGE ABSCESS Left 03/29/2021   Procedure: INCISION AND DRAINAGE AXILLARY ABSCESS;  Surgeon: Griselda Miner, MD;  Location: [facility];  Service: General;  Laterality: Left;   Patient Active Problem List   Diagnosis Date Noted   Axillary abscess 03/30/2021    ONSET DATE: 11/06/22   REFERRING DIAG: W29.562 (ICD-10-CM) - Arterial ischemic stroke, MCA (middle cerebral artery), left, acute (HCC) Z74.09 (ICD-10-CM) - Other reduced mobility Z78.9 (ICD-10-CM) - Other specified health status 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 Yes: Pain location: neck  OBJECTIVE:   TODAY'S TREATMENT:   04/21/23: To target word finding, verbal apraxia and sentence generation,   Scientist, product/process development (VNeST) was utilized. The pt generated 3 subjects and objects for 3 verbs (hold, drink plant), for a total of 9 subject objects. Pt required usual mod to max verbal and written cues. Pt generated 3 complex sentences by  answering "wh" questions. Pt required usual mod semantic  cues and choice of 2 to generate complex sentences. In conversation, multimodal communication targeted using SGD white board to write 3 places she went last week with her son with occasional min to mod A.    04/14/23: Targeted multimodal communication, verbal compensations for aphasia and word finding in complex naming task generating 3 similarities and 3 differences for related items. With structured visual chart and frequent mod questioning cues, written cues to aid auditory comprehension and written choice cues, Kelsey Jacobs generated 3 similarities and differences for 3 related pairs (coffee and cola; police and Theatre stage manager; radio and TV). She read/approximated the similarities and differences with 70% accuracy with phonemic and placement cues. They wish to continue skilled ST - re-cert completed today - continue goals   04/09/23: Ongoing practice at sentence generation level using VNeST. Usual max-A required to generate 3 subject-verb-object sentences for 3 verbs, resulting in 9 total sentences. To produce accurately, use of apraxia strategies and repetitive practice to improve accuracy was utilized. Pt continues to benefit from direct model and choral production while looking at partners mouth to aid in accuracy.    PATIENT EDUCATION: Education details: Sees today's treatment and patient instructions, aphasia ed Person educated: Patient, Spouse, and Child(ren) Education method: Explanation, Demonstration, Verbal cues, and Handouts Education comprehension: verbalized understanding, verbal cues required, and needs further education   GOALS: Goals reviewed with patient? Yes  SHORT TERM GOALS: Target date: 12/25/22  Pt will name 5 family members with occasional mod A Baseline: Goal status: MET  2.  Pt will answer personally relevant yes/no questions accurately 85% with multimodal communication Baseline:  Goal status: MET  3.  Pt will ID 5  food and shopping preferences using multimodal communication with occasional min A Baseline:  Goal status: MET  4.  Pt will name 6 items in personally relevant categories using AAC/multimodal communication as needed with occasional mod A Baseline:  Goal status: NOT MET  5.  Family/caregivers will carryover 3 communication strategies to support pt's comprehension and expression Baseline:  Goal status: MET   LONG TERM GOALS: Target date: 12/14/08 (re-cert)  Pt will initiate use mulitmodal communication to communicate wants/needs 3x over 1 week with usual min A from caregivers Baseline:  Goal status: MET  2.  Pt or caregivers will customize 4 items on AAC system with rare min A  Baseline:  Goal status: MET  3.  Pt will use multimodal communication to communicate 3 grocery items and 3 personal care items she needs with occasional min A from family Baseline:  Goal status: MET  4.  Pt will access online shopping site she uses and purchase a needed item with occasional  min A from family Baseline: ongoing goal for re-cert 9/60/45 Goal status: ONGOING   5.  Using compensatory strategies and multimodal communication, pt will take 3turns in conversation with occasional min A Baseline:  Goal status: MET  6. Pt will produce 3-5 word conversational phrases with frequent mod A in structured task Baseline: new goal for re-cert 03/17/80 Goal Status: ONGOING  7. Pt will order 1-2 items dining out using multimodal communication over 3 restaurant meals with occasional min A from family             Baseline: new goal for recert 02/17/23             Goal Status: ONGOING  8. Kelsey Jacobs will use appropriate conversational phrases over phone calls with her family 1-2x over 4 phone calls or face time with occasional min a from family                Baseline: new goal for re-cert 1/91/47                Goal Status: ONGOING       ASSESSMENT:  CLINICAL IMPRESSION: Kelsey Jacobs has demonstrated good progress - she  is using SGD with mod I, writing at word level to augment verbal communication. Severe verbal apraxia persists, however Kelsey Jacobs has progressed from producing 3 word utterances in structured tasks to producing 7-9 word utterances and social phrases with usual miod A. She continues to require max A to carryover these phrases into spontaneous conversation. Aphasia has improved from severe to moderate. She remains frustrated with her difficulty communicating. Jacquetta remains motivated and practices daily, carrying over all homework, using aphasia apps to practice. Improvement in length and frequency of spontaneous verbalizations in conversation continue, to length of 4 words occasionally.  I recommend she Continue skilled ST services to maximize verbal and non verbal communication for QOL, to reduce caregiver burden and life participation. Re-cert completed 04/14/23, with frequency reduced to 1x a week  OBJECTIVE IMPAIRMENTS: include attention, memory, aphasia, and apraxia. These impairments are limiting patient from return to work, managing medications, managing appointments, managing finances, household responsibilities, ADLs/IADLs, and effectively communicating at home and in community. Factors affecting potential to achieve goals and functional  outcome are severity of impairments. Patient will benefit from skilled SLP services to address above impairments and improve overall function.  REHAB POTENTIAL: Good  PLAN:  SLP FREQUENCY: 1x a week  SLP DURATION: 24 weeks  PLANNED INTERVENTIONS: Language facilitation, Environmental controls, Cueing hierachy, Cognitive reorganization, Internal/external aids, Functional tasks, Multimodal communication approach, SLP instruction and feedback, Compensatory strategies, and Patient/family education   Nyasiah, Mazey, CCC-SLP 04/21/2023, 2:00 PM

## 2023-04-28 ENCOUNTER — Ambulatory Visit: Payer: Medicare Other | Admitting: Speech Pathology

## 2023-04-28 ENCOUNTER — Encounter: Payer: Self-pay | Admitting: Speech Pathology

## 2023-04-28 DIAGNOSIS — R482 Apraxia: Secondary | ICD-10-CM

## 2023-04-28 DIAGNOSIS — R4701 Aphasia: Secondary | ICD-10-CM | POA: Diagnosis not present

## 2023-04-28 NOTE — Therapy (Signed)
OUTPATIENT SPEECH LANGUAGE PATHOLOGY TREATMENT   Patient Name: Kelsey Jacobs MRN: [id number] DOB:[birth year], 77 y.o., female Today's Date: 04/28/2023  PCP: Alain Honey MD REFERRING PROVIDER: Basilia Jumbo, FNP  END OF SESSION:  End of Session - 04/28/23 1319     Visit Number 38    Number of Visits 53    Date for SLP Re-Evaluation 06/09/23    Authorization Type medicare    SLP Start Time 1315    SLP Stop Time  1400    SLP Time Calculation (min) 45 min    Activity Tolerance Patient tolerated treatment well                   Past Medical History:  Diagnosis Date   Medical history non-contributory    Past Surgical History:  Procedure Laterality Date   BACK SURGERY     FOOT SURGERY     INCISION AND DRAINAGE ABSCESS Left 03/29/2021   Procedure: INCISION AND DRAINAGE AXILLARY ABSCESS;  Surgeon: Griselda Miner, MD;  Location: [facility];  Service: General;  Laterality: Left;   Patient Active Problem List   Diagnosis Date Noted   Axillary abscess 03/30/2021    ONSET DATE: 11/06/22   REFERRING DIAG: W09.811 (ICD-10-CM) - Arterial ischemic stroke, MCA (middle cerebral artery), left, acute (HCC) Z74.09 (ICD-10-CM) - Other reduced mobility Z78.9 (ICD-10-CM) - Other specified health status 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 No  OBJECTIVE:   TODAY'S TREATMENT:   04/28/23: Family continues to report improving spontaneous speech - Azhar verbalized that she saw friends, went over to their place and attempted to tell me they ate crab legs. She reqiured verbal cues and approximated "crab legs" 50% accuracy 5x. Targeted verbal apraxia and word finding and multimodal communication naming mid frequency photos and  generating simple sentence with the word. Geena required consistent questioning cues to generate telegraphic phrase for each photo. She required usual mod A to name each photo. Consistent max written cues and frequent choral reading to generate syntactically correct 4-6 word sentences. She approximated 65% of words 7/10 sentences. They are taking [place] to the eye doctor. Trained Al on providing ophthalmology staff that due to aphasia, Martica may mis read letters, which may not reflect her vision. He is aware to educate staff. Targeted reading letters on white board in anticipation of this visit, [place] read 3 lines of 5 letters with 1 error which she questioning cue to correct "You said T" which she immediately corrected.   04/21/23: To target word finding, verbal apraxia and sentence generation,   Scientist, product/process development (VNeST) was utilized. The pt generated 3 subjects and objects for 3 verbs (hold, drink plant), for a total of 9 subject objects. Pt required usual mod to max verbal and written cues. Pt generated 3 complex sentences by answering "wh" questions. Pt required usual mod semantic  cues and choice of 2 to generate complex sentences. In conversation, multimodal communication targeted using SGD white board to write 3 places she went last week with her son with occasional min to mod A.   04/14/23: Targeted multimodal communication, verbal compensations for aphasia and word finding in complex naming task generating 3 similarities and 3 differences for related items. With structured visual chart and frequent  mod questioning cues, written cues to aid auditory comprehension and written choice cues, Tyshika generated 3 similarities and differences for 3 related pairs (coffee and cola; police and Theatre stage manager; radio and TV). She read/approximated the similarities and differences with 70% accuracy with phonemic and placement cues. They wish to continue skilled ST - re-cert completed today - continue goals    04/09/23: Ongoing practice at sentence generation level using VNeST. Usual max-A required to generate 3 subject-verb-object sentences for 3 verbs, resulting in 9 total sentences. To produce accurately, use of apraxia strategies and repetitive practice to improve accuracy was utilized. Pt continues to benefit from direct model and choral production while looking at partners mouth to aid in accuracy.    PATIENT EDUCATION: Education details: Sees today's treatment and patient instructions, aphasia ed Person educated: Patient, Spouse, and Child(ren) Education method: Explanation, Demonstration, Verbal cues, and Handouts Education comprehension: verbalized understanding, verbal cues required, and needs further education   GOALS: Goals reviewed with patient? Yes  SHORT TERM GOALS: Target date: 12/25/22  Pt will name 5 family members with occasional mod A Baseline: Goal status: MET  2.  Pt will answer personally relevant yes/no questions accurately 85% with multimodal communication Baseline:  Goal status: MET  3.  Pt will ID 5 food and shopping preferences using multimodal communication with occasional min A Baseline:  Goal status: MET  4.  Pt will name 6 items in personally relevant categories using AAC/multimodal communication as needed with occasional mod A Baseline:  Goal status: NOT MET  5.  Family/caregivers will carryover 3 communication strategies to support pt's comprehension and expression Baseline:  Goal status: MET   LONG TERM GOALS: Target date: 12/14/08 (re-cert)  Pt will initiate use mulitmodal communication to communicate wants/needs 3x over 1 week with usual min A from caregivers Baseline:  Goal status: MET  2.  Pt or caregivers will customize 4 items on AAC system with rare min A  Baseline:  Goal status: MET  3.  Pt will use multimodal communication to communicate 3 grocery items and 3 personal care items she needs with occasional min A from family Baseline:   Goal status: MET  4.  Pt will access online shopping site she uses and purchase a needed item with occasional  min A from family Baseline: ongoing goal for re-cert 9/60/45 Goal status: ONGOING   5.  Using compensatory strategies and multimodal communication, pt will take 3turns in conversation with occasional min A Baseline:  Goal status: MET  6. Pt will produce 3-5 word conversational phrases with frequent mod A in structured task Baseline: new goal for re-cert 03/17/80 Goal Status: ONGOING  7. Pt will order 1-2 items dining out using multimodal communication over 3 restaurant meals with occasional min A from family             Baseline: new goal for recert 02/17/23             Goal Status: ONGOING  8. Lawan will use appropriate conversational phrases over phone calls with her family 1-2x over 4 phone calls or face time with occasional min a from family                Baseline: new goal for re-cert 1/91/47                Goal Status: ONGOING       ASSESSMENT:  CLINICAL IMPRESSION: Desaray has demonstrated good progress - she is using SGD with mod I, writing at  word level to augment verbal communication. Severe verbal apraxia persists, however Izzie has progressed from producing 3 word utterances in structured tasks to producing 7-9 word utterances and social phrases with usual miod A. She continues to require max A to carryover these phrases into spontaneous conversation. Aphasia has improved from severe to moderate. She remains frustrated with her difficulty communicating. Cynthie remains motivated and practices daily, carrying over all homework, using aphasia apps to practice. Improvement in length and frequency of spontaneous verbalizations in conversation continue, to length of 4 words occasionally.  I recommend she Continue skilled ST services to maximize verbal and non verbal communication for QOL, to reduce caregiver burden and life participation. Re-cert completed 04/14/23, with frequency reduced  to 1x a week  OBJECTIVE IMPAIRMENTS: include attention, memory, aphasia, and apraxia. These impairments are limiting patient from return to work, managing medications, managing appointments, managing finances, household responsibilities, ADLs/IADLs, and effectively communicating at home and in community. Factors affecting potential to achieve goals and functional outcome are severity of impairments. Patient will benefit from skilled SLP services to address above impairments and improve overall function.  REHAB POTENTIAL: Good  PLAN:  SLP FREQUENCY: 1x a week  SLP DURATION: 24 weeks  PLANNED INTERVENTIONS: Language facilitation, Environmental controls, Cueing hierachy, Cognitive reorganization, Internal/external aids, Functional tasks, Multimodal communication approach, SLP instruction and feedback, Compensatory strategies, and Patient/family education   Mukta, Svatos, CCC-SLP 04/28/2023, 3:03 PM

## 2023-05-07 ENCOUNTER — Ambulatory Visit: Payer: Medicare Other | Admitting: Speech Pathology

## 2023-05-07 ENCOUNTER — Encounter: Payer: Self-pay | Admitting: Speech Pathology

## 2023-05-07 DIAGNOSIS — R482 Apraxia: Secondary | ICD-10-CM

## 2023-05-07 DIAGNOSIS — R4701 Aphasia: Secondary | ICD-10-CM

## 2023-05-07 NOTE — Therapy (Signed)
OUTPATIENT SPEECH LANGUAGE PATHOLOGY TREATMENT   Patient Name: Kelsey Jacobs MRN: [id number] DOB:[birth year], 77 y.o., female Today's Date: 05/07/2023  PCP: Alain Honey MD REFERRING PROVIDER: Basilia Jumbo, FNP  END OF SESSION:  End of Session - 05/07/23 1323     Visit Number 39    Number of Visits 53    Date for SLP Re-Evaluation 06/09/23    Authorization Type medicare                   Past Medical History:  Diagnosis Date   Medical history non-contributory    Past Surgical History:  Procedure Laterality Date   BACK SURGERY     FOOT SURGERY     INCISION AND DRAINAGE ABSCESS Left 03/29/2021   Procedure: INCISION AND DRAINAGE AXILLARY ABSCESS;  Surgeon: Griselda Miner, MD;  Location: [facility];  Service: General;  Laterality: Left;   Patient Active Problem List   Diagnosis Date Noted   Axillary abscess 03/30/2021    ONSET DATE: 11/06/22   REFERRING DIAG: W09.811 (ICD-10-CM) - Arterial ischemic stroke, MCA (middle cerebral artery), left, acute (HCC) Z74.09 (ICD-10-CM) - Other reduced mobility Z78.9 (ICD-10-CM) - Other specified health status 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 Yes, left leg - keeping her up at night  OBJECTIVE:   TODAY'S TREATMENT:   05/07/23: Versie continues to demonstrate improving spontaneous speech, social greetings and conversation. She & Al report that she is ordering items using the menu to read them with success 60% of time. She ordered her water and wine last time they went out. Encouraged them to look up the menu and practice before hand. Targeted verbal apraxia, word finding and sentence generating Id'ing 2 meanings of multiple meaning words and generating 4-5 word sentences with each meaning. Nithya  required frequent mod verbal and written cues to generate 2 meanings for 10 words. She required consistent max verbal, questioning and written cues to generate 20 sentences. She verbalized 12/20 intelligibly with usual mod A, 8/20 unintelligible with consistent max A  04/28/23: Family continues to report improving spontaneous speech - Jamera verbalized that she saw friends, went over to their place and attempted to tell me they ate crab legs. She reqiured verbal cues and approximated "crab legs" 50% accuracy 5x. Targeted verbal apraxia and word finding and multimodal communication naming mid frequency photos and generating simple sentence with the word. Anwita required consistent questioning cues to generate telegraphic phrase for each photo. She required usual mod A to name each photo. Consistent max written cues and frequent choral reading to generate syntactically correct 4-6 word sentences. She approximated 65% of words 7/10 sentences. They are taking [place] to the eye doctor. Trained Al on providing ophthalmology staff that due to aphasia, Bretta may mis read letters, which may not reflect her vision. He is aware to educate staff. Targeted reading letters on white board in anticipation of this visit, [place] read 3 lines of 5 letters with 1 error which she questioning cue to correct "You said T" which she immediately corrected.   04/21/23: To target word finding, verbal apraxia and sentence generation,   Scientist, product/process development (VNeST) was utilized. The pt generated 3 subjects and objects for 3 verbs (hold, drink plant), for a total of 9 subject objects. Pt  required usual mod to max verbal and written cues. Pt generated 3 complex sentences by answering "wh" questions. Pt required usual mod semantic  cues and choice of 2 to generate complex sentences. In conversation, multimodal communication targeted using SGD white board to write 3 places she went last week with her son with occasional min to mod A.   04/14/23:  Targeted multimodal communication, verbal compensations for aphasia and word finding in complex naming task generating 3 similarities and 3 differences for related items. With structured visual chart and frequent mod questioning cues, written cues to aid auditory comprehension and written choice cues, Jaz generated 3 similarities and differences for 3 related pairs (coffee and cola; police and Theatre stage manager; radio and TV). She read/approximated the similarities and differences with 70% accuracy with phonemic and placement cues. They wish to continue skilled ST - re-cert completed today - continue goals   04/09/23: Ongoing practice at sentence generation level using VNeST. Usual max-A required to generate 3 subject-verb-object sentences for 3 verbs, resulting in 9 total sentences. To produce accurately, use of apraxia strategies and repetitive practice to improve accuracy was utilized. Pt continues to benefit from direct model and choral production while looking at partners mouth to aid in accuracy.    PATIENT EDUCATION: Education details: Sees today's treatment and patient instructions, aphasia ed Person educated: Patient, Spouse, and Child(ren) Education method: Explanation, Demonstration, Verbal cues, and Handouts Education comprehension: verbalized understanding, verbal cues required, and needs further education   GOALS: Goals reviewed with patient? Yes  SHORT TERM GOALS: Target date: 12/25/22  Pt will name 5 family members with occasional mod A Baseline: Goal status: MET  2.  Pt will answer personally relevant yes/no questions accurately 85% with multimodal communication Baseline:  Goal status: MET  3.  Pt will ID 5 food and shopping preferences using multimodal communication with occasional min A Baseline:  Goal status: MET  4.  Pt will name 6 items in personally relevant categories using AAC/multimodal communication as needed with occasional mod A Baseline:  Goal status: NOT  MET  5.  Family/caregivers will carryover 3 communication strategies to support pt's comprehension and expression Baseline:  Goal status: MET   LONG TERM GOALS: Target date: 12/17/12 (re-cert)  Pt will initiate use mulitmodal communication to communicate wants/needs 3x over 1 week with usual min A from caregivers Baseline:  Goal status: MET  2.  Pt or caregivers will customize 4 items on AAC system with rare min A  Baseline:  Goal status: MET  3.  Pt will use multimodal communication to communicate 3 grocery items and 3 personal care items she needs with occasional min A from family Baseline:  Goal status: MET  4.  Pt will access online shopping site she uses and purchase a needed item with occasional  min A from family Baseline: ongoing goal for re-cert 7/82/95 Goal status: ONGOING   5.  Using compensatory strategies and multimodal communication, pt will take 3turns in conversation with occasional min A Baseline:  Goal status: MET  6. Pt will produce 3-5 word conversational phrases with frequent mod A in structured task Baseline: new goal for re-cert 05/29/29 Goal Status: ONGOING  7. Pt will order 1-2 items dining out using multimodal communication over 3 restaurant meals with occasional min A from family             Baseline: new goal for recert 02/17/23             Goal Status: ONGOING  8. Yakeline will use appropriate conversational phrases over phone calls with her family 1-2x over 4 phone calls or face time with occasional min a from family                Baseline: new goal for re-cert 1/61/09                Goal Status: ONGOING       ASSESSMENT:  CLINICAL IMPRESSION: Jeily has demonstrated good progress - she is using SGD with mod I, writing at word level to augment verbal communication. Severe verbal apraxia persists, however Refugia has progressed from producing 3 word utterances in structured tasks to producing 7-9 word utterances and social phrases with usual miod A. She  continues to require max A to carryover these phrases into spontaneous conversation. Aphasia has improved from severe to moderate. She remains frustrated with her difficulty communicating. Iviana remains motivated and practices daily, carrying over all homework, using aphasia apps to practice. Improvement in length and frequency of spontaneous verbalizations in conversation continue, to length of 4 words occasionally.  I recommend she Continue skilled ST services to maximize verbal and non verbal communication for QOL, to reduce caregiver burden and life participation. Re-cert completed 04/14/23, with frequency reduced to 1x a week  OBJECTIVE IMPAIRMENTS: include attention, memory, aphasia, and apraxia. These impairments are limiting patient from return to work, managing medications, managing appointments, managing finances, household responsibilities, ADLs/IADLs, and effectively communicating at home and in community. Factors affecting potential to achieve goals and functional outcome are severity of impairments. Patient will benefit from skilled SLP services to address above impairments and improve overall function.  REHAB POTENTIAL: Good  PLAN:  SLP FREQUENCY: 1x a week  SLP DURATION: 24 weeks  PLANNED INTERVENTIONS: Language facilitation, Environmental controls, Cueing hierachy, Cognitive reorganization, Internal/external aids, Functional tasks, Multimodal communication approach, SLP instruction and feedback, Compensatory strategies, and Patient/family education   Zailyn, Mintz, CCC-SLP 05/07/2023, 2:38 PM

## 2023-05-12 ENCOUNTER — Ambulatory Visit: Payer: Medicare Other | Attending: Nurse Practitioner | Admitting: Speech Pathology

## 2023-05-12 ENCOUNTER — Encounter: Payer: Self-pay | Admitting: Speech Pathology

## 2023-05-12 DIAGNOSIS — R4701 Aphasia: Secondary | ICD-10-CM | POA: Diagnosis present

## 2023-05-12 DIAGNOSIS — R482 Apraxia: Secondary | ICD-10-CM | POA: Diagnosis present

## 2023-05-12 NOTE — Therapy (Signed)
OUTPATIENT SPEECH LANGUAGE PATHOLOGY TREATMENT   Patient Name: Kelsey Jacobs MRN: [id number] DOB:07-08-1946, 77 y.o., female Today's Date: 05/12/2023  PCP: Alain Honey MD REFERRING PROVIDER: Basilia Jumbo, FNP  END OF SESSION:  End of Session - 05/12/23 1323     Visit Number 40    Number of Visits 53    Date for SLP Re-Evaluation 06/09/23    Authorization Type medicare    SLP Start Time 1315    SLP Stop Time  1400    SLP Time Calculation (min) 45 min    Activity Tolerance Patient tolerated treatment well                   Past Medical History:  Diagnosis Date   Medical history non-contributory    Past Surgical History:  Procedure Laterality Date   BACK SURGERY     FOOT SURGERY     INCISION AND DRAINAGE ABSCESS Left 03/29/2021   Procedure: INCISION AND DRAINAGE AXILLARY ABSCESS;  Surgeon: Griselda Miner, MD;  Location: [facility];  Service: General;  Laterality: Left;   Patient Active Problem List   Diagnosis Date Noted   Axillary abscess 03/30/2021    ONSET DATE: 11/06/22   REFERRING DIAG: W09.811 (ICD-10-CM) - Arterial ischemic stroke, MCA (middle cerebral artery), left, acute (HCC) Z74.09 (ICD-10-CM) - Other reduced mobility Z78.9 (ICD-10-CM) - Other specified health status 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 Yes, left leg - keeping her up at night  OBJECTIVE:   TODAY'S TREATMENT:   05/12/23: Kent's son, [name] has followed up getting [name] enrolled in an aphasia group and study at [facility] starting this month. They continue to report Xiomara is ordering a variety of  items at restaurants with min A from family. Targeted word finding and verbal apraxia in structured conversation re: adopting a puppy, shopping and recent events. Using  multimodal communication of white board (writing), speech and gestures, Marybella generated novel information in response to conversational questions. She required frequent mod A to ID and correct written responses 4x. Verbal apraxia and sentence generation targeted generating 2 sentences for 2 multiple meaning words. Elienai communicated 1 meaning for each word, which SLP shaped into personally relevant sentences. Tekela read sentence aloud with usual choral and placement cues 3x each. Eleah has not used the speaker phone, except to say "hello" She states she is not ready to have a phone conversation and does not want to work on this skill.   05/07/23: Frieda continues to demonstrate improving spontaneous speech, social greetings and conversation. She & Al report that she is ordering items using the menu to read them with success 60% of time. She ordered her water and wine last time they went out. Encouraged them to look up the menu and practice before hand. Targeted verbal apraxia, word finding and sentence generating Id'ing 2 meanings of multiple meaning words and generating 4-5 word sentences with each meaning. Nazaria required frequent mod verbal and written cues to generate 2 meanings for 10 words. She required consistent max verbal, questioning and written cues to generate 20 sentences. She verbalized 12/20 intelligibly with usual mod A, 8/20 unintelligible with consistent max A  04/28/23: Family continues to report improving spontaneous speech - Anayelli verbalized that she saw friends, went over to their place  and attempted to tell me they ate crab legs. She reqiured verbal cues and approximated "crab legs" 50% accuracy 5x. Targeted verbal apraxia and word finding and multimodal communication naming mid frequency photos and generating simple sentence with the word. Aleighna required consistent questioning cues to generate telegraphic phrase for each photo. She required usual mod A to name each photo. Consistent max written cues and  frequent choral reading to generate syntactically correct 4-6 word sentences. She approximated 65% of words 7/10 sentences. They are taking Saryah to the eye doctor. Trained Al on providing ophthalmology staff that due to aphasia, Alyce may mis read letters, which may not reflect her vision. He is aware to educate staff. Targeted reading letters on white board in anticipation of this visit, Amberli read 3 lines of 5 letters with 1 error which she questioning cue to correct "You said T" which she immediately corrected.   04/21/23: To target word finding, verbal apraxia and sentence generation,   Scientist, product/process development (VNeST) was utilized. The pt generated 3 subjects and objects for 3 verbs (hold, drink plant), for a total of 9 subject objects. Pt required usual mod to max verbal and written cues. Pt generated 3 complex sentences by answering "wh" questions. Pt required usual mod semantic  cues and choice of 2 to generate complex sentences. In conversation, multimodal communication targeted using SGD white board to write 3 places she went last week with her son with occasional min to mod A.   04/14/23: Targeted multimodal communication, verbal compensations for aphasia and word finding in complex naming task generating 3 similarities and 3 differences for related items. With structured visual chart and frequent mod questioning cues, written cues to aid auditory comprehension and written choice cues, Amand generated 3 similarities and differences for 3 related pairs (coffee and cola; police and Theatre stage manager; radio and TV). She read/approximated the similarities and differences with 70% accuracy with phonemic and placement cues. They wish to continue skilled ST - re-cert completed today - continue goals   04/09/23: Ongoing practice at sentence generation level using VNeST. Usual max-A required to generate 3 subject-verb-object sentences for 3 verbs, resulting in 9 total sentences. To produce accurately, use of  apraxia strategies and repetitive practice to improve accuracy was utilized. Pt continues to benefit from direct model and choral production while looking at partners mouth to aid in accuracy.    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 She is ordering items at restaurants and using multimodal communication successful with friends and family  Objective Measurements: spontaneous utterances, inconsistent, 5-7 word sentences improved from 0-2 word utterances with max A  Goal Update: Continue goals   Plan: Continue POC - expect 1-2 more visits then she will transition to an aphasia study at [facility]  Reason Skilled Services are Required: To maximize communication in conversation with multimodal communication to meet wants, needs and social interactions   GOALS: Goals reviewed with patient? Yes  SHORT TERM GOALS: Target date: 12/25/22  Pt will name 5 family members with occasional mod A Baseline: Goal status: MET  2.  Pt will answer personally relevant yes/no questions accurately 85% with multimodal communication Baseline:  Goal status: MET  3.  Pt will ID 5 food and shopping preferences using multimodal communication with occasional min A Baseline:  Goal status: MET  4.  Pt will name 6 items in personally relevant categories using AAC/multimodal communication as needed with occasional mod A Baseline:  Goal status: NOT MET  5.  Family/caregivers will carryover  3 communication strategies to support pt's comprehension and expression Baseline:  Goal status: MET   LONG TERM GOALS: Target date: 6/0/45 (re-cert)  Pt will initiate use mulitmodal communication to communicate wants/needs 3x over 1 week with usual min A from caregivers Baseline:  Goal status: MET  2.  Pt or caregivers will customize 4 items on AAC system with rare min A  Baseline:  Goal status: MET  3.  Pt will use multimodal communication to communicate 3 grocery items and 3 personal care items she needs with occasional min A from family Baseline:  Goal status: MET  4.  Pt will access online shopping site she uses and purchase a needed item with occasional  min A from family Baseline: ongoing goal for re-cert 03/17/80 Goal status: ONGOING   5.  Using compensatory strategies and multimodal communication, pt will take 3turns in conversation with occasional min A Baseline:  Goal status: MET  6. Pt will produce 3-5 word conversational phrases with frequent mod A in structured task Baseline: new goal for re-cert 1/91/47 Goal Status: ONGOING  7. Pt will order 1-2 items dining out using multimodal communication over 3 restaurant meals with occasional min A from family             Baseline: new goal for recert 02/17/23             Goal Status: MET  8. Gailyn will use appropriate conversational phrases over phone calls with her family 1-2x over 4 phone calls or face time with occasional min a from family                Baseline: new goal for re-cert 08/07/55                Goal Status: NOT MET       ASSESSMENT:  CLINICAL IMPRESSION: Renita has demonstrated good progress - she is using SGD with mod I, writing at word level to augment verbal communication. Moderate tp severe verbal apraxia persists, however Nayelli has progressed from producing 3 word utterances in structured tasks to producing 7-9 word utterances and social phrases with usual miod A. She continues to require max A to carryover  these phrases into spontaneous conversation. Aphasia has improved from severe to moderate. She remains frustrated with her difficulty communicating. Jeanae remains motivated and practices daily, carrying over all homework, using aphasia apps to practice. Improvement in length and frequency of spontaneous verbalizations in conversation continue, to length of 4 words occasionally.  I recommend she Continue skilled ST services to maximize verbal and non verbal communication for QOL, to reduce caregiver burden and life participation. Re-cert completed 04/14/23, with frequency reduced to 1x a week.   OBJECTIVE IMPAIRMENTS: include attention, memory, aphasia, and apraxia. These impairments are limiting patient from return to work, managing medications, managing appointments, managing finances, household responsibilities, ADLs/IADLs, and effectively communicating at home and in community. Factors affecting potential to achieve goals and functional outcome are severity of impairments. Patient will benefit from skilled SLP services to address above impairments and improve  overall function.  REHAB POTENTIAL: Good  PLAN:  SLP FREQUENCY: 1x a week  SLP DURATION: 24 weeks  PLANNED INTERVENTIONS: Language facilitation, Environmental controls, Cueing hierachy, Cognitive reorganization, Internal/external aids, Functional tasks, Multimodal communication approach, SLP instruction and feedback, Compensatory strategies, and Patient/family education   Shavaughn, Wentworth, CCC-SLP 05/12/2023, 2:23 PM

## 2023-05-19 ENCOUNTER — Encounter: Payer: Medicare Other | Admitting: Speech Pathology

## 2023-05-20 ENCOUNTER — Ambulatory Visit: Payer: Medicare Other | Admitting: Speech Pathology

## 2023-05-20 ENCOUNTER — Encounter: Payer: Self-pay | Admitting: Speech Pathology

## 2023-05-20 DIAGNOSIS — R4701 Aphasia: Secondary | ICD-10-CM

## 2023-05-20 DIAGNOSIS — R482 Apraxia: Secondary | ICD-10-CM

## 2023-05-20 NOTE — Therapy (Signed)
OUTPATIENT SPEECH LANGUAGE PATHOLOGY TREATMENT & DISCHARGE SUMMARY   Patient Name: Kelsey Jacobs MRN: [id number] DOB:07/16/1946, 77 y.o., female Today's Date: 05/20/2023  PCP: Alain Honey MD REFERRING PROVIDER: Basilia Jumbo, FNP  END OF SESSION:  End of Session - 05/20/23 1025     Visit Number 41    Number of Visits 53    Date for SLP Re-Evaluation 06/09/23    Authorization Type medicare    SLP Start Time 1015    SLP Stop Time  1100    SLP Time Calculation (min) 45 min    Activity Tolerance Patient tolerated treatment well                   Past Medical History:  Diagnosis Date   Medical history non-contributory    Past Surgical History:  Procedure Laterality Date   BACK SURGERY     FOOT SURGERY     INCISION AND DRAINAGE ABSCESS Left 03/29/2021   Procedure: INCISION AND DRAINAGE AXILLARY ABSCESS;  Surgeon: Griselda Miner, MD;  Location: [facility];  Service: General;  Laterality: Left;   Patient Active Problem List   Diagnosis Date Noted   Axillary abscess 03/30/2021    ONSET DATE: 11/06/22   REFERRING DIAG: A21.308 (ICD-10-CM) - Arterial ischemic stroke, MCA (middle cerebral artery), left, acute (HCC) Z74.09 (ICD-10-CM) - Other reduced mobility Z78.9 (ICD-10-CM) - Other specified health status 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 Yes, left leg - keeping her up at night  OBJECTIVE:   TODAY'S TREATMENT:   05/12/23: Kelsey Jacobs's son, Kelsey Jacobs has followed up getting Kelsey Jacobs enrolled in an aphasia group and study at [facility] starting this month. They continue to report Kelsey Jacobs is ordering a variety of  items at restaurants with min A from family. Targeted word finding and verbal apraxia in structured conversation re: adopting a puppy, shopping and  recent events. Using multimodal communication of white board (writing), speech and gestures, Kelsey Jacobs generated novel information in response to conversational questions. She required frequent mod A to ID and correct written responses 4x. Verbal apraxia and sentence generation targeted generating 2 sentences for 2 multiple meaning words. Kelsey Jacobs communicated 1 meaning for each word, which SLP shaped into personally relevant sentences. Kelsey Jacobs read sentence aloud with usual choral and placement cues 3x each. Kelsey Jacobs has not used the speaker phone, except to say "hello" She states she is not ready to have a phone conversation and does not want to work on this skill.   05/07/23: Kelsey Jacobs continues to demonstrate improving spontaneous speech, social greetings and conversation. She & Kelsey Jacobs report that she is ordering items using the menu to read them with success 60% of time. She ordered her water and wine last time they went out. Encouraged them to look up the menu and practice before hand. Targeted verbal apraxia, word finding and sentence generating Id'ing 2 meanings of multiple meaning words and generating 4-5 word sentences with each meaning. Kelsey Jacobs required frequent mod verbal and written cues to generate 2 meanings for 10 words. She required consistent max verbal, questioning and written cues to generate 20 sentences. She verbalized 12/20 intelligibly with usual mod A, 8/20 unintelligible with consistent max A  04/28/23: Family continues to report improving spontaneous speech - Kelsey Jacobs verbalized that she saw friends, went over  to their place and attempted to tell me they ate crab legs. She reqiured verbal cues and approximated "crab legs" 50% accuracy 5x. Targeted verbal apraxia and word finding and multimodal communication naming mid frequency photos and generating simple sentence with the word. Kelsey Jacobs required consistent questioning cues to generate telegraphic phrase for each photo. She required usual mod A to name each photo. Consistent max  written cues and frequent choral reading to generate syntactically correct 4-6 word sentences. She approximated 65% of words 7/10 sentences. They are taking Kelsey Jacobs to the eye doctor. Trained Kelsey Jacobs on providing ophthalmology staff that due to aphasia, [name] may mis read letters, which may not reflect her vision. He is aware to educate staff. Targeted reading letters on white board in anticipation of this visit, Kelsey Jacobs read 3 lines of 5 letters with 1 error which she questioning cue to correct "You said T" which she immediately corrected.   04/21/23: To target word finding, verbal apraxia and sentence generation,   Scientist, product/process development (VNeST) was utilized. The pt generated 3 subjects and objects for 3 verbs (hold, drink plant), for a total of 9 subject objects. Pt required usual mod to max verbal and written cues. Pt generated 3 complex sentences by answering "wh" questions. Pt required usual mod semantic  cues and choice of 2 to generate complex sentences. In conversation, multimodal communication targeted using SGD white board to write 3 places she went last week with her son with occasional min to mod A.   04/14/23: Targeted multimodal communication, verbal compensations for aphasia and word finding in complex naming task generating 3 similarities and 3 differences for related items. With structured visual chart and frequent mod questioning cues, written cues to aid auditory comprehension and written choice cues, [name] generated 3 similarities and differences for 3 related pairs (coffee and cola; police and Theatre stage manager; radio and TV). She read/approximated the similarities and differences with 70% accuracy with phonemic and placement cues. They wish to continue skilled ST - re-cert completed today - continue goals   04/09/23: Ongoing practice at sentence generation level using VNeST. Usual max-A required to generate 3 subject-verb-object sentences for 3 verbs, resulting in 9 total sentences. To produce  accurately, use of apraxia strategies and repetitive practice to improve accuracy was utilized. Pt continues to benefit from direct model and choral production while looking at partners mouth to aid in accuracy.    PATIENT EDUCATION: Education details: Sees today's treatment and patient instructions, aphasia ed Person educated: Patient, Spouse, and Child(ren) Education method: Explanation, Demonstration, Verbal cues, and Handouts Education comprehension: verbalized understanding, verbal cues required, and needs further education   SPEECH THERAPY DISCHARGE SUMMARY  Visits from Start of Care: 41  Current functional level related to goals / functional outcomes: See goals below   Remaining deficits: Moderate verbal apraxia; moderate non fluent aphasia   Education / Equipment: Compensations for aphasia, daily HEP, family cueing for aphasia   Patient agrees to discharge. Patient goals were met. Patient is being discharged due to meeting the stated rehab goals.Marland Kitchen     GOALS: Goals reviewed with patient? Yes  SHORT TERM GOALS: Target date: 12/25/22  Pt will name 5 family members with occasional mod A Baseline: Goal status: MET  2.  Pt will answer personally relevant yes/no questions accurately 85% with multimodal communication Baseline:  Goal status: MET  3.  Pt will ID 5 food and shopping preferences using multimodal communication with occasional min A Baseline:  Goal status: MET  4.  Pt will name 6 items in personally relevant categories using AAC/multimodal communication as needed with occasional mod A Baseline:  Goal status: NOT MET  5.  Family/caregivers will carryover 3 communication strategies to support pt's comprehension and expression Baseline:  Goal status: MET   LONG TERM GOALS: Target date: 12/14/08 (re-cert)  Pt will initiate use mulitmodal communication to communicate wants/needs 3x over 1 week with usual min A from caregivers Baseline:  Goal status:  MET  2.  Pt or caregivers will customize 4 items on AAC system with rare min A  Baseline:  Goal status: MET  3.  Pt will use multimodal communication to communicate 3 grocery items and 3 personal care items she needs with occasional min A from family Baseline:  Goal status: MET  4.  Pt will access online shopping site she uses and purchase a needed item with occasional  min A from family Baseline: ongoing goal for re-cert 9/60/45 Goal status: MET  5.  Using compensatory strategies and multimodal communication, pt will take 3turns in conversation with occasional min A Baseline:  Goal status: MET  6. Pt will produce 3-5 word conversational phrases with frequent mod A in structured task Baseline: new goal for re-cert 03/17/80 Goal Status: MEET  7. Pt will order 1-2 items dining out using multimodal communication over 3 restaurant meals with occasional min A from family             Baseline: new goal for recert 02/17/23             Goal Status: MET  8. Kerry-Anne will use appropriate conversational phrases over phone calls with her family 1-2x over 4 phone calls or face time with occasional min a from family                Baseline: new goal for re-cert 1/91/47                Goal Status: PARTIALLY MET       ASSESSMENT:  CLINICAL IMPRESSION: Lounette has demonstrated good progress - she is using SGD with mod I, writing at word level to augment verbal communication. Moderate tp severe verbal apraxia persists, however Nateesha has progressed from producing 3 word utterances in structured tasks to producing 7-9 word utterances and social phrases with usual mod A. Andreal has progressed to ordering some items at restaurants, participating in simple interactions with friends and family. She is communicating via phone for approximately 2 minute conversations. She is generating short 2-3 word rote responses to texts.  Improvement in length and frequency of spontaneous verbalizations in conversation continue, to  length of 4 words occasionally.  At this time, goals are met, I recommend d/c ST. Oletha has been accepted into an aphasia study at [facility] over the next 6-7 weeks.   OBJECTIVE IMPAIRMENTS: include attention, memory, aphasia, and apraxia. These impairments are limiting patient from return to work, managing medications, managing appointments, managing finances, household responsibilities, ADLs/IADLs, and effectively communicating at home and in community. Factors affecting potential to achieve goals and functional outcome are severity of impairments. Patient will benefit from skilled SLP services to address above impairments and improve overall function.  REHAB POTENTIAL: Good  PLAN:  SLP FREQUENCY: 1x a week  SLP DURATION: 24 weeks  PLANNED INTERVENTIONS: Language facilitation, Environmental controls, Cueing hierachy, Cognitive reorganization, Internal/external aids, Functional tasks, Multimodal communication approach, SLP instruction and feedback, Compensatory strategies, and Patient/family education   Alice Reichert Drucella Och, CCC-SLP 05/20/2023, 12:53 PM

## 2023-08-01 DIAGNOSIS — M48062 Spinal stenosis, lumbar region with neurogenic claudication: Secondary | ICD-10-CM | POA: Insufficient documentation

## 2023-08-01 DIAGNOSIS — Z9889 Other specified postprocedural states: Secondary | ICD-10-CM | POA: Insufficient documentation

## 2023-08-25 ENCOUNTER — Ambulatory Visit: Payer: Medicare Other | Attending: Nurse Practitioner | Admitting: Speech Pathology

## 2023-08-25 ENCOUNTER — Encounter: Payer: Self-pay | Admitting: Speech Pathology

## 2023-08-25 ENCOUNTER — Other Ambulatory Visit: Payer: Self-pay

## 2023-08-25 DIAGNOSIS — R4701 Aphasia: Secondary | ICD-10-CM | POA: Insufficient documentation

## 2023-08-25 DIAGNOSIS — R482 Apraxia: Secondary | ICD-10-CM | POA: Insufficient documentation

## 2023-08-25 NOTE — Therapy (Signed)
OUTPATIENT SPEECH LANGUAGE PATHOLOGY APHASIA EVALUATION   Patient Name: Kelsey Jacobs MRN: [id number] DOB:02-16-1946, 77 y.o., female Today's Date: 08/25/2023  PCP: Kelsey Honey MD REFERRING PROVIDER: Basilia Jumbo, FNP  END OF SESSION:  End of Session - 08/25/23 1324     Visit Number 1    Number of Visits 25    Date for SLP Re-Evaluation 11/17/23    Authorization Type medicare    SLP Start Time 1318    SLP Stop Time  1400    SLP Time Calculation (min) 42 min    Activity Tolerance Patient tolerated treatment well             Past Medical History:  Diagnosis Date   Medical history non-contributory    Past Surgical History:  Procedure Laterality Date   BACK SURGERY     FOOT SURGERY     INCISION AND DRAINAGE ABSCESS Left 03/29/2021   Procedure: INCISION AND DRAINAGE AXILLARY ABSCESS;  Surgeon: Griselda Miner, MD;  Location: [facility];  Service: General;  Laterality: Left;   Patient Active Problem List   Diagnosis Date Noted   Axillary abscess 03/30/2021    ONSET DATE: 07/23/2023(referral date)   REFERRING DIAG: K44.010 (ICD-10-CM) - Arterial ischemic stroke, MCA (middle cerebral artery), left, acute (HCC) Z74.09 (ICD-10-CM) - Other reduced mobility Z78.9 (ICD-10-CM) - Other specified health status I69.320 (ICD-10-CM) - Aphasia following cerebral infarction  THERAPY DIAG:  Aphasia  Verbal apraxia  Rationale for Evaluation and Treatment: Rehabilitation  SUBJECTIVE:   SUBJECTIVE STATEMENT: "I just don't squezy (aphasia) "She gets frustrated Pt accompanied by: significant other Kelsey Jacobs  PERTINENT HISTORY: Pt well known to [place] from prior course of therapy s/p CVA 11/08/22. She returns due to significant frustration with aphasia affecting communication. 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 Second stroke. She received IPR at [facility]. Prior working as Advertising account planner, independent.  PAIN:  Are you having pain? No  FALLS: Has patient fallen in  last 6 months?  No  LIVING ENVIRONMENT: Lives with: lives with their spouse Lives in: House/apartment  PLOF:  Level of assistance: Independent with ADLs Employment: Retired  PATIENT GOALS: "I don't know"  OBJECTIVE:    COGNITION: Overall cognitive status: Within functional limits for tasks assessed  AUDITORY COMPREHENSION: Overall auditory comprehension: Impaired: moderately complex YES/NO questions: Impaired: simple Following directions: Impaired: simple Conversation: Simple Interfering components: motor planning Effective technique: extra processing time, pausing, repetition/stressing words, slowed speech, written cues, and visual/gestural cues  READING COMPREHENSION: Impaired: phrase  EXPRESSION: verbal and augmentative device  VERBAL EXPRESSION: Level of generative/spontaneous verbalization: phrase Automatic speech: counting: intact, day of week: intact, and month of year: impaired  Repetition: Impaired: Word Naming: Confrontation: 0-25% and Divergent: 0-25% Pragmatics: Appears intact Comments: poor frustration tolerance Interfering components:  frustration tolerance Effective technique: semantic cues, sentence completion, phonemic cues, and written cues Non-verbal means of communication: communication board and writing key word 25% successful per spouse  WRITTEN EXPRESSION: Dominant hand: right Written expression: Impaired: word  MOTOR SPEECH: Overall motor speech: impaired Level of impairment: Word Respiration: thoracic breathing Phonation: normal Resonance: WFL Articulation: Appears intact Intelligibility: Intelligible Motor planning: Impaired: aware, groping for words, and inconsistent Motor speech errors: aware, groping for words, and inconsistent Interfering components:  frustration tolerance Effective technique: slow rate  ORAL MOTOR EXAMINATION: Overall status: WFL Comments:   STANDARDIZED ASSESSMENTS: QAB: Severe 3.44/10     TODAY'S  TREATMENT:  DATE:   08/25/23 (eval day): reviewed goals and frustration tolerance affecting verbal expression   PATIENT EDUCATION: Education details: See Today's Treatment; See patient instructions Person educated: Patient and Spouse Education method: Explanation and Verbal cues Education comprehension: verbalized understanding, verbal cues required, and needs further education   GOALS: Goals reviewed with patient? Yes  SHORT TERM GOALS: Target date: 09/22/23  Pt will name 7 items in personally relevant category with occasional min A Baseline: 2 on eval with semantic cues Goal status: INITIAL  2.  Pt will use strategies to complete structured language task despite frustration with occasional min A Baseline: aborts tasks Goal status: INITIAL  3.  Pt will write a word level with 80% accuracy and occasional min A  Baseline:  Goal status: INITIAL  4.  Pt will ID errors in written word and self correct or write an alternative key word to successfully augment verbal message with occasional min A Baseline:  Goal status: INITIAL  5. Pt and caregivers will use semantic feature analysis strategies to support and repair communication breakdowns in written attempts to augment verbal communication with usual mod A  LONG TERM GOALS: Target date: 11/17/23  Pt will use multimodal communication to make wants/needs/questions known 4/5 attempts with occasional min A Baseline:  Goal status: INITIAL  2.  Pt will use strategies to manage frustration during aphasic/apraxic errors to complete message rather than aborting and  crying 4/5 attempts with occasional min A Baseline:  Goal status: INITIAL  3.  Pt will generate simple sentences with compensatory strategies 3/4 turns in conversation with occasional min A Baseline:  Goal status: INITIAL  4.  Pt will ID  error and correct or adapt written communication when breakdown occurs with occasional min A Baseline:  Goal status: INITIAL   ASSESSMENT:  CLINICAL IMPRESSION: Patient is a 77 y.o. female who was seen today for severe aphasia and verbal apraxia. She has a Publishing copy Talk which she uses occasionally. [name] uses written key word to augment verbal communication which spouse rates as 25% successful. Throughout evaluation, Moraima repeatedly would become tearful, shake her fists, repeat "IDK" or "I can't," aborting the task or her message. Poor frustration tolerance impedes word finding and speech. On confrontation writing task, Loneta would say a word, then write a different but related word. For example, when shown a cup, she stated "water" but wrote "glasses" When shown a ring, she said ring, wrote "finger" when asked to correct this she wrote "ringer" She wrote "shoemaker" for "shoe." Errors without awareness. As Lorre relies on written key words to augment her verbal communication, she would benefit from improving and correcting written words. She would also benefit from strategies to reduce frustration which results in her becoming tearful and/or aborting her message or therapy task. I recommend skilled ST to maximize multimodal communication for safety independence and QOL as well as reduce caregiver burden. .   OBJECTIVE IMPAIRMENTS: include aphasia and apraxia. These impairments are limiting patient from return to work, household responsibilities, and effectively communicating at home and in community. Factors affecting potential to achieve goals and functional outcome are severity of impairments. Patient will benefit from skilled SLP services to address above impairments and improve overall function.  REHAB POTENTIAL: Good  PLAN:  SLP FREQUENCY: 2x/week  SLP DURATION: 12 weeks  PLANNED INTERVENTIONS: Language facilitation, Environmental controls, Cueing hierachy, Cognitive reorganization,  Internal/external aids, Functional tasks, and Multimodal communication approach    Aminta Sakurai, Genet Fischl, CCC-SLP 08/25/2023, 1:24 PM

## 2023-09-01 ENCOUNTER — Ambulatory Visit: Payer: Medicare Other | Admitting: Speech Pathology

## 2023-09-01 DIAGNOSIS — R482 Apraxia: Secondary | ICD-10-CM

## 2023-09-01 DIAGNOSIS — R4701 Aphasia: Secondary | ICD-10-CM | POA: Diagnosis not present

## 2023-09-01 NOTE — Patient Instructions (Signed)
   When you are talking or writing and having trouble - use more than one word - write other key words or clues  You will have to manage your frustration - frustration affects communication almost as much as the aphasia  Keep a list of words/topics you are not successful communicating - this may come out after the fact  Family - encourage her to say or write more - she also benefits from written cues

## 2023-09-01 NOTE — Therapy (Signed)
OUTPATIENT SPEECH LANGUAGE PATHOLOGY APHASIA TREATMENT   Patient Name: Kelsey Jacobs MRN: [id number] DOB:Nov 12, 1946, 77 y.o., female Today's Date: 09/01/2023  PCP: Alain Honey MD REFERRING PROVIDER: Basilia Jumbo, FNP  END OF SESSION:  End of Session - 09/01/23 0935     Visit Number 2    Number of Visits 25    Date for SLP Re-Evaluation 11/17/23    Authorization Type medicare    SLP Start Time 0845    SLP Stop Time  0930    SLP Time Calculation (min) 45 min    Activity Tolerance Patient tolerated treatment well              Past Medical History:  Diagnosis Date   Medical history non-contributory    Past Surgical History:  Procedure Laterality Date   BACK SURGERY     FOOT SURGERY     INCISION AND DRAINAGE ABSCESS Left 03/29/2021   Procedure: INCISION AND DRAINAGE AXILLARY ABSCESS;  Surgeon: Griselda Miner, MD;  Location: [facility];  Service: General;  Laterality: Left;   Patient Active Problem List   Diagnosis Date Noted   Axillary abscess 03/30/2021    ONSET DATE: 07/23/2023(referral date)   REFERRING DIAG: W09.811 (ICD-10-CM) - Arterial ischemic stroke, MCA (middle cerebral artery), left, acute (HCC) Z74.09 (ICD-10-CM) - Other reduced mobility Z78.9 (ICD-10-CM) - Other specified health status I69.320 (ICD-10-CM) - Aphasia following cerebral infarction  THERAPY DIAG:  Aphasia  Verbal apraxia  Rationale for Evaluation and Treatment: Rehabilitation  SUBJECTIVE:   SUBJECTIVE STATEMENT: "It's early" Pt accompanied by: significant other Al  PERTINENT HISTORY: Pt well known to [place] from prior course of therapy s/p CVA 11/08/22. She returns due to significant frustration with aphasia affecting communication. 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 Second stroke. She received IPR at [facility]. Prior working as Advertising account planner, independent.  PAIN:  Are you having pain? No  FALLS: Has patient fallen in last 6 months?  No  LIVING  ENVIRONMENT: Lives with: lives with their spouse Lives in: House/apartment  PLOF:  Level of assistance: Independent with ADLs Employment: Retired  PATIENT GOALS: "I don't know"  OBJECTIVE:    STANDARDIZED ASSESSMENTS: QAB: Severe 3.44/10     TODAY'S TREATMENT:                                                                                                                                         DATE:   09/01/23: Targeted frustration management and reducing response "I don't know" during word finding difficulty. With occasional min verbal cues and redirection, Kandee maintained topic/task  despite communication breakdown and frustration. With frequent mod written and verbal questioning cues, she generated (written or verbal) 3-4 key words on structured task. Al suggested topic of dinner. For words Steak, Pork, Chicken, Salvino[employer], Effie Shy generated 4 written key words. Semantic Feature Analysis chart introduced, however Jeannie benefits more from  numerical written 1., 2., 3., 4., ... Cues to list key words. Zhoe also benefits from written cues of "wh" words to generate cues or foil words, for example "Restaurant?" To generate "Store" as a key word for Belk. Demonstrated and modeled for Al use of these written cues to support Annasophia's comprehension.   08/25/23 (eval day): reviewed goals and frustration tolerance affecting verbal expression   PATIENT EDUCATION: Education details: See Today's Treatment; See patient instructions Person educated: Patient and Spouse Education method: Explanation and Verbal cues Education comprehension: verbalized understanding, verbal cues required, and needs further education   GOALS: Goals reviewed with patient? Yes  SHORT TERM GOALS: Target date: 09/22/23  Pt will name 7 items in personally relevant category with occasional min A Baseline: 2 on eval with semantic cues Goal status: ONGOING  2.  Pt will use strategies to complete structured language task  despite frustration with occasional min A Baseline: aborts tasks Goal status: ONGOING  3.  Pt will write a word level with 80% accuracy and occasional min A  Baseline:  Goal status: ONGOING  4.  Pt will ID errors in written word and self correct or write an alternative key word to successfully augment verbal message with occasional min A Baseline:  Goal status:ONGING  5. Pt and caregivers will use semantic feature analysis strategies to support and repair communication breakdowns in written attempts to augment verbal communication with usual mod A           Goal Status: ONGOING  LONG TERM GOALS: Target date: 11/17/23  Pt will use multimodal communication to make wants/needs/questions known 4/5 attempts with occasional min A Baseline:  Goal status: ONGOING  2.  Pt will use strategies to manage frustration during aphasic/apraxic errors to complete message rather than aborting and  crying 4/5 attempts with occasional min A Baseline:  Goal status: ONGOING  3.  Pt will generate simple sentences with compensatory strategies 3/4 turns in conversation with occasional min A Baseline:  Goal status: ONGOING  4.  Pt will ID error and correct or adapt written communication when breakdown occurs with occasional min A Baseline:  Goal status: ONGOING   ASSESSMENT:  CLINICAL IMPRESSION: Patient is a 77 y.o. female who was seen today for severe aphasia and verbal apraxia. She has a Publishing copy Talk which she uses occasionally. Aysia uses written key word to augment verbal communication which spouse rates as 25% successful. Throughout evaluation, Ariyanah repeatedly would become tearful, shake her fists, repeat "IDK" or "I can't," aborting the task or her message. Poor frustration tolerance impedes word finding and speech. On confrontation writing task, Doncella would say a word, then write a different but related word. For example, when shown a cup, she stated "water" but wrote "glasses" When shown a  ring, she said ring, wrote "finger" when asked to correct this she wrote "ringer" She wrote "shoemaker" for "shoe." Errors without awareness. As Tyshonna relies on written key words to augment her verbal communication, she would benefit from improving and correcting written words. She would also benefit from strategies to reduce frustration which results in her becoming tearful and/or aborting her message or therapy task. I recommend skilled ST to maximize multimodal communication for safety independence and QOL as well as reduce caregiver burden. .   OBJECTIVE IMPAIRMENTS: include aphasia and apraxia. These impairments are limiting patient from return to work, household responsibilities, and effectively communicating at home and in community. Factors affecting potential to achieve goals and functional outcome are severity  of impairments. Patient will benefit from skilled SLP services to address above impairments and improve overall function.  REHAB POTENTIAL: Good  PLAN:  SLP FREQUENCY: 2x/week  SLP DURATION: 12 weeks  PLANNED INTERVENTIONS: Language facilitation, Environmental controls, Cueing hierachy, Cognitive reorganization, Internal/external aids, Functional tasks, and Multimodal communication approach    Anye Brose, Shyasia Zermeno, CCC-SLP 09/01/2023, 9:57 AM

## 2023-09-10 ENCOUNTER — Encounter: Payer: Self-pay | Admitting: Speech Pathology

## 2023-09-10 ENCOUNTER — Ambulatory Visit: Payer: Medicare Other | Attending: Nurse Practitioner | Admitting: Speech Pathology

## 2023-09-10 DIAGNOSIS — R4701 Aphasia: Secondary | ICD-10-CM | POA: Insufficient documentation

## 2023-09-10 DIAGNOSIS — R482 Apraxia: Secondary | ICD-10-CM | POA: Diagnosis present

## 2023-09-10 NOTE — Therapy (Signed)
OUTPATIENT SPEECH LANGUAGE PATHOLOGY APHASIA TREATMENT   Patient Name: Kelsey Jacobs MRN: [id number] DOB:10-01-1946, 77 y.o., female Today's Date: 09/10/2023  PCP: Alain Honey MD REFERRING PROVIDER: Basilia Jumbo, FNP  END OF SESSION:  End of Session - 09/10/23 1100     Visit Number 3    Number of Visits 25    Date for SLP Re-Evaluation 11/17/23    Authorization Type medicare    SLP Start Time 1100    SLP Stop Time  1145    SLP Time Calculation (min) 45 min    Activity Tolerance Patient tolerated treatment well              Past Medical History:  Diagnosis Date   Medical history non-contributory    Past Surgical History:  Procedure Laterality Date   BACK SURGERY     FOOT SURGERY     INCISION AND DRAINAGE ABSCESS Left 03/29/2021   Procedure: INCISION AND DRAINAGE AXILLARY ABSCESS;  Surgeon: Griselda Miner, MD;  Location: [facility];  Service: General;  Laterality: Left;   Patient Active Problem List   Diagnosis Date Noted   Axillary abscess 03/30/2021    ONSET DATE: 07/23/2023(referral date)   REFERRING DIAG: D66.440 (ICD-10-CM) - Arterial ischemic stroke, MCA (middle cerebral artery), left, acute (HCC) Z74.09 (ICD-10-CM) - Other reduced mobility Z78.9 (ICD-10-CM) - Other specified health status I69.320 (ICD-10-CM) - Aphasia following cerebral infarction  THERAPY DIAG:  Aphasia  Verbal apraxia  Rationale for Evaluation and Treatment: Rehabilitation  SUBJECTIVE:   SUBJECTIVE "I had to stop, she got upset" re: semantic feature homework Pt accompanied by: significant other Kelsey Jacobs  PERTINENT HISTORY: Pt well known to [place] from prior course of therapy s/p CVA 11/08/22. She returns due to significant frustration with aphasia affecting communication. 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 Second stroke. She received IPR at [facility]. Prior working as Advertising account planner, independent.  PAIN:  Are you having pain? No  FALLS: Has patient fallen in last  6 months?  No  LIVING ENVIRONMENT: Lives with: lives with their spouse Lives in: House/apartment  PLOF:  Level of assistance: Independent with ADLs Employment: Retired  PATIENT GOALS: "I don't know"  OBJECTIVE:    STANDARDIZED ASSESSMENTS: QAB: Severe 3.44/10     TODAY'S TREATMENT:                                                                                                                                         DATE:   09/10/23: Some semantic feature analysis completed, however frustration leads to stopping the work at home. Targeted verbal/written compensations for aphasia in structured conversation. Unable to say "[place]" she approximated it written - with verbal cues to write 3 words/cue about [place], she wrote "sand" and "[place]" independently. To communicate what he son brought her back from his trip, she wrote [employer] - with questioning cues, including foil questions to  augment auditory comprehension she wrote [place] and "Spgne" for sponge - Foil questions:"is it a fish" "is it a rock" to [employer] in writing/saying salient cues/words. Instructed Kelsey Jacobs to use foil questions at home if she is not understanding his "wh" questions he is using to facilitate her using related words as cues to get her message out. 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 She frequently verbalized "I don't know" with frustration and tearfulness through out these tasks, however with encouragement she persisted until the information was communicated. Kelsey Jacobs is not using writing to communicate with her sisters or friends. Kelsey Jacobs endorses he is carrying the conversations for her with family/friends. Kelsey Jacobs as a person she could try writing on white board to augment conversation. I requested Thayer Jacobs attend a session if able. We discussed goal  would be to use white board to augment verbal communication outside of just spouse/son.   09/01/23: Targeted frustration management and reducing response "I don't know" during word finding difficulty. With occasional min verbal cues and redirection, Kelsey Jacobs maintained topic/task  despite communication breakdown and frustration. With frequent mod written and verbal questioning cues, she generated (written or verbal) 3-4 key words on structured task. Kelsey Jacobs suggested topic of dinner. For words Steak, Pork, Chicken, [employer], Effie Shy generated 4 written key words. Semantic Feature Analysis chart introduced, however Teddi benefits more from numerical written 1., 2., 3., 4., ... Cues to list key words. Calirose also benefits from written cues of "wh" words to generate cues or foil words, for example "Restaurant?" To generate "Store" as a key word for Belk. Demonstrated and modeled for Kelsey Jacobs use of these written cues to support Mahasin's comprehension.   08/25/23 (eval day): reviewed goals and frustration tolerance affecting verbal expression   PATIENT EDUCATION: Education details: See Today's Treatment; See patient instructions Person educated: Patient and Spouse Education method: Explanation and Verbal cues Education comprehension: verbalized understanding, verbal cues required, and needs further education   GOALS: Goals reviewed with patient? Yes  SHORT TERM GOALS: Target date: 09/22/23  Pt will name 7 items in personally relevant category with occasional min A Baseline: 2 on eval with semantic cues Goal status: ONGOING  2.  Pt will use strategies to complete structured language task despite frustration with occasional min A Baseline: aborts tasks Goal status: ONGOING  3.  Pt will write a word level with 80% accuracy and occasional min A  Baseline:  Goal status: ONGOING  4.  Pt will ID errors in written word and self correct or write an alternative key word to successfully augment verbal message with  occasional min A Baseline:  Goal status:ONGING  5. Pt and caregivers will use semantic feature analysis strategies to support and repair communication breakdowns in written attempts to augment verbal communication with usual mod A           Goal Status: ONGOING  LONG TERM GOALS: Target date: 11/17/23  Pt will use multimodal communication to make wants/needs/questions known 4/5 attempts with occasional min A Baseline:  Goal status: ONGOING  2.  Pt will use strategies to manage frustration during aphasic/apraxic errors to complete message rather than aborting and  crying 4/5 attempts with occasional min A Baseline:  Goal status: ONGOING  3.  Pt will generate simple sentences with compensatory strategies 3/4 turns in conversation with occasional min  A Baseline:  Goal status: ONGOING  4.  Pt will ID error and correct or adapt written communication when breakdown occurs with occasional min A Baseline:  Goal status: ONGOING   ASSESSMENT:  CLINICAL IMPRESSION: Patient is a 77 y.o. female who was seen today for severe aphasia and verbal apraxia. She has a Publishing copy Talk which she uses occasionally. Avalina uses written key word to augment verbal communication which spouse rates as 25% successful. Throughout evaluation, Valissa repeatedly would become tearful, shake her fists, repeat "IDK" or "I can't," aborting the task or her message. Poor frustration tolerance impedes word finding and speech. On confrontation writing task, Mairely would say a word, then write a different but related word. For example, when shown a cup, she stated "water" but wrote "glasses" When shown a ring, she said ring, wrote "finger" when asked to correct this she wrote "ringer" She wrote "shoemaker" for "shoe." Errors without awareness. As Dories relies on written key words to augment her verbal communication, she would benefit from improving and correcting written words. She would also benefit from strategies to reduce  frustration which results in her becoming tearful and/or aborting her message or therapy task. I recommend skilled ST to maximize multimodal communication for safety independence and QOL as well as reduce caregiver burden. .   OBJECTIVE IMPAIRMENTS: include aphasia and apraxia. These impairments are limiting patient from return to work, household responsibilities, and effectively communicating at home and in community. Factors affecting potential to achieve goals and functional outcome are severity of impairments. Patient will benefit from skilled SLP services to address above impairments and improve overall function.  REHAB POTENTIAL: Good  PLAN:  SLP FREQUENCY: 2x/week  SLP DURATION: 12 weeks  PLANNED INTERVENTIONS: Language facilitation, Environmental controls, Cueing hierachy, Cognitive reorganization, Internal/external aids, Functional tasks, and Multimodal communication approach    Ege Muckey, Ashtan Paynter, CCC-SLP 09/10/2023, 12:00 PM

## 2023-09-15 ENCOUNTER — Ambulatory Visit: Payer: Medicare Other | Admitting: Speech Pathology

## 2023-09-15 ENCOUNTER — Encounter: Payer: Self-pay | Admitting: Speech Pathology

## 2023-09-15 DIAGNOSIS — R482 Apraxia: Secondary | ICD-10-CM

## 2023-09-15 DIAGNOSIS — R4701 Aphasia: Secondary | ICD-10-CM

## 2023-09-15 NOTE — Therapy (Signed)
OUTPATIENT SPEECH LANGUAGE PATHOLOGY APHASIA TREATMENT   Patient Name: Kelsey Jacobs MRN: [id number] DOB:15-Sep-1946, 77 y.o., female Today's Date: 09/15/2023  PCP: Alain Honey MD REFERRING PROVIDER: Basilia Jumbo, FNP  END OF SESSION:  End of Session - 09/15/23 1056     Visit Number 4    Number of Visits 25    Date for SLP Re-Evaluation 11/17/23    Authorization Type medicare    SLP Start Time 1100    SLP Stop Time  1145    SLP Time Calculation (min) 45 min    Activity Tolerance Patient tolerated treatment well              Past Medical History:  Diagnosis Date   Medical history non-contributory    Past Surgical History:  Procedure Laterality Date   BACK SURGERY     FOOT SURGERY     INCISION AND DRAINAGE ABSCESS Left 03/29/2021   Procedure: INCISION AND DRAINAGE AXILLARY ABSCESS;  Surgeon: Griselda Miner, MD;  Location: [facility];  Service: General;  Laterality: Left;   Patient Active Problem List   Diagnosis Date Noted   Axillary abscess 03/30/2021    ONSET DATE: 07/23/2023(referral date)   REFERRING DIAG: Q65.784 (ICD-10-CM) - Arterial ischemic stroke, MCA (middle cerebral artery), left, acute (HCC) Z74.09 (ICD-10-CM) - Other reduced mobility Z78.9 (ICD-10-CM) - Other specified health status I69.320 (ICD-10-CM) - Aphasia following cerebral infarction  THERAPY DIAG:  Aphasia  Verbal apraxia  Rationale for Evaluation and Treatment: Rehabilitation  SUBJECTIVE:   SUBJECTIVE "I don't know what a foil is" re: semantic feature homework Pt accompanied by: family member son Kelsey Jacobs  PERTINENT HISTORY: Pt well known to [place] from prior course of therapy s/p CVA 11/08/22. She returns due to significant frustration with aphasia affecting communication. 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 Second stroke. She received IPR at [facility]. Prior working as Advertising account planner, independent.  PAIN:  Are you having pain? No  FALLS: Has patient fallen in last  6 months?  No  LIVING ENVIRONMENT: Lives with: lives with their spouse Lives in: House/apartment  PLOF:  Level of assistance: Independent with ADLs Employment: Retired  PATIENT GOALS: "I don't know"  OBJECTIVE:    STANDARDIZED ASSESSMENTS: QAB: Severe 3.44/10     TODAY'S TREATMENT:                                                                                                                                         DATE:   09/15/23: Lorre's son  Kelsey Jacobs attended session today. Trained Merchant navy officer and Lavonn in use of multimodal communication with compensations for aphasia - used visual cue of numbering 1-4 to assist Kelsey Jacobs in organizing written cues for aphasia. In structured task using mid to low frequency photos and SFA (semantic feature analysis) Marinna required frequent mod verbal, questioning, written and foil cues to generate verbally or written, 3 related  words/cues for 4 photos. Demonstrated use of foil questions to A with auditory comprehension and semantic cues. When Dolce attempted gestures, modeling and feedback was required to mold a more salient gesture. She used gesture for "pyramid" and "harp" Encouraged Kelsey Jacobs to accept gestures drawing verbal and written communication in conversations. In structured conversation re: where she is taking her vacation and what she will do there, Kelsey Jacobs successfully wrote town and state name, she required questioning cues to generate 3 things she will do for example, "will they cook every night?" To generate names of 2 restaurants she will eat at. Kelsey Jacobs demonstrated use of foil questions to A in eliciting salient compensations for aphasia as well as use of written cues to support Kelsey Jacobs's auditory comprehension. Encouraged him to use written cues when Kelsey Jacobs doesn't understand his questions or topics in conversation. Educated Kelsey Jacobs that goal is for Shakida to use written/multimodal communication with a variety of communication partners. Geralene's sister plans to attend a session as well.    09/10/23: Some semantic feature analysis completed, however frustration leads to stopping the work at home. Targeted verbal/written compensations for aphasia in structured conversation. Unable to say "[place]" she approximated it written - with verbal cues to write 3 words/cue about [place], she wrote "sand" and "[place]" independently. To communicate what he son brought her back from his trip, she wrote SCBAA/SCUBA - with questioning cues, including foil questions to augment auditory comprehension she wrote [place] and "Spgne" for sponge - Foil questions:"is it a fish" "is it a rock" to [employer] in writing/saying salient cues/words. Instructed Kelsey Jacobs to use foil questions at home if she is not understanding his "wh" questions he is using to facilitate her using related words as cues to get her message out. 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 She frequently verbalized "I don't know" with frustration and tearfulness through out these tasks, however with encouragement she persisted until the information was communicated. Kelsey Jacobs is not using writing to communicate with her sisters or friends. Kelsey Jacobs endorses he is carrying the conversations for her with family/friends. Kelsey Jacobs Id'd her sister Kelsey Jacobs as a person she could try writing on white board to augment conversation. I requested Kelsey Jacobs attend a session if able. We discussed goal would be to use white board to augment verbal communication outside of just spouse/son.   09/01/23: Targeted frustration management and reducing response "I don't know" during word finding difficulty. With occasional min verbal cues and redirection, Kelsey Jacobs maintained topic/task  despite communication breakdown and frustration. With frequent mod written and verbal questioning cues, she generated (written or verbal) 3-4 key words on structured task.  Kelsey Jacobs suggested topic of dinner. For words Steak, Pork, Chicken, [employer], Effie Shy generated 4 written key words. Semantic Feature Analysis chart introduced, however Alaynna benefits more from numerical written 1., 2., 3., 4., ... Cues to list key words. Mirabel also benefits from written cues of "wh" words to generate cues or foil words, for example "Restaurant?" To generate "Store" as a key word for Belk. Demonstrated and modeled for Kelsey Jacobs use of these written cues to support Chia's comprehension.   08/25/23 (eval day): reviewed goals and frustration tolerance affecting verbal expression   PATIENT EDUCATION: Education details: See Today's Treatment; See patient instructions Person educated: Patient and Spouse Education method: Explanation and Verbal cues Education comprehension: verbalized understanding, verbal cues required,  and needs further education   GOALS: Goals reviewed with patient? Yes  SHORT TERM GOALS: Target date: 09/22/23  Pt will name 7 items in personally relevant category with occasional min A Baseline: 2 on eval with semantic cues Goal status: ONGOING  2.  Pt will use strategies to complete structured language task despite frustration with occasional min A Baseline: aborts tasks Goal status: ONGOING  3.  Pt will write a word level with 80% accuracy and occasional min A  Baseline:  Goal status: ONGOING  4.  Pt will ID errors in written word and self correct or write an alternative key word to successfully augment verbal message with occasional min A Baseline:  Goal status:ONGING  5. Pt and caregivers will use semantic feature analysis strategies to support and repair communication breakdowns in written attempts to augment verbal communication with usual mod A           Goal Status: ONGOING  LONG TERM GOALS: Target date: 11/17/23  Pt will use multimodal communication to make wants/needs/questions known 4/5 attempts with occasional min A Baseline:  Goal status: ONGOING  2.   Pt will use strategies to manage frustration during aphasic/apraxic errors to complete message rather than aborting and  crying 4/5 attempts with occasional min A Baseline:  Goal status: ONGOING  3.  Pt will generate simple sentences with compensatory strategies 3/4 turns in conversation with occasional min A Baseline:  Goal status: ONGOING  4.  Pt will ID error and correct or adapt written communication when breakdown occurs with occasional min A Baseline:  Goal status: ONGOING   ASSESSMENT:  CLINICAL IMPRESSION: Patient is a 77 y.o. female who was seen today for severe aphasia and verbal apraxia. She has a Publishing copy Talk which she uses occasionally. Elleigh uses written key word to augment verbal communication which spouse rates as 25% successful. Throughout evaluation, Shanequa repeatedly would become tearful, shake her fists, repeat "IDK" or "I can't," aborting the task or her message. Poor frustration tolerance impedes word finding and speech. On confrontation writing task, Kerstan would say a word, then write a different but related word. For example, when shown a cup, she stated "water" but wrote "glasses" When shown a ring, she said ring, wrote "finger" when asked to correct this she wrote "ringer" She wrote "shoemaker" for "shoe." Errors without awareness. As Miyona relies on written key words to augment her verbal communication, she would benefit from improving and correcting written words. She would also benefit from strategies to reduce frustration which results in her becoming tearful and/or aborting her message or therapy task. I recommend skilled ST to maximize multimodal communication for safety independence and QOL as well as reduce caregiver burden. .   OBJECTIVE IMPAIRMENTS: include aphasia and apraxia. These impairments are limiting patient from return to work, household responsibilities, and effectively communicating at home and in community. Factors affecting potential to achieve  goals and functional outcome are severity of impairments. Patient will benefit from skilled SLP services to address above impairments and improve overall function.  REHAB POTENTIAL: Good  PLAN:  SLP FREQUENCY: 2x/week  SLP DURATION: 12 weeks  PLANNED INTERVENTIONS: Language facilitation, Environmental controls, Cueing hierachy, Cognitive reorganization, Internal/external aids, Functional tasks, and Multimodal communication approach    Luisalberto Beegle, Hawa Dicecco, CCC-SLP 09/15/2023, 12:08 PM

## 2023-09-17 ENCOUNTER — Ambulatory Visit: Payer: Medicare Other | Admitting: Speech Pathology

## 2023-09-17 ENCOUNTER — Encounter: Payer: Self-pay | Admitting: Speech Pathology

## 2023-09-17 DIAGNOSIS — R482 Apraxia: Secondary | ICD-10-CM

## 2023-09-17 DIAGNOSIS — R4701 Aphasia: Secondary | ICD-10-CM

## 2023-09-17 NOTE — Therapy (Signed)
OUTPATIENT SPEECH LANGUAGE PATHOLOGY APHASIA TREATMENT   Patient Name: Kelsey Jacobs MRN: [id number] DOB:18-Apr-1946, 77 y.o., female Today's Date: 09/22/2023  PCP: Kelsey Honey MD REFERRING PROVIDER: Basilia Jumbo, FNP  END OF SESSION:     Past Medical History:  Diagnosis Date   Medical history non-contributory    Past Surgical History:  Procedure Laterality Date   BACK SURGERY     FOOT SURGERY     INCISION AND DRAINAGE ABSCESS Left 03/29/2021   Procedure: INCISION AND DRAINAGE AXILLARY ABSCESS;  Surgeon: Kelsey Miner, MD;  Location: [facility];  Service: General;  Laterality: Left;   Patient Active Problem List   Diagnosis Date Noted   Axillary abscess 03/30/2021    ONSET DATE: 07/23/2023(referral date)   REFERRING DIAG: I63.512 (ICD-10-CM) - Arterial ischemic stroke, MCA (middle cerebral artery), left, acute (HCC) Z74.09 (ICD-10-CM) - Other reduced mobility Z78.9 (ICD-10-CM) - Other specified health status I69.320 (ICD-10-CM) - Aphasia following cerebral infarction  THERAPY DIAG:  Aphasia  Verbal apraxia  Rationale for Evaluation and Treatment: Rehabilitation  SUBJECTIVE:   SUBJECTIVE "I don't know what a foil is" re: semantic feature homework Pt accompanied by: family member son Kelsey Jacobs  PERTINENT HISTORY: Pt well known to [place] from prior course of therapy s/p CVA 11/08/22. She returns due to significant frustration with aphasia affecting communication. 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 Second stroke. She received IPR at [facility] Prior working as Advertising account planner, independent.  PAIN:  Are you having pain? No  FALLS: Has patient fallen in last 6 months?  No  LIVING ENVIRONMENT: Lives with: lives with their spouse Lives in: House/apartment  PLOF:  Level of assistance: Independent with ADLs Employment: Retired  PATIENT GOALS: "I don't know"  OBJECTIVE:    STANDARDIZED ASSESSMENTS: QAB: Severe 3.44/10     TODAY'S TREATMENT:                                                                                                                                          DATE:   09/17/23: Targeted multimodal communication using writing - related word pairs for  example lasagna vs bolongese generating salient differences with frequent max written, questioning cues to generate 4-5 salient differences. She continues to benefit from foil questions to assist her auditory comprehension of "wh" questioning cues. In conversation targeting multimodal communication, Kelsey Jacobs generated 3 facts about her new puppy with frequent mod to max questioning, foil and written cues- Kelsey Jacobs used writing at word level to augment verbal communication.   09/15/23: Kelsey Jacobs's son  Kelsey Jacobs attended session today. Trained Merchant [employer] officer and Kelsey Jacobs in use of multimodal communication with compensations for aphasia - used visual cue of numbering 1-4 to assist Kelsey Jacobs in organizing written cues for aphasia. In structured task using mid to low frequency photos and SFA (semantic feature analysis) Kelsey Jacobs required frequent mod verbal, questioning, written and foil cues to generate verbally  or written, 3 related words/cues for 4 photos. Demonstrated use of foil questions to A with auditory comprehension and semantic cues. When Kelsey Jacobs attempted gestures, modeling and feedback was required to mold a more salient gesture. She used gesture for "pyramid" and "harp" Encouraged Kelsey Jacobs to accept gestures drawing verbal and written communication in conversations. In structured conversation re: where she is taking her vacation and what she will do there, Kelsey Jacobs successfully wrote town and state name, she required questioning cues to generate 3 things she will do for example, "will they cook every night?" To generate names of 2 restaurants she will eat at. Kelsey Jacobs demonstrated use of foil questions to A in eliciting salient compensations for aphasia as well as use of written cues to support Kelsey Jacobs's auditory comprehension. Encouraged him to  use written cues when Kelsey Jacobs doesn't understand his questions or topics in conversation. Educated Kelsey Jacobs that goal is for Kelsey Jacobs to use written/multimodal communication with a variety of communication partners. Kelsey Jacobs's sister plans to attend a session as well.   09/10/23: Some semantic feature analysis completed, however frustration leads to stopping the work at home. Targeted verbal/written compensations for aphasia in structured conversation. Unable to say "Kelsey Jacobs" she approximated it written - with verbal cues to write 3 words/cue about Kelsey Jacobs, she wrote "sand" and "[place]" independently. To communicate what he son brought her back from his trip, she wrote SCBAA/SCUBA - with questioning cues, including foil questions to augment auditory comprehension she wrote [place] and "Spgne" for sponge - Foil questions:"is it a fish" "is it a rock" to [employer] in writing/saying salient cues/words. Instructed Kelsey Jacobs to use foil questions at home if she is not understanding his "wh" questions he is using to facilitate her using related words as cues to get her message out. 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 She frequently verbalized "I don't know" with frustration and tearfulness through out these tasks, however with encouragement she persisted until the information was communicated. Kelsey Jacobs is not using writing to communicate with her sisters or friends. Kelsey Jacobs endorses he is carrying the conversations for her with family/friends. Kelsey Jacobs Id'd her sister Kelsey Jacobs as a person she could try writing on white board to augment conversation. I requested Kelsey Jacobs attend a session if able. We discussed goal would be to use white board to augment verbal communication outside of just spouse/son.   09/01/23: Targeted frustration management and reducing response "I don't know" during word finding  difficulty. With occasional min verbal cues and redirection, Kelsey Jacobs maintained topic/task  despite communication breakdown and frustration. With frequent mod written and verbal questioning cues, she generated (written or verbal) 3-4 key words on structured task. Kelsey Jacobs suggested topic of dinner. For words Steak, Pork, Chicken, Kelsey Jacobs, Kelsey Jacobs generated 4 written key words. Semantic Feature Analysis chart introduced, however Tura benefits more from numerical written 1., 2., 3., 4., ... Cues to list key words. Kelsey Jacobs also benefits from written cues of "wh" words to generate cues or foil words, for example "Restaurant?" To generate "Store" as a key word for [employer]. Demonstrated and modeled for Kelsey Jacobs use of these written cues to support Kelsey Jacobs's comprehension.   08/25/23 (eval day): reviewed goals and frustration tolerance affecting verbal expression   PATIENT EDUCATION: Education details: See Today's Treatment; See patient instructions Person educated: Patient and Spouse Education method: Explanation and Verbal cues Education comprehension: verbalized  understanding, verbal cues required, and needs further education   GOALS: Goals reviewed with patient? Yes  SHORT TERM GOALS: Target date: 09/22/23  Pt will name 7 items in personally relevant category with occasional min A Baseline: 2 on eval with semantic cues Goal status: ONGOING  2.  Pt will use strategies to complete structured language task despite frustration with occasional min A Baseline: aborts tasks Goal status: ONGOING  3.  Pt will write a word level with 80% accuracy and occasional min A  Baseline:  Goal status: ONGOING  4.  Pt will ID errors in written word and self correct or write an alternative key word to successfully augment verbal message with occasional min A Baseline:  Goal status:ONGING  5. Pt and caregivers will use semantic feature analysis strategies to support and repair communication breakdowns in written attempts to augment  verbal communication with usual mod A           Goal Status: ONGOING  LONG TERM GOALS: Target date: 11/17/23  Pt will use multimodal communication to make wants/needs/questions known 4/5 attempts with occasional min A Baseline:  Goal status: ONGOING  2.  Pt will use strategies to manage frustration during aphasic/apraxic errors to complete message rather than aborting and  crying 4/5 attempts with occasional min A Baseline:  Goal status: ONGOING  3.  Pt will generate simple sentences with compensatory strategies 3/4 turns in conversation with occasional min A Baseline:  Goal status: ONGOING  4.  Pt will ID error and correct or adapt written communication when breakdown occurs with occasional min A Baseline:  Goal status: ONGOING   ASSESSMENT:  CLINICAL IMPRESSION: Patient is a 76 y.o. female who was seen today for severe aphasia and verbal apraxia. She has a Publishing copy Talk which she uses occasionally. Laya uses written key word to augment verbal communication which spouse rates as 25% successful. Throughout evaluation, Enijah repeatedly would become tearful, shake her fists, repeat "IDK" or "I can't," aborting the task or her message. Poor frustration tolerance impedes word finding and speech. On confrontation writing task, Aairah would say a word, then write a different but related word. For example, when shown a cup, she stated "water" but wrote "glasses" When shown a ring, she said ring, wrote "finger" when asked to correct this she wrote "ringer" She wrote "shoemaker" for "shoe." Errors without awareness. As Kelsey Jacobs relies on written key words to augment her verbal communication, she would benefit from improving and correcting written words. She would also benefit from strategies to reduce frustration which results in her becoming tearful and/or aborting her message or therapy task. I recommend skilled ST to maximize multimodal communication for safety independence and QOL as well as reduce  caregiver burden. .   OBJECTIVE IMPAIRMENTS: include aphasia and apraxia. These impairments are limiting patient from return to work, household responsibilities, and effectively communicating at home and in community. Factors affecting potential to achieve goals and functional outcome are severity of impairments. Patient will benefit from skilled SLP services to address above impairments and improve overall function.  REHAB POTENTIAL: Good  PLAN:  SLP FREQUENCY: 2x/week  SLP DURATION: 12 weeks  PLANNED INTERVENTIONS: Language facilitation, Environmental controls, Cueing hierachy, Cognitive reorganization, Internal/external aids, Functional tasks, and Multimodal communication approach    Charlize Hathaway, Keyondra Welch, CCC-SLP 09/22/2023, 8:43 AM

## 2023-09-22 ENCOUNTER — Ambulatory Visit: Payer: Medicare Other | Admitting: Speech Pathology

## 2023-09-22 ENCOUNTER — Encounter: Payer: Self-pay | Admitting: Speech Pathology

## 2023-09-22 DIAGNOSIS — R4701 Aphasia: Secondary | ICD-10-CM

## 2023-09-22 DIAGNOSIS — R482 Apraxia: Secondary | ICD-10-CM

## 2023-09-22 NOTE — Therapy (Signed)
OUTPATIENT SPEECH LANGUAGE PATHOLOGY APHASIA TREATMENT   Patient Name: Kelsey Jacobs MRN: [id number] DOB:10-10-1946, 77 y.o., female Today's Date: 09/22/2023  PCP: Alain Honey MD REFERRING PROVIDER: Basilia Jumbo, FNP  END OF SESSION:  End of Session - 09/22/23 1320     Visit Number 6    Number of Visits 25    Date for SLP Re-Evaluation 11/17/23    Authorization Type medicare    SLP Start Time 1315    SLP Stop Time  1400    SLP Time Calculation (min) 45 min    Activity Tolerance Patient tolerated treatment well               Past Medical History:  Diagnosis Date   Medical history non-contributory    Past Surgical History:  Procedure Laterality Date   BACK SURGERY     FOOT SURGERY     INCISION AND DRAINAGE ABSCESS Left 03/29/2021   Procedure: INCISION AND DRAINAGE AXILLARY ABSCESS;  Surgeon: Griselda Miner, MD;  Location: [facility];  Service: General;  Laterality: Left;   Patient Active Problem List   Diagnosis Date Noted   Axillary abscess 03/30/2021    ONSET DATE: 07/23/2023(referral date)   REFERRING DIAG: W09.811 (ICD-10-CM) - Arterial ischemic stroke, MCA (middle cerebral artery), left, acute (HCC) Z74.09 (ICD-10-CM) - Other reduced mobility Z78.9 (ICD-10-CM) - Other specified health status I69.320 (ICD-10-CM) - Aphasia following cerebral infarction  THERAPY DIAG:  Aphasia  Verbal apraxia  Rationale for Evaluation and Treatment: Rehabilitation  SUBJECTIVE:   SUBJECTIVE  Pt expresses excitement for upcoming trip to see friends.  Pt accompanied by: significant other husband Kelsey Jacobs  PERTINENT HISTORY: Pt well known to [place] from prior course of therapy s/p CVA 11/08/22. She returns due to significant frustration with aphasia affecting communication. 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 Second stroke. She received IPR at [facility]. Prior working as Advertising account planner, independent.  PAIN:  Are you having pain? No  FALLS: Has patient  fallen in last 6 months?  No  LIVING ENVIRONMENT: Lives with: lives with their spouse Lives in: House/apartment  PLOF:  Level of assistance: Independent with ADLs Employment: Retired  PATIENT GOALS: "I don't know"  OBJECTIVE:    STANDARDIZED ASSESSMENTS: QAB: Severe 3.44/10   TODAY'S TREATMENT:                                                                                                                                         DATE:   09/22/23: Multimodal communication generating 3-4 written or verbal cues for mid frequency word with frequent to usual verbal, written cues for 5 words. Kelsey Jacobs and [name] report improved success using written communication and self correcting with related words if Kelsey Jacobs's attempt is not successful. Trained in caregiver use of written key words to A [name] in comprehending and communicating in conversations, particularly group conversations. In conversation, [name] used written  words to augment conversation telling grad student about her upcoming trip - she used gestures 3x, written cues to relay 4 pieces of information re: upcoming trip.   09/17/23: Targeted multimodal communication using writing - related word pairs for  example lasagna vs bolongese generating salient differences with frequent max written, questioning cues to generate 4-5 salient differences. She continues to benefit from foil questions to assist her auditory comprehension of "wh" questioning cues. In conversation targeting multimodal communication, Kelsey Jacobs generated 3 facts about her new puppy with frequent mod to max questioning, foil and written cues- Kelsey Jacobs used writing at word level to augment verbal communication.   09/15/23: Kelsey Jacobs's son  Kelsey Jacobs attended session today. Trained Merchant navy officer and Tiana in use of multimodal communication with compensations for aphasia - used visual cue of numbering 1-4 to assist [name] in organizing written cues for aphasia. In structured task using mid to low frequency photos and SFA (semantic  feature analysis) Kelsey Jacobs required frequent mod verbal, questioning, written and foil cues to generate verbally or written, 3 related words/cues for 4 photos. Demonstrated use of foil questions to A with auditory comprehension and semantic cues. When [name] attempted gestures, modeling and feedback was required to mold a more salient gesture. She used gesture for "pyramid" and "harp" Encouraged Kelsey Jacobs to accept gestures drawing verbal and written communication in conversations. In structured conversation re: where she is taking her vacation and what she will do there, Kelsey Jacobs successfully wrote town and state name, she required questioning cues to generate 3 things she will do for example, "will they cook every night?" To generate names of 2 restaurants she will eat at. Kelsey Jacobs demonstrated use of foil questions to A in eliciting salient compensations for aphasia as well as use of written cues to support [name]'s auditory comprehension. Encouraged him to use written cues when Kinzy doesn't understand his questions or topics in conversation. Educated Kelsey Jacobs that goal is for [name] to use written/multimodal communication with a variety of communication partners. [name]'s sister plans to attend a session as well.   09/10/23: Some semantic feature analysis completed, however frustration leads to stopping the work at home. Targeted verbal/written compensations for aphasia in structured conversation. Unable to say "[place]" she approximated it written - with verbal cues to write 3 words/cue about [place], she wrote "sand" and "Nile" independently. To communicate what he son brought her back from his trip, she wrote SCBAA/SCUBA - with questioning cues, including foil questions to augment auditory comprehension she wrote Nile and "Spgne" for sponge - Foil questions:"is it a fish" "is it a rock" to [employer] in writing/saying salient cues/words. Instructed Kelsey Jacobs to use foil questions at home if she is not understanding his "wh" questions he is using to  facilitate her using related words as cues to get her message out. 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 She frequently verbalized "I don't know" with frustration and tearfulness through out these tasks, however with encouragement she persisted until the information was communicated. Eimy is not using writing to communicate with her sisters or friends. Kelsey Jacobs endorses he is carrying the conversations for her with family/friends. Kelsey Jacobs Id'd her sister Kelsey Jacobs as a person she could try writing on white board to augment conversation. I requested Kelsey Jacobs attend a session if able. We discussed goal would be to use white board to augment verbal communication outside  of just spouse/son.   09/01/23: Targeted frustration management and reducing response "I don't know" during word finding difficulty. With occasional min verbal cues and redirection, Kelsey Jacobs maintained topic/task  despite communication breakdown and frustration. With frequent mod written and verbal questioning cues, she generated (written or verbal) 3-4 key words on structured task. Kelsey Jacobs suggested topic of dinner. For words Steak, Pork, Chicken, Salvino[employer], Effie Shy generated 4 written key words. Semantic Feature Analysis chart introduced, however Marshia benefits more from numerical written 1., 2., 3., 4., ... Cues to list key words. Kelsey Jacobs also benefits from written cues of "wh" words to generate cues or foil words, for example "Restaurant?" To generate "Store" as a key word for Belk. Demonstrated and modeled for Kelsey Jacobs use of these written cues to support Ilynn's comprehension.   08/25/23 (eval day): reviewed goals and frustration tolerance affecting verbal expression   PATIENT EDUCATION: Education details: See Today's Treatment; See patient instructions Person educated: Patient and Spouse Education method:  Explanation and Verbal cues Education comprehension: verbalized understanding, verbal cues required, and needs further education   GOALS: Goals reviewed with patient? Yes  SHORT TERM GOALS: Target date: 09/22/23  Pt will name 7 items in personally relevant category with occasional min A Baseline: 2 on eval with semantic cues Goal status: ONGOING  2.  Pt will use strategies to complete structured language task despite frustration with occasional min A Baseline: aborts tasks Goal status: ONGOING  3.  Pt will write a word level with 80% accuracy and occasional min A  Baseline:  Goal status: ONGOING  4.  Pt will ID errors in written word and self correct or write an alternative key word to successfully augment verbal message with occasional min A Baseline:  Goal status:ONGING  5. Pt and caregivers will use semantic feature analysis strategies to support and repair communication breakdowns in written attempts to augment verbal communication with usual mod A           Goal Status: ONGOING  LONG TERM GOALS: Target date: 11/17/23  Pt will use multimodal communication to make wants/needs/questions known 4/5 attempts with occasional min A Baseline:  Goal status: ONGOING  2.  Pt will use strategies to manage frustration during aphasic/apraxic errors to complete message rather than aborting and  crying 4/5 attempts with occasional min A Baseline:  Goal status: ONGOING  3.  Pt will generate simple sentences with compensatory strategies 3/4 turns in conversation with occasional min A Baseline:  Goal status: ONGOING  4.  Pt will ID error and correct or adapt written communication when breakdown occurs with occasional min A Baseline:  Goal status: ONGOING   ASSESSMENT:  CLINICAL IMPRESSION: Patient is a 77 y.o. female who was seen today for severe aphasia and verbal apraxia. She has a Publishing copy Talk which she uses occasionally. Jaydee uses written key word to augment verbal  communication which spouse rates as 25% successful. Throughout evaluation, Emmie repeatedly would become tearful, shake her fists, repeat "IDK" or "I can't," aborting the task or her message. Poor frustration tolerance impedes word finding and speech. On confrontation writing task, Rosselin would say a word, then write a different but related word. For example, when shown a cup, she stated "water" but wrote "glasses" When shown a ring, she said ring, wrote "finger" when asked to correct this she wrote "ringer" She wrote "shoemaker" for "shoe." Errors without awareness. As Tamir relies on written key words to augment her verbal communication, she would benefit from improving and correcting  written words. She would also benefit from strategies to reduce frustration which results in her becoming tearful and/or aborting her message or therapy task. I recommend skilled ST to maximize multimodal communication for safety independence and QOL as well as reduce caregiver burden. .   OBJECTIVE IMPAIRMENTS: include aphasia and apraxia. These impairments are limiting patient from return to work, household responsibilities, and effectively communicating at home and in community. Factors affecting potential to achieve goals and functional outcome are severity of impairments. Patient will benefit from skilled SLP services to address above impairments and improve overall function.  REHAB POTENTIAL: Good  PLAN:  SLP FREQUENCY: 2x/week  SLP DURATION: 12 weeks  PLANNED INTERVENTIONS: Language facilitation, Environmental controls, Cueing hierachy, Cognitive reorganization, Internal/external aids, Functional tasks, and Multimodal communication approach    Tramel Westbrook, Lillyen Vanluven, CCC-SLP 09/22/2023, 3:32 PM

## 2023-09-24 ENCOUNTER — Encounter: Payer: Medicare Other | Admitting: Speech Pathology

## 2023-09-29 ENCOUNTER — Ambulatory Visit: Payer: Medicare Other | Admitting: Speech Pathology

## 2023-10-01 ENCOUNTER — Encounter: Payer: Self-pay | Admitting: Speech Pathology

## 2023-10-01 ENCOUNTER — Ambulatory Visit: Payer: Medicare Other | Admitting: Speech Pathology

## 2023-10-01 DIAGNOSIS — R4701 Aphasia: Secondary | ICD-10-CM

## 2023-10-01 DIAGNOSIS — R482 Apraxia: Secondary | ICD-10-CM

## 2023-10-01 NOTE — Therapy (Signed)
OUTPATIENT SPEECH LANGUAGE PATHOLOGY APHASIA TREATMENT   Patient Name: Kelsey Jacobs MRN: [id number] DOB:Jan 23, 1946, 77 y.o., female Today's Date: 10/01/2023  PCP: Kelsey Honey MD REFERRING PROVIDER: Basilia Jumbo, FNP  END OF SESSION:  End of Session - 10/01/23 1106     Visit Number 7    Number of Visits 25    Date for SLP Re-Evaluation 11/17/23    SLP Start Time 1100    SLP Stop Time  1145    SLP Time Calculation (min) 45 min    Activity Tolerance Patient tolerated treatment well               Past Medical History:  Diagnosis Date   Medical history non-contributory    Past Surgical History:  Procedure Laterality Date   BACK SURGERY     FOOT SURGERY     INCISION AND DRAINAGE ABSCESS Left 03/29/2021   Procedure: INCISION AND DRAINAGE AXILLARY ABSCESS;  Surgeon: Kelsey Miner, MD;  Location: [facility];  Service: General;  Laterality: Left;   Patient Active Problem List   Diagnosis Date Noted   Axillary abscess 03/30/2021    ONSET DATE: 07/23/2023(referral date)   REFERRING DIAG: C16.606 (ICD-10-CM) - Arterial ischemic stroke, MCA (middle cerebral artery), left, acute (HCC) Z74.09 (ICD-10-CM) - Other reduced mobility Z78.9 (ICD-10-CM) - Other specified health status I69.320 (ICD-10-CM) - Aphasia following cerebral infarction  THERAPY DIAG:  Aphasia  Verbal apraxia  Rationale for Evaluation and Treatment: Rehabilitation  SUBJECTIVE:   SUBJECTIVE  "The trip I had was good"  Pt accompanied by: significant other husband Kelsey Jacobs  PERTINENT HISTORY: Pt well known to [place] from prior course of therapy s/p CVA 11/08/22. She returns due to significant frustration with aphasia affecting communication. 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 Second stroke. She received IPR at [facility]. Prior working as Advertising account planner, independent.  PAIN:  Are you having pain? No  FALLS: Has patient fallen in last 6 months?  No  LIVING ENVIRONMENT: Lives with:  lives with their spouse Lives in: House/apartment  PLOF:  Level of assistance: Independent with ADLs Employment: Retired  PATIENT GOALS: "I don't know"  OBJECTIVE:    STANDARDIZED ASSESSMENTS: QAB: Severe 3.44/10   TODAY'S TREATMENT:                                                                                                                                         DATE:   10/01/23: Kelsey Jacobs used [employer] Kelsey few times on vacation if needed with success to augment verbal communication. She required frequent questioning cues to write 3 related words/cues re: where her dog went swimming  and what she ate in conversation re: her trip. Structured tasks targeting generating (verbal or written) descriptions/cues at word level re: presidential candidates, voting and famous politicians. With frequent questioning cues, Kelsey Jacobs generated 3-4 cues for 4 presidents/candidates at word level. She required  visual cues to self correct written errors that did not approximate the word she meant. She and Kelsey Jacobs continue to endorse use of white board/written cues to augment verbal communication at home.   09/22/23: Multimodal communication generating 3-4 written or verbal cues for mid frequency word with frequent to usual verbal, written cues for 5 words. Kelsey Jacobs and Kelsey Jacobs report improved success using written communication and self correcting with related words if Kelsey Jacobs's attempt is not successful. Trained in caregiver use of written key words to Kelsey Quamesha in comprehending and communicating in conversations, particularly group conversations. In conversation, Kelsey Jacobs used written words to augment conversation telling grad student about her upcoming trip - she used gestures 3x, written cues to relay 4 pieces of information re: upcoming trip.   09/17/23: Targeted multimodal communication using writing - related word pairs for  example lasagna vs bolongese generating salient differences with frequent max written, questioning cues to generate  4-5 salient differences. She continues to benefit from foil questions to assist her auditory comprehension of "wh" questioning cues. In conversation targeting multimodal communication, Kelsey Jacobs generated 3 facts about her new puppy with frequent mod to max questioning, foil and written cues- Kelsey Jacobs used writing at word level to augment verbal communication.   09/15/23: Kelsey Jacobs's son  Kelsey Jacobs attended session today. Trained Kelsey Jacobs in use of multimodal communication with compensations for aphasia - used visual cue of numbering 1-4 to assist Kelsey Jacobs in organizing written cues for aphasia. In structured task using mid to low frequency photos and SFA (semantic feature analysis) Kelsey Jacobs required frequent mod verbal, questioning, written and foil cues to generate verbally or written, 3 related words/cues for 4 photos. Demonstrated use of foil questions to Kelsey with auditory comprehension and semantic cues. When Kelsey Jacobs attempted gestures, modeling and feedback was required to mold Kelsey more salient gesture. She used gesture for "pyramid" and "harp" Encouraged Kelsey Jacobs to accept gestures drawing verbal and written communication in conversations. In structured conversation re: where she is taking her vacation and what she will do there, [name] successfully wrote town and state name, she required questioning cues to generate 3 things she will do for example, "will they cook every night?" To generate names of 2 restaurants she will eat at. Kelsey Jacobs demonstrated use of foil questions to Kelsey in eliciting salient compensations for aphasia as well as use of written cues to support [name]'s auditory comprehension. Encouraged him to use written cues when [name] doesn't understand his questions or topics in conversation. Educated Kelsey Jacobs that goal is for [name] to use written/multimodal communication with Kelsey variety of communication partners. [name] plans to attend Kelsey session as well.   09/10/23: Some semantic feature analysis completed, however frustration leads to stopping the  work at home. Targeted verbal/written compensations for aphasia in structured conversation. Unable to say "[place]" she approximated it written - with verbal cues to write 3 words/cue about [place], she wrote "sand" and "Nile" independently. To communicate what he son brought her back from his trip, she wrote SCBAA/SCUBA - with questioning cues, including foil questions to augment auditory comprehension she wrote [place] and "Spgne" for sponge - Foil questions:"is it Kelsey fish" "is it Kelsey rock" to [employer] in writing/saying salient cues/words. Instructed Kelsey Jacobs to use foil questions at home if she is not understanding his "wh" questions he is using to facilitate her using related words as cues to get her message out. 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 She frequently verbalized "I don't know" with frustration and tearfulness through out these tasks, however with encouragement she persisted until the information was communicated. Charisma is not using writing to communicate with her sisters or friends. Kelsey Jacobs endorses he is carrying the conversations for her with family/friends. Meiah Id'd her sister Thayer Ohm as Kelsey person she could try writing on white board to augment conversation. I requested Thayer Ohm attend Kelsey session if able. We discussed goal would be to use white board to augment verbal communication outside of just spouse/son.   08/25/23 (eval day): reviewed goals and frustration tolerance affecting verbal expression   PATIENT EDUCATION: Education details: See Today's Treatment; See patient instructions Person educated: Patient and Spouse Education method: Explanation and Verbal cues Education comprehension: verbalized understanding, verbal cues required, and needs further education   GOALS: Goals reviewed with patient? Yes  SHORT TERM GOALS: Target date:  09/22/23  Pt will name 7 items in personally relevant category with occasional min Kelsey Baseline: 2 on eval with semantic cues Goal status: ONGOING  2.  Pt will use strategies to complete structured language task despite frustration with occasional min Kelsey Baseline: aborts tasks Goal status: ONGOING  3.  Pt will write Kelsey word level with 80% accuracy and occasional min Kelsey  Baseline:  Goal status: ONGOING  4.  Pt will ID errors in written word and self correct or write an alternative key word to successfully augment verbal message with occasional min Kelsey Baseline:  Goal status:ONGING  5. Pt and caregivers will use semantic feature analysis strategies to support and repair communication breakdowns in written attempts to augment verbal communication with usual mod Kelsey           Goal Status: ONGOING  LONG TERM GOALS: Target date: 11/17/23  Pt will use multimodal communication to make wants/needs/questions known 4/5 attempts with occasional min Kelsey Baseline:  Goal status: ONGOING  2.  Pt will use strategies to manage frustration during aphasic/apraxic errors to complete message rather than aborting and  crying 4/5 attempts with occasional min Kelsey Baseline:  Goal status: ONGOING  3.  Pt will generate simple sentences with compensatory strategies 3/4 turns in conversation with occasional min Kelsey Baseline:  Goal status: ONGOING  4.  Pt will ID error and correct or adapt written communication when breakdown occurs with occasional min Kelsey Baseline:  Goal status: ONGOING   ASSESSMENT:  CLINICAL IMPRESSION: Patient is Kelsey 77 y.o. female who was seen today for severe aphasia and verbal apraxia. She has Kelsey Publishing copy Talk which she uses occasionally. Izzabell uses written key word to augment verbal communication which spouse rates as 25% successful. Throughout evaluation, Lavella repeatedly would become tearful, shake her fists, repeat "IDK" or "I can't," aborting the task or her message. Poor frustration  tolerance impedes word finding and speech. On confrontation writing task, Kolene would say Kelsey word, then write Kelsey different but related word. For example, when shown Kelsey cup, she stated "water" but wrote "glasses" When shown Kelsey ring, she said ring, wrote "finger" when asked to correct this she wrote "ringer" She wrote "shoemaker" for "shoe." Errors without awareness. As Kaylynne relies on written key words to augment her verbal communication, she would benefit from improving and correcting written words. She would also benefit from strategies to reduce frustration which results in her becoming tearful and/or aborting her message or therapy task. I recommend skilled ST to  maximize multimodal communication for safety independence and QOL as well as reduce caregiver burden. .   OBJECTIVE IMPAIRMENTS: include aphasia and apraxia. These impairments are limiting patient from return to work, household responsibilities, and effectively communicating at home and in community. Factors affecting potential to achieve goals and functional outcome are severity of impairments. Patient will benefit from skilled SLP services to address above impairments and improve overall function.  REHAB POTENTIAL: Good  PLAN:  SLP FREQUENCY: 2x/week  SLP DURATION: 12 weeks  PLANNED INTERVENTIONS: Language facilitation, Environmental controls, Cueing hierachy, Cognitive reorganization, Internal/external aids, Functional tasks, and Multimodal communication approach    Donevin Sainsbury, Evalette Coulton, CCC-SLP 10/01/2023, 11:55 AM

## 2023-10-06 ENCOUNTER — Ambulatory Visit: Payer: Medicare Other | Admitting: Speech Pathology

## 2023-10-06 DIAGNOSIS — R4701 Aphasia: Secondary | ICD-10-CM | POA: Diagnosis not present

## 2023-10-06 DIAGNOSIS — R482 Apraxia: Secondary | ICD-10-CM

## 2023-10-06 NOTE — Therapy (Signed)
OUTPATIENT SPEECH LANGUAGE PATHOLOGY APHASIA TREATMENT   Patient Name: Kelsey Jacobs MRN: [id number] DOB:02-May-1946, 77 y.o., female Today's Date: 10/06/2023  PCP: Alain Honey MD REFERRING PROVIDER: Basilia Jumbo, FNP  END OF SESSION:  End of Session - 10/06/23 1107     Visit Number 8    Number of Visits 25    Date for SLP Re-Evaluation 11/17/23    Authorization Type medicare    SLP Start Time 1100    SLP Stop Time  1145    SLP Time Calculation (min) 45 min    Activity Tolerance Patient tolerated treatment well               Past Medical History:  Diagnosis Date   Medical history non-contributory    Past Surgical History:  Procedure Laterality Date   BACK SURGERY     FOOT SURGERY     INCISION AND DRAINAGE ABSCESS Left 03/29/2021   Procedure: INCISION AND DRAINAGE AXILLARY ABSCESS;  Surgeon: Griselda Miner, MD;  Location: [facility];  Service: General;  Laterality: Left;   Patient Active Problem List   Diagnosis Date Noted   Axillary abscess 03/30/2021    ONSET DATE: 07/23/2023(referral date)   REFERRING DIAG: W09.811 (ICD-10-CM) - Arterial ischemic stroke, MCA (middle cerebral artery), left, acute (HCC) Z74.09 (ICD-10-CM) - Other reduced mobility Z78.9 (ICD-10-CM) - Other specified health status I69.320 (ICD-10-CM) - Aphasia following cerebral infarction  THERAPY DIAG:  Aphasia  Verbal apraxia  Rationale for Evaluation and Treatment: Rehabilitation  SUBJECTIVE:   SUBJECTIVE  "The trip I had was good"  Pt accompanied by: significant other husband Al  PERTINENT HISTORY: Pt well known to [place] from prior course of therapy s/p CVA 11/08/22. She returns due to significant frustration with aphasia affecting communication. 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 Second stroke. She received IPR at [facility]. Prior working as Advertising account planner, independent.  PAIN:  Are you having pain? No  FALLS: Has patient fallen in last 6 months?   No  LIVING ENVIRONMENT: Lives with: lives with their spouse Lives in: House/apartment  PLOF:  Level of assistance: Independent with ADLs Employment: Retired  PATIENT GOALS: "I don't know"  OBJECTIVE:    STANDARDIZED ASSESSMENTS: QAB: Severe 3.44/10   TODAY'S TREATMENT:                                                                                                                                         DATE:   10/06/23: Jhordyn and Al continue to endorse that Hema is using written and verbal cues to help repair communication breakdown. Today, she had trouble naming [name] and stuffing in conversation. With usual min to mod verbal cues, Joleigh generated 4-5 written or verbal cues to assist in communicating her thought. In structured task, Albertina generated 6 items she served at her Christmas open house and Thanksgiving dinner. She required verbal and visual  cues to ID and correct written errors that were not approximations of the word she meant to write. Trey continues multiple attempts to complete her message with trials of written words with rare min encouragement. The last 3 sessions she has not refused to continue when frustrated. Family reports reduced episodes where she quits communicating due to frustration at home as well. Son present last 5 minutes of session. Ongoing encouragement to use white board with other family members and encouraging Rickeya's sister to attend a session for training.    10/01/23: Geniece used [employer] a few times on vacation if needed with success to augment verbal communication. She required frequent questioning cues to write 3 related words/cues re: where her dog went swimming  and what she ate in conversation re: her trip. Structured tasks targeting generating (verbal or written) descriptions/cues at word level re: presidential candidates, voting and famous politicians. With frequent questioning cues, Basma generated 3-4 cues for 4 presidents/candidates at word level. She  required visual cues to self correct written errors that did not approximate the word she meant. She and Al continue to endorse use of white board/written cues to augment verbal communication at home.   09/22/23: Multimodal communication generating 3-4 written or verbal cues for mid frequency word with frequent to usual verbal, written cues for 5 words. Al and Shondrika report improved success using written communication and self correcting with related words if Collette's attempt is not successful. Trained in caregiver use of written key words to A Ari[place]na in comprehending and communicating in conversations, particularly group conversations. In conversation, Lizbeth used written words to augment conversation telling grad student about her upcoming trip - she used gestures 3x, written cues to relay 4 pieces of information re: upcoming trip.   09/17/23: Targeted multimodal communication using writing - related word pairs for  example lasagna vs bolongese generating salient differences with frequent max written, questioning cues to generate 4-5 salient differences. She continues to benefit from foil questions to assist her auditory comprehension of "wh" questioning cues. In conversation targeting multimodal communication, Primrose generated 3 facts about her new puppy with frequent mod to max questioning, foil and written cues- Akirah used writing at word level to augment verbal communication.   09/15/23: Tierrah's son  [name] attended session today. Trained Merchant navy officer and Anzley in use of multimodal communication with compensations for aphasia - used visual cue of numbering 1-4 to assist Hajra in organizing written cues for aphasia. In structured task using mid to low frequency photos and SFA (semantic feature analysis) Aspasia required frequent mod verbal, questioning, written and foil cues to generate verbally or written, 3 related words/cues for 4 photos. Demonstrated use of foil questions to A with auditory comprehension and semantic cues. When Nyelle  attempted gestures, modeling and feedback was required to mold a more salient gesture. She used gesture for "pyramid" and "harp" Encouraged [name] to accept gestures drawing verbal and written communication in conversations. In structured conversation re: where she is taking her vacation and what she will do there, Itzel successfully wrote town and state name, she required questioning cues to generate 3 things she will do for example, "will they cook every night?" To generate names of 2 restaurants she will eat at. [name] demonstrated use of foil questions to A in eliciting salient compensations for aphasia as well as use of written cues to support Yolander's auditory comprehension. Encouraged him to use written cues when Leidy doesn't understand his questions or topics in conversation. Educated Merchant [employer] officer that  goal is for Bethany to use written/multimodal communication with a variety of communication partners. Jimi's sister plans to attend a session as well.   09/10/23: Some semantic feature analysis completed, however frustration leads to stopping the work at home. Targeted verbal/written compensations for aphasia in structured conversation. Unable to say "[place]" she approximated it written - with verbal cues to write 3 words/cue about [place], she wrote "sand" and "[place]" independently. To communicate what he son brought her back from his trip, she wrote [employer] - with questioning cues, including foil questions to augment auditory comprehension she wrote [place] and "Sp[place]" for sponge - Foil questions:"is it a fish" "is it a rock" to [employer] in writing/saying salient cues/words. Instructed Al to use foil questions at home if she is not understanding his "wh" questions he is using to facilitate her using related words as cues to get her message out. 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 She frequently verbalized "I don't know" with frustration and tearfulness through out these tasks, however with encouragement she persisted until the information was communicated. Kiari is not using writing to communicate with her sisters or friends. Al endorses he is carrying the conversations for her with family/friends. Zalma Id'd her sister Thayer Ohm as a person she could try writing on white board to augment conversation. I requested Thayer Ohm attend a session if able. We discussed goal would be to use white board to augment verbal communication outside of just spouse/son.   08/25/23 (eval day): reviewed goals and frustration tolerance affecting verbal expression   PATIENT EDUCATION: Education details: See Today's Treatment; See patient instructions Person educated: Patient and Spouse Education method: Explanation and Verbal cues Education comprehension: verbalized understanding, verbal cues required, and needs further education   GOALS: Goals reviewed with patient? Yes  SHORT TERM GOALS: Target date: 09/22/23  Pt will name 7 items in personally relevant category with occasional min A Baseline: 2 on eval with semantic cues Goal status: MET  2.  Pt will use strategies to complete structured language task despite frustration with occasional min A Baseline: aborts tasks Goal status: MET  3.  Pt will write a word level with 80% accuracy and occasional min A  Baseline:  Goal status: MET  4.  Pt will ID errors in written word and self correct or write an alternative key word to successfully augment verbal message with occasional min A Baseline:  Goal status:PARTIALLY MET  5. Pt and caregivers will use semantic feature analysis strategies to support and repair communication breakdowns in written attempts to augment verbal communication with usual mod A           Goal Status: MET  LONG TERM GOALS: Target date: 11/17/23  Pt will use multimodal communication to make  wants/needs/questions known 4/5 attempts with occasional min A Baseline:  Goal status: ONGOING  2.  Pt will use strategies to manage frustration during aphasic/apraxic errors to complete message rather than aborting and  crying 4/5 attempts with occasional min A Baseline:  Goal status: ONGOING  3.  Pt will generate simple sentences with compensatory strategies 3/4 turns in conversation with occasional min A Baseline:  Goal status: ONGOING  4.  Pt will ID error and correct or adapt written communication when breakdown occurs with occasional min A Baseline:  Goal status: ONGOING   ASSESSMENT:  CLINICAL IMPRESSION:  Patient is a 77 y.o. female who was seen today for severe aphasia and verbal apraxia. With semantic feature questioning cues from family, Akeba is using writing at word level to augment verbaI expression. When she can't find a word, with questions, Kathleene can generate 3-4 related words to give family cues to help complete her message. Frustration resulting in aborting her message has reduced to none in ST with minimal encouragement. Family also reports she is managing frustration and not quitting her message at home as well. I continue to encourage Aaleigha and her family to use her white board with more people, including her sisters and some friends. Irecommend skilled ST to maximize multimodal communication for safety independence and QOL as well as reduce caregiver burden. .   OBJECTIVE IMPAIRMENTS: include aphasia and apraxia. These impairments are limiting patient from return to work, household responsibilities, and effectively communicating at home and in community. Factors affecting potential to achieve goals and functional outcome are severity of impairments. Patient will benefit from skilled SLP services to address above impairments and improve overall function.  REHAB POTENTIAL: Good  PLAN:  SLP FREQUENCY: 2x/week  SLP DURATION: 12 weeks  PLANNED INTERVENTIONS: Language  facilitation, Environmental controls, Cueing hierachy, Cognitive reorganization, Internal/external aids, Functional tasks, and Multimodal communication approach    Bonner Larue, Linzee Olesh, CCC-SLP 10/06/2023, 12:08 PM

## 2023-10-08 ENCOUNTER — Encounter: Payer: Self-pay | Admitting: Speech Pathology

## 2023-10-08 ENCOUNTER — Ambulatory Visit: Payer: Medicare Other | Admitting: Speech Pathology

## 2023-10-08 DIAGNOSIS — R4701 Aphasia: Secondary | ICD-10-CM | POA: Diagnosis not present

## 2023-10-08 DIAGNOSIS — R482 Apraxia: Secondary | ICD-10-CM

## 2023-10-08 NOTE — Therapy (Signed)
OUTPATIENT SPEECH LANGUAGE PATHOLOGY APHASIA TREATMENT   Patient Name: Kelsey Jacobs Jacobs MRN: [id number] DOB:12-Oct-1946, 77 y.o., female Today's Date: 10/08/2023  PCP: Kelsey Honey MD REFERRING PROVIDER: Basilia Jumbo, FNP  END OF SESSION:  End of Session - 10/08/23 1107     Visit Number 9    Number of Visits 25    Date for SLP Re-Evaluation 11/17/23    Authorization Type medicare    SLP Start Time 1100    SLP Stop Time  1145    SLP Time Calculation (min) 45 min    Activity Tolerance Patient tolerated treatment well               Past Medical History:  Diagnosis Date   Medical history non-contributory    Past Surgical History:  Procedure Laterality Date   BACK SURGERY     FOOT SURGERY     INCISION AND DRAINAGE ABSCESS Left 03/29/2021   Procedure: INCISION AND DRAINAGE AXILLARY ABSCESS;  Surgeon: Griselda Miner, MD;  Location: [facility];  Service: General;  Laterality: Left;   Patient Active Problem List   Diagnosis Date Noted   Axillary abscess 03/30/2021    ONSET DATE: 07/23/2023(referral date)   REFERRING DIAG: Y86.578 (ICD-10-CM) - Arterial ischemic stroke, MCA (middle cerebral artery), left, acute (HCC) Z74.09 (ICD-10-CM) - Other reduced mobility Z78.9 (ICD-10-CM) - Other specified health status I69.320 (ICD-10-CM) - Aphasia following cerebral infarction  THERAPY DIAG:  Aphasia  Verbal apraxia  Rationale for Evaluation and Treatment: Rehabilitation  SUBJECTIVE:   SUBJECTIVE  "The trip I had was good"  Pt accompanied by: significant other husband Kelsey Jacobs  PERTINENT HISTORY: Pt well known to [place] from prior course of therapy s/p CVA 11/08/22. She returns due to significant frustration with aphasia affecting communication. 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 Second stroke. She [facility]. Prior working as Advertising account planner, independent.  PAIN:  Are you having pain? No  FALLS: Has patient fallen in last 6 months?   No  LIVING ENVIRONMENT: Lives with: lives with their spouse Lives in: House/apartment  PLOF:  Level of assistance: Independent with ADLs Employment: Retired  PATIENT GOALS: "I don't know"  OBJECTIVE:    STANDARDIZED ASSESSMENTS: QAB: Severe 3.44/10   TODAY'S TREATMENT:                                                                                                                                         DATE:    10/22/23 will be 10th visits - progress note due  10/08/23: Targeted multimodal communication in conversation re: her hobbies of wine, planning a trip to [place]. To communicate where she met friends she is seeing this weekend, [name] required min verbal cues to reduce frustration and written foil questions (church or work) to generate written "wine bar" She used written word to help her convey she wants to do a river  cruise and used gestures to indicate her preference for smaller crowds with mod I. With frequent semantic cues and occasional written cues (fill in blank) [name] generated 6 cities she would visit. Her sister is to attend next session.    10/06/23: [name] and Kelsey Jacobs continue to endorse that [name] is using written and verbal cues to help repair communication breakdown. Today, she had trouble naming [name] and stuffing in conversation. With usual min to mod verbal cues, [name] generated 4-5 written or verbal cues to assist in communicating her thought. In structured task, [name] generated 6 items she served at her Christmas open house and Thanksgiving dinner. She required verbal and visual cues to ID and correct written errors that were not approximations of the word she meant to write. [name] continues multiple attempts to complete her message with trials of written words with rare min encouragement. The last 3 sessions she has not refused to continue when frustrated. Family reports reduced episodes where she quits communicating due to frustration at home as well. Son present last 5 minutes of  session. Ongoing encouragement to use white board with other family members and encouraging [name]'s sister to attend a session for training.    10/01/23: [name] used [employer] a few times on vacation if needed with success to augment verbal communication. She required frequent questioning cues to write 3 related words/cues re: where her dog went swimming  and what she ate in conversation re: her trip. Structured tasks targeting generating (verbal or written) descriptions/cues at word level re: presidential candidates, voting and famous politicians. With frequent questioning cues, [name] generated 3-4 cues for 4 presidents/candidates at word level. She required visual cues to self correct written errors that did not approximate the word she meant. She and Kelsey Jacobs continue to endorse use of white board/written cues to augment verbal communication at home.   09/22/23: Multimodal communication generating 3-4 written or verbal cues for mid frequency word with frequent to usual verbal, written cues for 5 words. Kelsey Jacobs and [name] report improved success using written communication and self correcting with related words if [name]'s attempt is not successful. Trained in caregiver use of written key words to A Shatiqua in comprehending and communicating in conversations, particularly group conversations. In conversation, Reha used written words to augment conversation telling grad student about her upcoming trip - she used gestures 3x, written cues to relay 4 pieces of information re: upcoming trip.   09/17/23: Targeted multimodal communication using writing - related word pairs for  example lasagna vs bolongese generating salient differences with frequent max written, questioning cues to generate 4-5 salient differences. She continues to benefit from foil questions to assist her auditory comprehension of "wh" questioning cues. In conversation targeting multimodal communication, [name] generated 3 facts about her new puppy with frequent mod to max  questioning, foil and written cues- [name] used writing at word level to augment verbal communication.   09/15/23: [name]'s son  [name] attended session today. Trained Merchant navy officer and [place] in use of multimodal communication with compensations for aphasia - used visual cue of numbering 1-4 to assist Mylin in organizing written cues for aphasia. In structured task using mid to low frequency photos and SFA (semantic feature analysis) Garlene required frequent mod verbal, questioning, written and foil cues to generate verbally or written, 3 related words/cues for 4 photos. Demonstrated use of foil questions to A with auditory comprehension and semantic cues. When Trinita attempted gestures, modeling and feedback was required to mold a more salient gesture. She  used gesture for "pyramid" and "harp" Encouraged [name] to accept gestures drawing verbal and written communication in conversations. In structured conversation re: where she is taking her vacation and what she will do there, Pankie successfully wrote town and state name, she required questioning cues to generate 3 things she will do for example, "will they cook every night?" To generate names of 2 restaurants she will eat at. [name] demonstrated use of foil questions to A in eliciting salient compensations for aphasia as well as use of written cues to support Rhealyn's auditory comprehension. Encouraged him to use written cues when Elizabet doesn't understand his questions or topics in conversation. Educated [name] that goal is for Aubriauna to use written/multimodal communication with a variety of communication partners. Tonda's sister plans to attend a session as well.   09/10/23: Some semantic feature analysis completed, however frustration leads to stopping the work at home. Targeted verbal/written compensations for aphasia in structured conversation. Unable to say "[place]" she approximated it written - with verbal cues to write 3 words/cue about [place], she wrote "sand" and "[place]" independently. To  communicate what he son brought her back from his trip, she wrote [employer] - with questioning cues, including foil questions to augment auditory comprehension she wrote [place] and "Spgne" for sponge - Foil questions:"is it a fish" "is it a rock" to [employer] in writing/saying salient cues/words. Instructed Kelsey Jacobs to use foil questions at home if she is not understanding his "wh" questions he is using to facilitate her using related words as cues to get her message out. 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 She frequently verbalized "I don't know" with frustration and tearfulness through out these tasks, however with encouragement she persisted until the information was communicated. Keiyanna is not using writing to communicate with her sisters or friends. Kelsey Jacobs endorses he is carrying the conversations for her with family/friends. Finola Id'd her sister Thayer Ohm as a person she could try writing on white board to augment conversation. I requested Thayer Ohm attend a session if able. We discussed goal would be to use white board to augment verbal communication outside of just spouse/son.   08/25/23 (eval day): reviewed goals and frustration tolerance affecting verbal expression   PATIENT EDUCATION: Education details: See Today's Treatment; See patient instructions Person educated: Patient and Spouse Education method: Explanation and Verbal cues Education comprehension: verbalized understanding, verbal cues required, and needs further education   GOALS: Goals reviewed with patient? Yes  SHORT TERM GOALS: Target date: 09/22/23  Pt will name 7 items in personally relevant category with occasional min A Baseline: 2 on eval with semantic cues Goal status: MET  2.  Pt will use strategies to complete structured language task despite frustration with occasional  min A Baseline: aborts tasks Goal status: MET  3.  Pt will write a word level with 80% accuracy and occasional min A  Baseline:  Goal status: MET  4.  Pt will ID errors in written word and self correct or write an alternative key word to successfully augment verbal message with occasional min A Baseline:  Goal status:PARTIALLY MET  5. Pt and caregivers will use semantic feature analysis strategies to support and repair communication breakdowns in written attempts to augment verbal communication with usual mod A           Goal Status: MET  LONG  TERM GOALS: Target date: 11/17/23  Pt will use multimodal communication to make wants/needs/questions known 4/5 attempts with occasional min A Baseline:  Goal status: ONGOING  2.  Pt will use strategies to manage frustration during aphasic/apraxic errors to complete message rather than aborting and  crying 4/5 attempts with occasional min A Baseline:  Goal status: ONGOING  3.  Pt will generate simple sentences with compensatory strategies 3/4 turns in conversation with occasional min A Baseline:  Goal status: ONGOING  4.  Pt will ID error and correct or adapt written communication when breakdown occurs with occasional min A Baseline:  Goal status: ONGOING   ASSESSMENT:  CLINICAL IMPRESSION: Patient is a 77 y.o. female who was seen today for severe aphasia and verbal apraxia. With semantic feature questioning cues from family, Myong is using writing at word level to augment verbaI expression. When she can't find a word, with questions, Kelleen can generate 3-4 related words to give family cues to help complete her message. Frustration resulting in aborting her message has reduced to none in ST with minimal encouragement. Family also reports she is managing frustration and not quitting her message at home as well. I continue to encourage Lannis and her family to use her white board with more people, including her sisters and some friends. Irecommend  skilled ST to maximize multimodal communication for safety independence and QOL as well as reduce caregiver burden. .   OBJECTIVE IMPAIRMENTS: include aphasia and apraxia. These impairments are limiting patient from return to work, household responsibilities, and effectively communicating at home and in community. Factors affecting potential to achieve goals and functional outcome are severity of impairments. Patient will benefit from skilled SLP services to address above impairments and improve overall function.  REHAB POTENTIAL: Good  PLAN:  SLP FREQUENCY: 2x/week  SLP DURATION: 12 weeks  PLANNED INTERVENTIONS: Language facilitation, Environmental controls, Cueing hierachy, Cognitive reorganization, Internal/external aids, Functional tasks, and Multimodal communication approach    Zyanne Schumm, Curtina Atlas, CCC-SLP 10/08/2023, 11:16 AM

## 2023-10-17 NOTE — Progress Notes (Unsigned)
    Aleen Sells D.[facility] [address] [place] [address] Phone: [phone]   Assessment and Plan:     There are no diagnoses linked to this encounter.  ***   Pertinent previous records reviewed include ***    Follow Up: ***     Subjective:   I, Edge Mauger, am serving as a Neurosurgeon for Doctor Richardean Sale  Chief Complaint: leg pain  HPI:   10/20/2023 Patient is a 77 year old female with concerns of leg pain. Patient states  Relevant Historical Information: ***  Additional pertinent review of systems negative.  No current outpatient medications on file.   Objective:     There were no vitals filed for this visit.    There is no height or weight on file to calculate BMI.    Physical Exam:    ***   Electronically signed by:  Aleen Sells D.[facility] 7:34 AM 10/17/23

## 2023-10-20 ENCOUNTER — Ambulatory Visit (INDEPENDENT_AMBULATORY_CARE_PROVIDER_SITE_OTHER): Payer: Self-pay | Admitting: Sports Medicine

## 2023-10-20 VITALS — BP 126/82 | HR 53 | Ht 62.0 in | Wt 183.0 lb

## 2023-10-20 DIAGNOSIS — Z96642 Presence of left artificial hip joint: Secondary | ICD-10-CM | POA: Diagnosis not present

## 2023-10-20 DIAGNOSIS — M5442 Lumbago with sciatica, left side: Secondary | ICD-10-CM

## 2023-10-20 DIAGNOSIS — G8929 Other chronic pain: Secondary | ICD-10-CM

## 2023-10-20 DIAGNOSIS — M25552 Pain in left hip: Secondary | ICD-10-CM | POA: Diagnosis not present

## 2023-10-20 NOTE — Patient Instructions (Signed)
Epidural referral left L5-S1 Tylenol [phone] mg 2-3 times a day for pain relief  Follow up 2 weeks after injection to discuss results

## 2023-10-22 ENCOUNTER — Encounter: Payer: Self-pay | Admitting: Speech Pathology

## 2023-10-22 ENCOUNTER — Ambulatory Visit: Payer: Medicare Other | Attending: Family Medicine | Admitting: Speech Pathology

## 2023-10-22 DIAGNOSIS — R4701 Aphasia: Secondary | ICD-10-CM | POA: Insufficient documentation

## 2023-10-22 DIAGNOSIS — R482 Apraxia: Secondary | ICD-10-CM | POA: Insufficient documentation

## 2023-10-22 NOTE — Patient Instructions (Signed)
   Encourage Kelsey Jacobs to write a clue word or related word by asking her questions   If you know what she means but want to help her get it out, ask a question that you know is wrong ( if she can write or say restaurant, ask is it a store? Or is it a church? ) this helps her understand how to say or write a cue  Have the white board available even when she is out and about  When she writes the wrong word, try to get another word - use  1. 2. 3.  To see if she can get her point across with gestures, writing, talking - all are are acceptable ways to communicate

## 2023-10-22 NOTE — Therapy (Signed)
OUTPATIENT SPEECH LANGUAGE PATHOLOGY APHASIA TREATMENT   Patient Name: Kelsey Jacobs MRN: [id number] DOB:1946/01/10, 77 y.o., female Today's Date: 10/22/2023  PCP: Alain Honey MD REFERRING PROVIDER: Basilia Jumbo, FNP  END OF SESSION:  End of Session - 10/22/23 1402     Visit Number 10    Number of Visits 25    Date for SLP Re-Evaluation 11/17/23    Authorization Type medicare    SLP Start Time 1400    SLP Stop Time  1445    SLP Time Calculation (min) 45 min    Activity Tolerance Patient tolerated treatment well               Past Medical History:  Diagnosis Date   Medical history non-contributory    Past Surgical History:  Procedure Laterality Date   BACK SURGERY     FOOT SURGERY     INCISION AND DRAINAGE ABSCESS Left 03/29/2021   Procedure: INCISION AND DRAINAGE AXILLARY ABSCESS;  Surgeon: Griselda Miner, MD;  Location: [facility];  Service: General;  Laterality: Left;   Patient Active Problem List   Diagnosis Date Noted   H/O Spinal surgery 08/01/2023   Spinal stenosis of lumbar region with neurogenic claudication 08/01/2023   HTN (hypertension) 11/11/2022   Impaired mobility and ADLs 11/11/2022   Cerebrovascular accident (CVA) due to stenosis of left middle cerebral artery (HCC) 11/08/2022   S/P total left hip arthroplasty 10/28/2022   Benign essential HTN 07/26/2022   Hypercholesterolemia 07/26/2022   Axillary abscess 03/30/2021   [name] (obstructive sleep apnea) 07/18/2020    ONSET DATE: 07/23/2023(referral date)   REFERRING DIAG: H60.737 (ICD-10-CM) - Arterial ischemic stroke, MCA (middle cerebral artery), left, acute (HCC) Z74.09 (ICD-10-CM) - Other reduced mobility Z78.9 (ICD-10-CM) - Other specified health status I69.320 (ICD-10-CM) - Aphasia following cerebral infarction  THERAPY DIAG:  Aphasia  Verbal apraxia  Rationale for Evaluation and Treatment: Rehabilitation  SUBJECTIVE:   SUBJECTIVE  Pt's sister attends session today Pt  accompanied by: significant other husband Al and her sister, Thayer Ohm  PERTINENT HISTORY: Pt well known to [place] from prior course of therapy s/p CVA 11/08/22. She returns due to significant frustration with aphasia affecting communication. 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 Second stroke. She received IPR at [facility]. Prior working as Advertising account planner, independent.  PAIN:  Are you having pain? No  FALLS: Has patient fallen in last 6 months?  No  LIVING ENVIRONMENT: Lives with: lives with their spouse Lives in: House/apartment  PLOF:  Level of assistance: Independent with ADLs Employment: Retired  PATIENT GOALS: "I don't know"  OBJECTIVE:    STANDARDIZED ASSESSMENTS: QAB: Severe 3.44/10   TODAY'S TREATMENT:                                                                                                                                         DATE:  10/22/23: Her sister, Thayer Ohm, attended this session with Al. Demonstrated use of white board to generate written cues at word level when [name] can't find a word. Today, she had difficulty finding the word "[employer]" when asked where she got her jacket. With white board and numbering 1-4, Suzann generated 4 words to cue "[employer]" with usual min questioning cues. Al and Thayer Ohm report that Lesli's success using white board and related words has improved her communication accuracy from 25% on eval to 50%. Demonstrated and trained Thayer Ohm in use of foil type questions (ie: asking the wrong question to elicit a salient written word) for example, Rebacca responds to "Is it a church" rather than "What type of place is it" to elicit her to say or write "restaurant" Thayer Ohm demonstrated understanding of this. Encouraged Thayer Ohm to use white board as well to help support Dorthie's auditory comprehension as well. Encouraged them to bring the white board out in the community to allow [place] access to written communication when she is outside of the home.   10/08/23:  Targeted multimodal communication in conversation re: her hobbies of wine, planning a trip to [place]. To communicate where she met friends she is seeing this weekend, Elizabe required min verbal cues to reduce frustration and written foil questions (church or work) to generate written "wine bar" She used written word to help her convey she wants to do a river cruise and used gestures to indicate her preference for smaller crowds with mod I. With frequent semantic cues and occasional written cues (fill in blank) Sallyanne generated 6 cities she would visit. Her sister is to attend next session.    10/06/23: Shalisa and Al continue to endorse that Aeryal is using written and verbal cues to help repair communication breakdown. Today, she had trouble naming [name] and stuffing in conversation. With usual min to mod verbal cues, Penne generated 4-5 written or verbal cues to assist in communicating her thought. In structured task, Louva generated 6 items she served at her Christmas open house and Thanksgiving dinner. She required verbal and visual cues to ID and correct written errors that were not approximations of the word she meant to write. Milaysia continues multiple attempts to complete her message with trials of written words with rare min encouragement. The last 3 sessions she has not refused to continue when frustrated. Family reports reduced episodes where she quits communicating due to frustration at home as well. Son present last 5 minutes of session. Ongoing encouragement to use white board with other family members and encouraging Dallis's sister to attend a session for training.    10/01/23: Lucy used [employer] a few times on vacation if needed with success to augment verbal communication. She required frequent questioning cues to write 3 related words/cues re: where her dog went swimming  and what she ate in conversation re: her trip. Structured tasks targeting generating (verbal or written) descriptions/cues at word level re:  presidential candidates, voting and famous politicians. With frequent questioning cues, Cherity generated 3-4 cues for 4 presidents/candidates at word level. She required visual cues to self correct written errors that did not approximate the word she meant. She and Al continue to endorse use of white board/written cues to augment verbal communication at home.   09/22/23: Multimodal communication generating 3-4 written or verbal cues for mid frequency word with frequent to usual verbal, written cues for 5 words. Al and Zayleah report improved success using written communication and self correcting with related words if Glenda's attempt  is not successful. Trained in caregiver use of written key words to A [place] in comprehending and communicating in conversations, particularly group conversations. In conversation, Faryal used written words to augment conversation telling grad student about her upcoming trip - she used gestures 3x, written cues to relay 4 pieces of information re: upcoming trip.   09/17/23: Targeted multimodal communication using writing - related word pairs for  example lasagna vs bolongese generating salient differences with frequent max written, questioning cues to generate 4-5 salient differences. She continues to benefit from foil questions to assist her auditory comprehension of "wh" questioning cues. In conversation targeting multimodal communication, Marneisha generated 3 facts about her new puppy with frequent mod to max questioning, foil and written cues- Maloree used writing at word level to augment verbal communication.   09/15/23: Earlee's son  [name] attended session today. Trained Merchant navy officer and Kyren in use of multimodal communication with compensations for aphasia - used visual cue of numbering 1-4 to assist Maeva in organizing written cues for aphasia. In structured task using mid to low frequency photos and SFA (semantic feature analysis) Takyra required frequent mod verbal, questioning, written and foil cues to generate  verbally or written, 3 related words/cues for 4 photos. Demonstrated use of foil questions to A with auditory comprehension and semantic cues. When Layana attempted gestures, modeling and feedback was required to mold a more salient gesture. She used gesture for "pyramid" and "harp" Encouraged [name] to accept gestures drawing verbal and written communication in conversations. In structured conversation re: where she is taking her vacation and what she will do there, Andraea successfully wrote town and state name, she required questioning cues to generate 3 things she will do for example, "will they cook every night?" To generate names of 2 restaurants she will eat at. [name] demonstrated use of foil questions to A in eliciting salient compensations for aphasia as well as use of written cues to support Sayre's auditory comprehension. Encouraged him to use written cues when Tamaris doesn't understand his questions or topics in conversation. Educated [name] that goal is for Tangerine to use written/multimodal communication with a variety of communication partners. Annisa's sister plans to attend a session as well.   09/10/23: Some semantic feature analysis completed, however frustration leads to stopping the work at home. Targeted verbal/written compensations for aphasia in structured conversation. Unable to say "[place]" she approximated it written - with verbal cues to write 3 words/cue about [place], she wrote "sand" and "[place]" independently. To communicate what he son brought her back from his trip, she wrote [employer] - with questioning cues, including foil questions to augment auditory comprehension she wrote [place] and "[employer]" for sponge - Foil questions:"is it a fish" "is it a rock" to [employer] in writing/saying salient cues/words. Instructed Al to use foil questions at home if she is not understanding his "wh" questions he is using to facilitate her using related words as cues to get her message out. 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 She frequently verbalized "I don't know" with frustration and tearfulness through out these tasks, however with encouragement she persisted until the information was communicated. Teyla is not using writing to communicate with her sisters or friends. Al endorses he is carrying the conversations for her with family/friends. Brizeyda Id'd her sister Thayer Ohm as a person she could try writing  on white board to augment conversation. I requested Thayer Ohm attend a session if able. We discussed goal would be to use white board to augment verbal communication outside of just spouse/son.   08/25/23 (eval day): reviewed goals and frustration tolerance affecting verbal expression   PATIENT EDUCATION: Education details: See Today's Treatment; See patient instructions Person educated: Patient and Spouse Education method: Explanation and Verbal cues Education comprehension: verbalized understanding, verbal cues required, and needs further education   GOALS: Goals reviewed with patient? Yes  SHORT TERM GOALS: Target date: 09/22/23  Pt will name 7 items in personally relevant category with occasional min A Baseline: 2 on eval with semantic cues Goal status: MET  2.  Pt will use strategies to complete structured language task despite frustration with occasional min A Baseline: aborts tasks Goal status: MET  3.  Pt will write a word level with 80% accuracy and occasional min A  Baseline:  Goal status: MET  4.  Pt will ID errors in written word and self correct or write an alternative key word to successfully augment verbal message with occasional min A Baseline:  Goal status:PARTIALLY MET  5. Pt and caregivers will use semantic feature analysis strategies to support and repair communication breakdowns in written attempts to augment verbal  communication with usual mod A           Goal Status: MET  LONG TERM GOALS: Target date: 11/17/23  Pt will use multimodal communication to make wants/needs/questions known 4/5 attempts with occasional min A Baseline:  Goal status: MET  2.  Pt will use strategies to manage frustration during aphasic/apraxic errors to complete message rather than aborting and  crying 4/5 attempts with occasional min A Baseline:  Goal status: MET  3.  Pt will generate simple sentences with compensatory strategies 3/4 turns in conversation with occasional min A Baseline:  Goal status: NOT MET  4.  Pt will ID error and correct or adapt written communication when breakdown occurs with occasional min A Baseline:  Goal status: ONGOING   ASSESSMENT:  CLINICAL IMPRESSION: Patient is a 77 y.o. female who was seen today for severe aphasia and verbal apraxia. With semantic feature questioning cues from family, Jull is using writing at word level to augment verbaI expression. When she can't find a word, with questions, Shauntell can generate 3-4 related words to give family cues to help complete her message. Frustration resulting in aborting her message has reduced to none in ST with minimal encouragement. Family also reports she is managing frustration and not quitting her message at home as well. I continue to encourage Kenetra and her family to use her white board with more people, including her sisters and some friends. Irecommend skilled ST to maximize multimodal communication for safety independence and QOL as well as reduce caregiver burden. .   OBJECTIVE IMPAIRMENTS: include aphasia and apraxia. These impairments are limiting patient from return to work, household responsibilities, and effectively communicating at home and in community. Factors affecting potential to achieve goals and functional outcome are severity of impairments. Patient will benefit from skilled SLP services to address above impairments and improve  overall function.  REHAB POTENTIAL: Good  PLAN:  SLP FREQUENCY: 2x/week  SLP DURATION: 12 weeks  PLANNED INTERVENTIONS: Language facilitation, Environmental controls, Cueing hierachy, Cognitive reorganization, Internal/external aids, Functional tasks, and Multimodal communication approach  Speech Therapy Progress Note  Dates of Reporting Period: 08/25/23 to 10/22/23  Objective Reports of Subjective Statement: Spouse and sister report Selena's communicative success  using writing at word level to augment verbal attempts improved her communication success from 25% at eval to 50%. Arnette has reduced aborting communicative attempts when she gets frustrated to 1 or less times a week (over 2 sessions). Improved from 6+ aborted messages at eval with tearfulness and fist shaking. Family reports she has managed frustration better at home when she encounters communication breakdown.   Objective Measurements: See goals  Goal Update: Continue goals  Plan: Continue POC  Reason Skilled Services are Required: 1-2 more sessions for ongoing family training in best ways to cue [name] to generate related words/salient cues both written and verbal.    Anaria Kroner, Yoselin Peed, CCC-SLP 10/22/2023, 2:03 PM

## 2023-10-27 ENCOUNTER — Encounter: Payer: Self-pay | Admitting: Speech Pathology

## 2023-10-27 ENCOUNTER — Ambulatory Visit: Payer: Medicare Other | Admitting: Speech Pathology

## 2023-10-27 DIAGNOSIS — R4701 Aphasia: Secondary | ICD-10-CM | POA: Diagnosis not present

## 2023-10-27 DIAGNOSIS — R482 Apraxia: Secondary | ICD-10-CM

## 2023-10-27 NOTE — Patient Instructions (Signed)
   [facility]  [facility] [phone]  [email or web address]

## 2023-10-27 NOTE — Therapy (Signed)
OUTPATIENT SPEECH LANGUAGE PATHOLOGY APHASIA TREATMENT & DISCHARGE SUMMARY   Patient Name: Kelsey Jacobs MRN: [id number] DOB:02-14-1946, 77 y.o., female Today's Date: 10/27/2023  PCP: Alain Honey MD REFERRING PROVIDER: Basilia Jumbo, FNP  END OF SESSION:  End of Session - 10/27/23 1400     Visit Number 11    Number of Visits 25    Date for SLP Re-Evaluation 11/17/23    Authorization Type medicare    SLP Start Time 1400    SLP Stop Time  1445    SLP Time Calculation (min) 45 min    Activity Tolerance Patient tolerated treatment well               Past Medical History:  Diagnosis Date   Medical history non-contributory    Past Surgical History:  Procedure Laterality Date   BACK SURGERY     FOOT SURGERY     INCISION AND DRAINAGE ABSCESS Left 03/29/2021   Procedure: INCISION AND DRAINAGE AXILLARY ABSCESS;  Surgeon: Griselda Miner, MD;  Location: [facility];  Service: General;  Laterality: Left;   Patient Active Problem List   Diagnosis Date Noted   H/O Spinal surgery 08/01/2023   Spinal stenosis of lumbar region with neurogenic claudication 08/01/2023   HTN (hypertension) 11/11/2022   Impaired mobility and ADLs 11/11/2022   Cerebrovascular accident (CVA) due to stenosis of left middle cerebral artery (HCC) 11/08/2022   S/P total left hip arthroplasty 10/28/2022   Benign essential HTN 07/26/2022   Hypercholesterolemia 07/26/2022   Axillary abscess 03/30/2021   [name] (obstructive sleep apnea) 07/18/2020    ONSET DATE: 07/23/2023(referral date)   REFERRING DIAG: W09.811 (ICD-10-CM) - Arterial ischemic stroke, MCA (middle cerebral artery), left, acute (HCC) Z74.09 (ICD-10-CM) - Other reduced mobility Z78.9 (ICD-10-CM) - Other specified health status I69.320 (ICD-10-CM) - Aphasia following cerebral infarction  THERAPY DIAG:  Aphasia  Verbal apraxia  Rationale for Evaluation and Treatment: Rehabilitation  SUBJECTIVE:   SUBJECTIVE  Pt's sister attends  session today Pt accompanied by: significant other husband Al and her sister, Thayer Ohm  PERTINENT HISTORY: Pt well known to [place] from prior course of therapy s/p CVA 11/08/22. She returns due to significant frustration with aphasia affecting communication. 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 Second stroke. She received IPR at [facility]. Prior working as Advertising account planner, independent.  PAIN:  Are you having pain? No  FALLS: Has patient fallen in last 6 months?  No  LIVING ENVIRONMENT: Lives with: lives with their spouse Lives in: House/apartment  PLOF:  Level of assistance: Independent with ADLs Employment: Retired  PATIENT GOALS: "I don't know"  OBJECTIVE:    STANDARDIZED ASSESSMENTS: QAB: Severe 3.44/10   TODAY'S TREATMENT:                                                                                                                                         DATE:  10/27/23: Today, Cuca used written communication at word level to augment verbal expression to converse re: holiday plans. She successfully communicated 5 details re: her holiday events, decorations and preferences using multimodal communication. With gestures and verbal approximations she asked me 3 questions re: my holidays and travel plans. Maisley took 7 turns in conversation with occasional min A. Attempts to write more than 2 words continue to required consistent max A.    10/22/23: Her sister, Thayer Ohm, attended this session with Al. Demonstrated use of white board to generate written cues at word level when [name] can't find a word. Today, she had difficulty finding the word "[employer]" when asked where she got her jacket. With white board and numbering 1-4, Jyl generated 4 words to cue "[employer]" with usual min questioning cues. Al and Thayer Ohm report that Marbeth's success using white board and related words has improved her communication accuracy from 25% on eval to 50%. Demonstrated and trained Thayer Ohm in use of foil  type questions (ie: asking the wrong question to elicit a salient written word) for example, Harriet responds to "Is it a church" rather than "What type of place is it" to elicit her to say or write "restaurant" Thayer Ohm demonstrated understanding of this. Encouraged Thayer Ohm to use white board as well to help support Ranie's auditory comprehension as well. Encouraged them to bring the white board out in the community to allow [place] access to written communication when she is outside of the home.   10/08/23: Targeted multimodal communication in conversation re: her hobbies of wine, planning a trip to [place]. To communicate where she met friends she is seeing this weekend, Phiona required min verbal cues to reduce frustration and written foil questions (church or work) to generate written "wine bar" She used written word to help her convey she wants to do a river cruise and used gestures to indicate her preference for smaller crowds with mod I. With frequent semantic cues and occasional written cues (fill in blank) Meigan generated 6 cities she would visit. Her sister is to attend next session.    10/06/23: Kyla and Al continue to endorse that Panayiota is using written and verbal cues to help repair communication breakdown. Today, she had trouble naming [name] and stuffing in conversation. With usual min to mod verbal cues, Lela generated 4-5 written or verbal cues to assist in communicating her thought. In structured task, [place]aclara generated 6 items she served at her Christmas open house and Thanksgiving dinner. She required verbal and visual cues to ID and correct written errors that were not approximations of the word she meant to write. Keagen continues multiple attempts to complete her message with trials of written words with rare min encouragement. The last 3 sessions she has not refused to continue when frustrated. Family reports reduced episodes where she quits communicating due to frustration at home as well. Son present last 5 minutes  of session. Ongoing encouragement to use white board with other family members and encouraging Eimy's sister to attend a session for training.    10/01/23: Dominique used [employer] a few times on vacation if needed with success to augment verbal communication. She required frequent questioning cues to write 3 related words/cues re: where her dog went swimming  and what she ate in conversation re: her trip. Structured tasks targeting generating (verbal or written) descriptions/cues at word level re: presidential candidates, voting and famous politicians. With frequent questioning cues, Yeng generated 3-4 cues for 4 presidents/candidates at word level. She required  visual cues to self correct written errors that did not approximate the word she meant. She and Al continue to endorse use of white board/written cues to augment verbal communication at home.   09/22/23: Multimodal communication generating 3-4 written or verbal cues for mid frequency word with frequent to usual verbal, written cues for 5 words. Al and Alyana report improved success using written communication and self correcting with related words if Avarie's attempt is not successful. Trained in caregiver use of written key words to A Kinzley in comprehending and communicating in conversations, particularly group conversations. In conversation, Emirah used written words to augment conversation telling grad student about her upcoming trip - she used gestures 3x, written cues to relay 4 pieces of information re: upcoming trip.   09/17/23: Targeted multimodal communication using writing - related word pairs for  example lasagna vs bolongese generating salient differences with frequent max written, questioning cues to generate 4-5 salient differences. She continues to benefit from foil questions to assist her auditory comprehension of "wh" questioning cues. In conversation targeting multimodal communication, Reia generated 3 facts about her new puppy with frequent mod to  max questioning, foil and written cues- Teandra used writing at word level to augment verbal communication.   09/15/23: Shayann's son  [name] attended session today. Trained Merchant navy officer and Aviyah in use of multimodal communication with compensations for aphasia - used visual cue of numbering 1-4 to assist Milda in organizing written cues for aphasia. In structured task using mid to low frequency photos and SFA (semantic feature analysis) Elizabet required frequent mod verbal, questioning, written and foil cues to generate verbally or written, 3 related words/cues for 4 photos. Demonstrated use of foil questions to A with auditory comprehension and semantic cues. When Deaven attempted gestures, modeling and feedback was required to mold a more salient gesture. She used gesture for "pyramid" and "harp" Encouraged [name] to accept gestures drawing verbal and written communication in conversations. In structured conversation re: where she is taking her vacation and what she will do there, Cherese successfully wrote town and state name, she required questioning cues to generate 3 things she will do for example, "will they cook every night?" To generate names of 2 restaurants she will eat at. [name] demonstrated use of foil questions to A in eliciting salient compensations for aphasia as well as use of written cues to support Lexianna's auditory comprehension. Encouraged him to use written cues when Ashby doesn't understand his questions or topics in conversation. Educated [name] that goal is for Aleesia to use written/multimodal communication with a variety of communication partners. Giulietta's sister plans to attend a session as well.   09/10/23: Some semantic feature analysis completed, however frustration leads to stopping the work at home. Targeted verbal/written compensations for aphasia in structured conversation. Unable to say "[place]" she approximated it written - with verbal cues to write 3 words/cue about [place], she wrote "sand" and "[place]" independently. To  communicate what he son brought her back from his trip, she wrote [employer] - with questioning cues, including foil questions to augment auditory comprehension she wrote [place] and "[place]" for sponge - Foil questions:"is it a fish" "is it a rock" to [employer] in writing/saying salient cues/words. Instructed Al to use foil questions at home if she is not understanding his "wh" questions he is using to facilitate her using related words as cues to get her message out. 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 She frequently verbalized "I don't know" with frustration and tearfulness through out these tasks, however with encouragement she persisted until the information was communicated. Inocencia is not using writing to communicate with her sisters or friends. Al endorses he is carrying the conversations for her with family/friends. Magdalen Id'd her sister Thayer Ohm as a person she could try writing on white board to augment conversation. I requested Thayer Ohm attend a session if able. We discussed goal would be to use white board to augment verbal communication outside of just spouse/son.   08/25/23 (eval day): reviewed goals and frustration tolerance affecting verbal expression   PATIENT EDUCATION: Education details: See Today's Treatment; See patient instructions Person educated: Patient and Spouse Education method: Explanation and Verbal cues Education comprehension: verbalized understanding, verbal cues required, and needs further education   GOALS: Goals reviewed with patient? Yes  SHORT TERM GOALS: Target date: 09/22/23  Pt will name 7 items in personally relevant category with occasional min A Baseline: 2 on eval with semantic cues Goal status: MET  2.  Pt will use strategies to complete structured language task despite frustration with occasional  min A Baseline: aborts tasks Goal status: MET  3.  Pt will write a word level with 80% accuracy and occasional min A  Baseline:  Goal status: MET  4.  Pt will ID errors in written word and self correct or write an alternative key word to successfully augment verbal message with occasional min A Baseline:  Goal status:PARTIALLY MET  5. Pt and caregivers will use semantic feature analysis strategies to support and repair communication breakdowns in written attempts to augment verbal communication with usual mod A           Goal Status: MET  LONG TERM GOALS: Target date: 11/17/23  Pt will use multimodal communication to make wants/needs/questions known 4/5 attempts with occasional min A Baseline:  Goal status: MET  2.  Pt will use strategies to manage frustration during aphasic/apraxic errors to complete message rather than aborting and  crying 4/5 attempts with occasional min A Baseline:  Goal status: MET  3.  Pt will generate simple sentences with compensatory strategies 3/4 turns in conversation with occasional min A Baseline:  Goal status: NOT MET  4.  Pt will ID error and correct or adapt written communication when breakdown occurs with occasional min A Baseline:  Goal status: PARTIALLY MET (usual min A)   ASSESSMENT:  CLINICAL IMPRESSION: Patient is a 77 y.o. female who was seen today for severe aphasia and verbal apraxia. With semantic feature questioning cues from family, Aanvi is using writing at word level to augment verbaI expression. When she can't find a word, with questions, Marycruz can generate 3-4 related words to give family cues to help complete her message. Frustration resulting in aborting her message has reduced to none in ST with minimal encouragement. Family also reports she is managing frustration and not quitting her message at home as well. I continue to encourage Ameyalli and her family to use her white board with more people, including her sisters and some friends.  She has improved from spouse rating her written communication 25% successful to 50% successful subjectively. D/C ST at this time, pt and family in agreement.   OBJECTIVE IMPAIRMENTS: include aphasia and apraxia. These impairments are limiting patient from return to work, household responsibilities, and effectively communicating at home and in community. Factors affecting potential  to achieve goals and functional outcome are severity of impairments. Patient will benefit from skilled SLP services to address above impairments and improve overall function.  REHAB POTENTIAL: Good  PLAN:  SLP FREQUENCY: 2x/week  SLP DURATION: 12 weeks  PLANNED INTERVENTIONS: Language facilitation, Environmental controls, Cueing hierachy, Cognitive reorganization, Internal/external aids, Functional tasks, and Multimodal communication approach  SPEECH THERAPY DISCHARGE SUMMARY  Visits from Start of Care: 11  Current functional level related to goals / functional outcomes: See goals above   Remaining deficits: Severe non fluent aphasia   Education / Equipment: Multimodal communication, written/verbal compensations for aphasia   Patient agrees to discharge. Patient goals were partially met. Patient is being discharged due to meeting the stated rehab goals.Dara Hoyer, CCC-SLP 10/27/2023, 2:20 PM

## 2023-10-28 NOTE — Discharge Instructions (Signed)

## 2023-10-29 ENCOUNTER — Inpatient Hospital Stay
Admission: RE | Admit: 2023-10-29 | Discharge: 2023-10-29 | Disposition: A | Discharge status: Home / Self Care | Payer: Medicare Other | Source: Ambulatory Visit | Attending: Sports Medicine

## 2023-10-29 ENCOUNTER — Other Ambulatory Visit: Payer: Self-pay | Admitting: Sports Medicine

## 2023-10-29 DIAGNOSIS — Z96642 Presence of left artificial hip joint: Secondary | ICD-10-CM

## 2023-10-29 DIAGNOSIS — G8929 Other chronic pain: Secondary | ICD-10-CM

## 2023-10-29 DIAGNOSIS — M25552 Pain in left hip: Secondary | ICD-10-CM

## 2023-10-29 MED ORDER — METHYLPREDNISOLONE ACETATE 40 MG/ML INJ SUSP (RADIOLOG
80.0000 mg | Freq: Once | INTRAMUSCULAR | Status: AC
  Administered 2023-10-29: 80 mg via EPIDURAL

## 2023-10-29 MED ORDER — IOPAMIDOL (ISOVUE-M 200) INJECTION 41%
1.0000 mL | Freq: Once | INTRAMUSCULAR | Status: AC
  Administered 2023-10-29: 1 mL via EPIDURAL

## 2023-11-11 NOTE — Progress Notes (Unsigned)
    Aleen Sells D.[facility] [address] [place] [address] Phone: [phone]   Assessment and Plan:     There are no diagnoses linked to this encounter.  ***   Pertinent previous records reviewed include ***    Follow Up: ***     Subjective:   I, Kylea Berrong, am serving as a Neurosurgeon for Doctor Richardean Sale   Chief Complaint: leg pain   HPI:    10/20/2023 Patient is a 77 year old female with concerns of leg pain. Patient states hx of a stroke on the left side . Left leg pain hx of hip replacement 2023. Dull left achy pain. Antalgic gait . Is able to keep moving through the day . Is in PT. A year of pain. She is tight all the way around. Isnt able to sleep through the night . Has been using gabapentin   11/12/2023 Patient states    Relevant Historical Information: History of CVA, hypertension, total left hip replacement, spinal stenosis  Additional pertinent review of systems negative.  No current outpatient medications on file.   Objective:     There were no vitals filed for this visit.    There is no height or weight on file to calculate BMI.    Physical Exam:    ***   Electronically signed by:  Aleen Sells D.[facility] 4:41 PM 11/11/23

## 2023-11-12 ENCOUNTER — Ambulatory Visit: Payer: Medicare Other | Admitting: Sports Medicine

## 2023-11-12 VITALS — HR 67 | Ht 62.0 in | Wt 178.0 lb

## 2023-11-12 DIAGNOSIS — Z96642 Presence of left artificial hip joint: Secondary | ICD-10-CM

## 2023-11-12 DIAGNOSIS — M25552 Pain in left hip: Secondary | ICD-10-CM

## 2023-11-12 DIAGNOSIS — G8929 Other chronic pain: Secondary | ICD-10-CM

## 2023-11-12 DIAGNOSIS — M5442 Lumbago with sciatica, left side: Secondary | ICD-10-CM | POA: Diagnosis not present

## 2023-11-12 NOTE — Patient Instructions (Signed)
Continue Tylenol [phone] mg 2-3 times a day for pain relief  Try different sleeping positions to see what is comfortable  Nerve conduction study referral  Follow up 5 days after nerve conduction study

## 2024-01-01 IMAGING — CR DG HIP (WITH OR WITHOUT PELVIS) 2-3V*L*
3 series · 3 of 3 positions shown · non-contrast
Comparison: None.

CLINICAL DATA: Left hip pain. Decreased range of motion. Patient
reports pain for 1 month.

EXAM:
DG HIP (WITH OR WITHOUT PELVIS) 2-3V LEFT

[w pelvis upright]
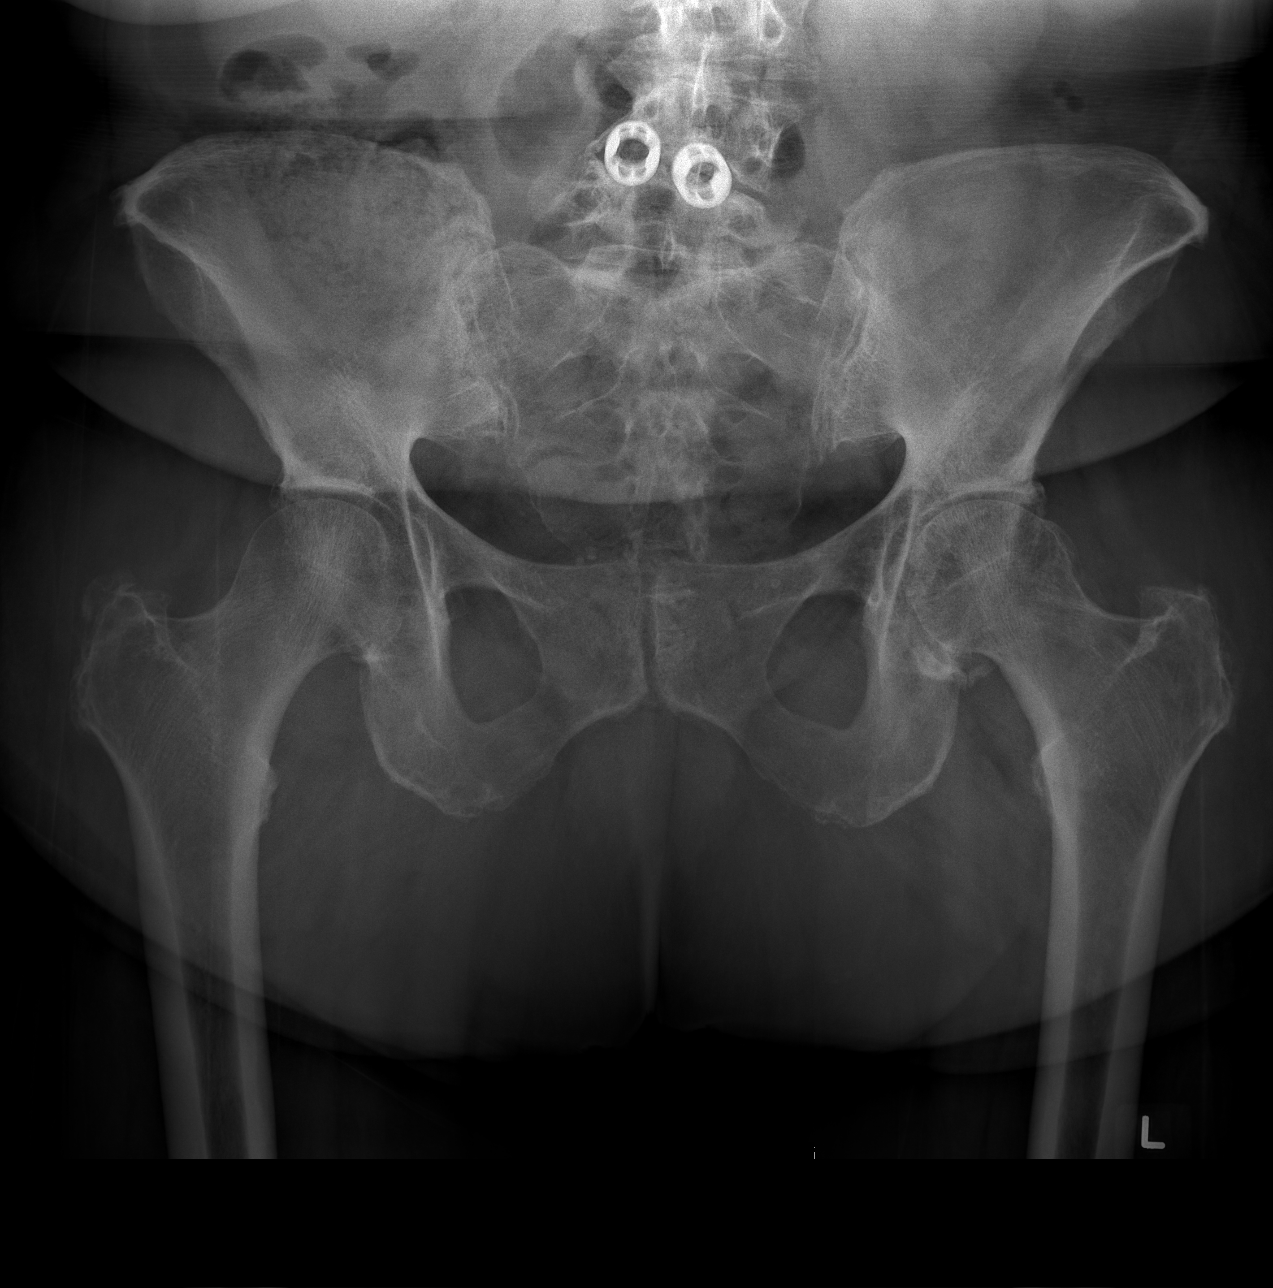

[w hip ap left]
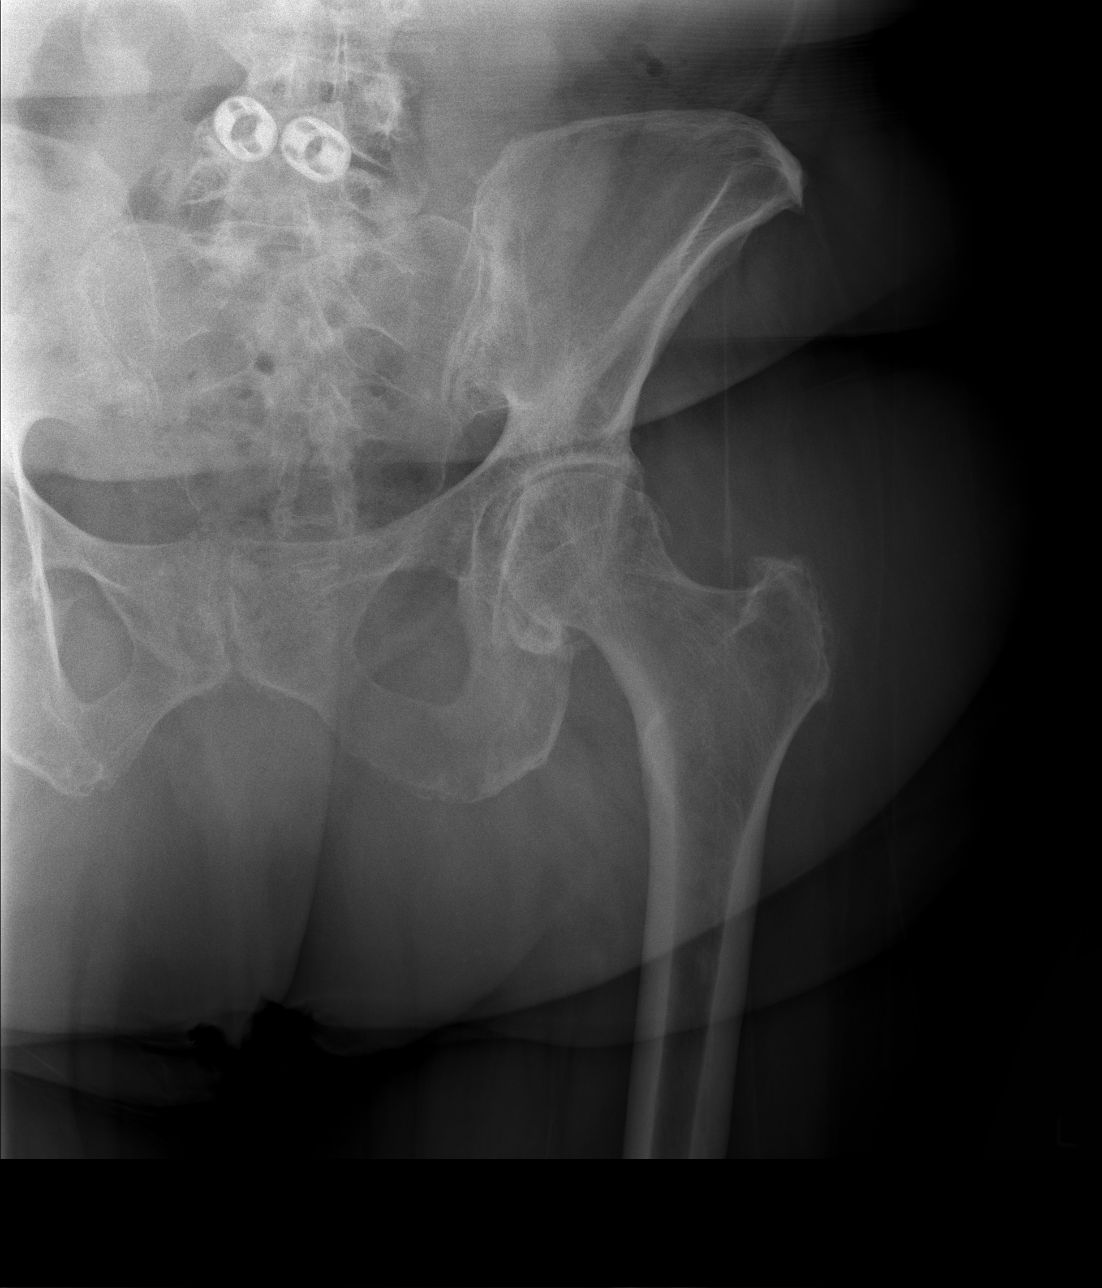

[w hip lat left]
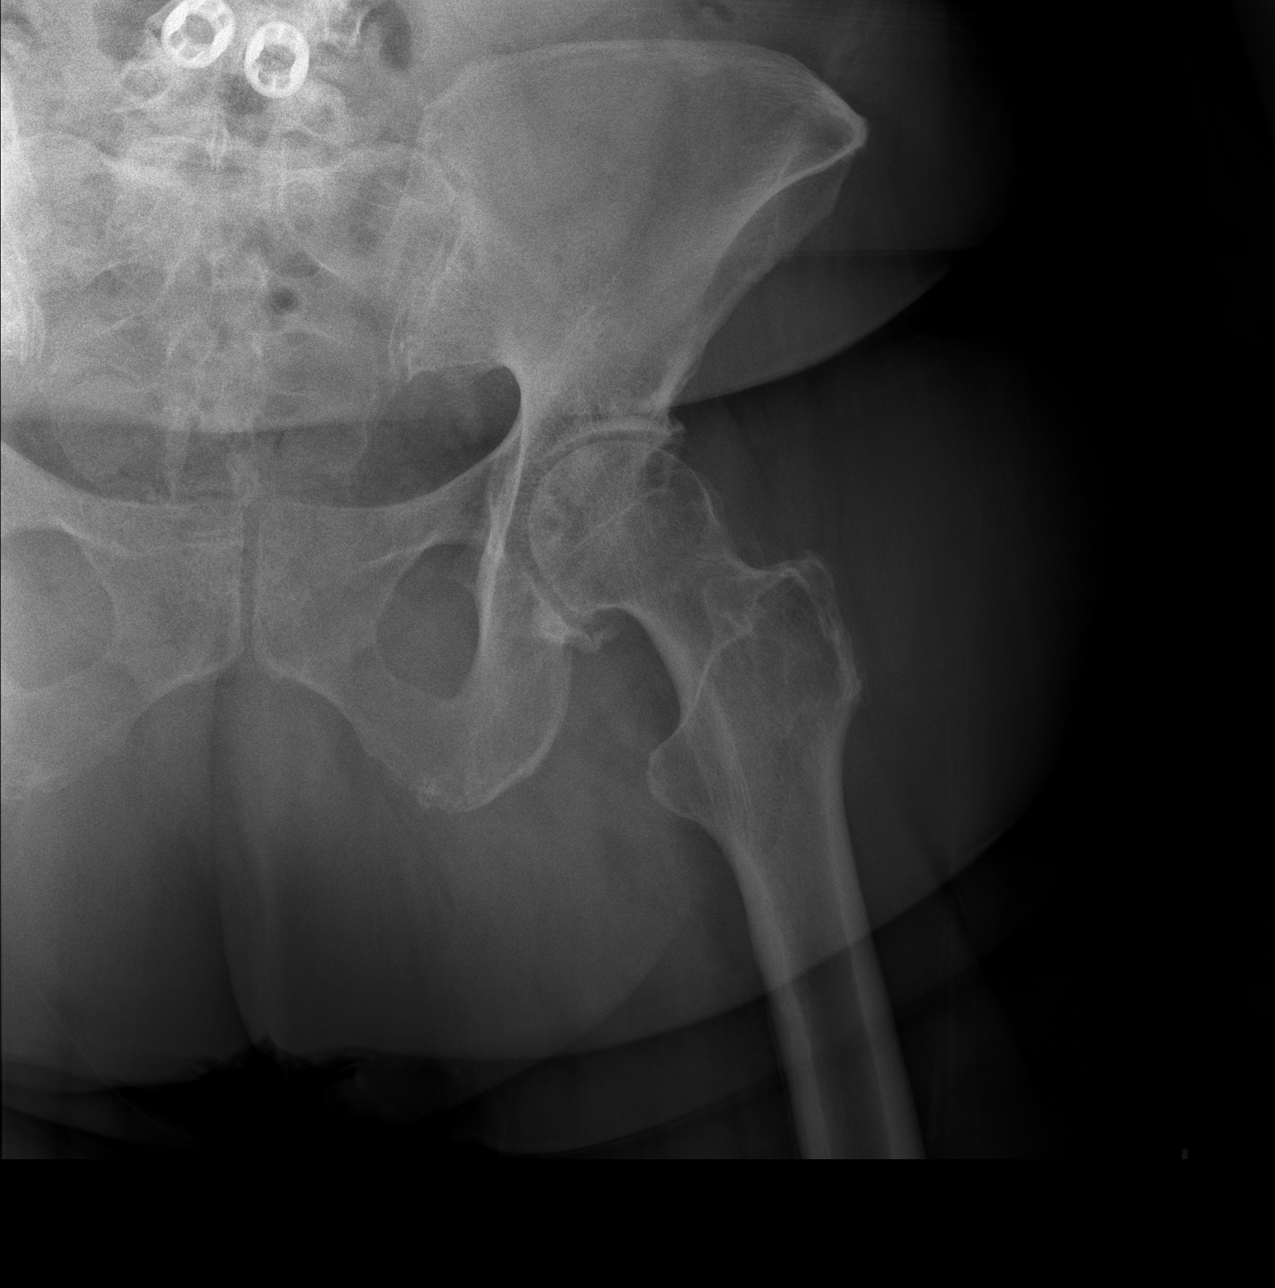

[3 of 3 positions shown; findings below may reference images not displayed]

FINDINGS: Moderate left hip joint space narrowing. There is acetabular and
femoral head neck spurring. The femoral head is seated in the
acetabulum. No fracture, erosion, or evidence of avascular necrosis.
Bony pelvis is intact. Pubic rami are intact. No evidence of focal
bone lesion or bone destruction. There is mild degenerative change
of both sacroiliac joints which are congruent. Enthesopathic change
about the greater trochanter. Scoliosis and postsurgical change in
the included lower lumbar spine.
IMPRESSION: Moderate osteoarthritis of the left hip.
